# Patient Record
Sex: Female | Born: 1955
Health system: Southern US, Community
[De-identification: ages and names within clinical notes are randomized; demographics above are authoritative.]

## PROBLEM LIST (undated history)

## (undated) DIAGNOSIS — E739 Lactose intolerance, unspecified: Secondary | ICD-10-CM

## (undated) DIAGNOSIS — M069 Rheumatoid arthritis, unspecified: Secondary | ICD-10-CM

## (undated) DIAGNOSIS — M2550 Pain in unspecified joint: Secondary | ICD-10-CM

## (undated) DIAGNOSIS — K219 Gastro-esophageal reflux disease without esophagitis: Secondary | ICD-10-CM

## (undated) DIAGNOSIS — K5900 Constipation, unspecified: Secondary | ICD-10-CM

## (undated) DIAGNOSIS — I10 Essential (primary) hypertension: Secondary | ICD-10-CM

## (undated) DIAGNOSIS — M549 Dorsalgia, unspecified: Secondary | ICD-10-CM

## (undated) DIAGNOSIS — Z8489 Family history of other specified conditions: Secondary | ICD-10-CM

## (undated) DIAGNOSIS — M1990 Unspecified osteoarthritis, unspecified site: Secondary | ICD-10-CM

## (undated) DIAGNOSIS — R600 Localized edema: Secondary | ICD-10-CM

## (undated) DIAGNOSIS — M7989 Other specified soft tissue disorders: Secondary | ICD-10-CM

## (undated) DIAGNOSIS — Z91018 Allergy to other foods: Secondary | ICD-10-CM

## (undated) DIAGNOSIS — D649 Anemia, unspecified: Secondary | ICD-10-CM

## (undated) DIAGNOSIS — E559 Vitamin D deficiency, unspecified: Secondary | ICD-10-CM

## (undated) HISTORY — DX: Rheumatoid arthritis, unspecified: M06.9

## (undated) HISTORY — DX: Anemia, unspecified: D64.9

## (undated) HISTORY — DX: Unspecified osteoarthritis, unspecified site: M19.90

## (undated) HISTORY — DX: Allergy to other foods: Z91.018

## (undated) HISTORY — DX: Localized edema: R60.0

## (undated) HISTORY — DX: Constipation, unspecified: K59.00

## (undated) HISTORY — DX: Dorsalgia, unspecified: M54.9

## (undated) HISTORY — DX: Vitamin D deficiency, unspecified: E55.9

## (undated) HISTORY — DX: Pain in unspecified joint: M25.50

## (undated) HISTORY — DX: Lactose intolerance, unspecified: E73.9

## (undated) HISTORY — DX: Other specified soft tissue disorders: M79.89

## (undated) HISTORY — PX: FOOT SURGERY: SHX648

---

## 1997-03-25 HISTORY — PX: PARTIAL HYSTERECTOMY: SHX80

## 1998-04-20 ENCOUNTER — Ambulatory Visit (HOSPITAL_COMMUNITY): Admission: RE | Admit: 1998-04-20 | Discharge: 1998-04-20 | Payer: Self-pay | Admitting: Internal Medicine

## 1998-04-20 ENCOUNTER — Encounter: Payer: Self-pay | Admitting: Internal Medicine

## 2000-09-30 ENCOUNTER — Encounter: Admission: RE | Admit: 2000-09-30 | Discharge: 2000-12-29 | Payer: Self-pay | Admitting: Internal Medicine

## 2001-01-13 ENCOUNTER — Encounter: Payer: Self-pay | Admitting: Obstetrics and Gynecology

## 2001-01-13 ENCOUNTER — Ambulatory Visit (HOSPITAL_COMMUNITY): Admission: RE | Admit: 2001-01-13 | Discharge: 2001-01-13 | Payer: Self-pay | Admitting: Obstetrics and Gynecology

## 2001-02-02 ENCOUNTER — Encounter: Payer: Self-pay | Admitting: Internal Medicine

## 2001-02-02 ENCOUNTER — Ambulatory Visit (HOSPITAL_COMMUNITY): Admission: RE | Admit: 2001-02-02 | Discharge: 2001-02-02 | Payer: Self-pay | Admitting: Internal Medicine

## 2001-02-11 ENCOUNTER — Ambulatory Visit (HOSPITAL_COMMUNITY): Admission: RE | Admit: 2001-02-11 | Discharge: 2001-02-11 | Payer: Self-pay | Admitting: Internal Medicine

## 2001-02-11 ENCOUNTER — Encounter: Payer: Self-pay | Admitting: Internal Medicine

## 2001-03-10 ENCOUNTER — Encounter: Admission: RE | Admit: 2001-03-10 | Discharge: 2001-06-08 | Payer: Self-pay | Admitting: Internal Medicine

## 2001-06-04 ENCOUNTER — Emergency Department (HOSPITAL_COMMUNITY): Admission: EM | Admit: 2001-06-04 | Discharge: 2001-06-04 | Payer: Self-pay | Admitting: Emergency Medicine

## 2001-07-01 ENCOUNTER — Encounter: Payer: Self-pay | Admitting: Neurosurgery

## 2001-07-01 ENCOUNTER — Encounter: Admission: RE | Admit: 2001-07-01 | Discharge: 2001-07-01 | Payer: Self-pay | Admitting: Neurosurgery

## 2001-07-15 ENCOUNTER — Encounter: Admission: RE | Admit: 2001-07-15 | Discharge: 2001-07-15 | Payer: Self-pay | Admitting: Neurosurgery

## 2001-07-15 ENCOUNTER — Encounter: Payer: Self-pay | Admitting: Neurosurgery

## 2001-12-01 ENCOUNTER — Ambulatory Visit (HOSPITAL_COMMUNITY): Admission: RE | Admit: 2001-12-01 | Discharge: 2001-12-01 | Payer: Self-pay | Admitting: Internal Medicine

## 2003-07-15 ENCOUNTER — Ambulatory Visit (HOSPITAL_COMMUNITY): Admission: RE | Admit: 2003-07-15 | Discharge: 2003-07-15 | Payer: Self-pay | Admitting: Obstetrics and Gynecology

## 2004-01-27 ENCOUNTER — Encounter: Admission: RE | Admit: 2004-01-27 | Discharge: 2004-01-27 | Payer: Self-pay | Admitting: Neurosurgery

## 2004-09-09 ENCOUNTER — Emergency Department (HOSPITAL_COMMUNITY): Admission: EM | Admit: 2004-09-09 | Discharge: 2004-09-09 | Payer: Self-pay | Admitting: Emergency Medicine

## 2006-11-18 ENCOUNTER — Encounter: Admission: RE | Admit: 2006-11-18 | Discharge: 2006-11-18 | Payer: Self-pay | Admitting: Internal Medicine

## 2007-02-19 ENCOUNTER — Emergency Department (HOSPITAL_COMMUNITY): Admission: EM | Admit: 2007-02-19 | Discharge: 2007-02-20 | Payer: Self-pay | Admitting: Emergency Medicine

## 2007-02-25 ENCOUNTER — Ambulatory Visit (HOSPITAL_COMMUNITY): Admission: RE | Admit: 2007-02-25 | Discharge: 2007-02-25 | Payer: Self-pay | Admitting: Internal Medicine

## 2007-07-17 ENCOUNTER — Ambulatory Visit (HOSPITAL_COMMUNITY): Admission: RE | Admit: 2007-07-17 | Discharge: 2007-07-17 | Payer: Self-pay | Admitting: Surgery

## 2009-08-11 ENCOUNTER — Ambulatory Visit (HOSPITAL_COMMUNITY): Admission: RE | Admit: 2009-08-11 | Discharge: 2009-08-11 | Payer: Self-pay | Admitting: Internal Medicine

## 2010-04-14 ENCOUNTER — Encounter: Payer: Self-pay | Admitting: Orthopaedic Surgery

## 2010-04-15 ENCOUNTER — Encounter: Payer: Self-pay | Admitting: Internal Medicine

## 2010-06-19 ENCOUNTER — Ambulatory Visit (HOSPITAL_COMMUNITY): Payer: Self-pay | Admitting: Physical Therapy

## 2010-08-10 NOTE — Procedures (Signed)
[facility]  Patient:    Whitney Bauer, Whitney Bauer. Visit Number: [id number] MRN: [id number]          Service Type: Attending:  Carylon Perches, M.D. Dictated by:   Carylon Perches, M.D.                                Stress Test  DESCRIPTION OF PROCEDURE:  The patient exercised 4 minutes 54 seconds (1 minute 54 seconds into stage 2 of the Bruce protocol), attaining a maximal heart rate of 181 (102% of the age-predicted maximal heart rate) at a work load of 7 METs and discontinued exercise due to shortness of breath and fatigue.  There were no symptoms of chest pain.  There were no arrhythmias. There were no ST segment changes diagnostic of ischemia.  The baseline electrocardiogram revealed normal sinus rhythm and poor R wave progression.  IMPRESSION: 1. No evidence of exercise-induced ischemia. 2. Poor exercise tolerance. 3. Cardiolite images pending. Dictated by:   Carylon Perches, M.D. Attending:  Carylon Perches, M.D. DD:  02/11/01 TD:  02/11/01 Job: [id number]

## 2010-08-29 ENCOUNTER — Other Ambulatory Visit (HOSPITAL_COMMUNITY): Payer: Self-pay | Admitting: Internal Medicine

## 2010-08-29 ENCOUNTER — Ambulatory Visit (HOSPITAL_COMMUNITY)
Admission: RE | Admit: 2010-08-29 | Discharge: 2010-08-29 | Disposition: A | Discharge status: Home / Self Care | Payer: PRIVATE HEALTH INSURANCE | Source: Ambulatory Visit | Attending: Internal Medicine | Admitting: Internal Medicine

## 2010-08-29 ENCOUNTER — Encounter (HOSPITAL_COMMUNITY): Payer: Self-pay

## 2010-08-29 DIAGNOSIS — I10 Essential (primary) hypertension: Secondary | ICD-10-CM | POA: Insufficient documentation

## 2010-08-29 DIAGNOSIS — R0602 Shortness of breath: Secondary | ICD-10-CM | POA: Insufficient documentation

## 2010-08-29 HISTORY — DX: Essential (primary) hypertension: I10

## 2010-10-23 ENCOUNTER — Ambulatory Visit (HOSPITAL_COMMUNITY)
Admission: RE | Admit: 2010-10-23 | Discharge: 2010-10-23 | Disposition: A | Discharge status: Home / Self Care | Payer: PRIVATE HEALTH INSURANCE | Source: Ambulatory Visit | Attending: Internal Medicine | Admitting: Internal Medicine

## 2010-10-23 DIAGNOSIS — M6281 Muscle weakness (generalized): Secondary | ICD-10-CM | POA: Insufficient documentation

## 2010-10-23 DIAGNOSIS — M25569 Pain in unspecified knee: Secondary | ICD-10-CM | POA: Insufficient documentation

## 2010-10-23 DIAGNOSIS — R262 Difficulty in walking, not elsewhere classified: Secondary | ICD-10-CM | POA: Insufficient documentation

## 2010-10-23 DIAGNOSIS — IMO0001 Reserved for inherently not codable concepts without codable children: Secondary | ICD-10-CM | POA: Insufficient documentation

## 2010-10-23 NOTE — Progress Notes (Signed)
Physical Therapy Evaluation  Patient Name: Whitney Bauer Date: 10/23/2010 HPI: Pt with OA B knees that is interfering with her ADL's.  Pt has LE weakness B. Symptoms/Limitations Symptoms: I can not get into high places.  My left leg feels like it wants to give out every once in a while.  I have more pain in the right knee on the inside.  Pt states that she has had the weakness for a long time but the pain has just begun.  Pt states that she is ok walkint on level ground but if she goes on an incline than she can has increased pain.   How long can you stand comfortably?: twenty minutes anything over this her knees feel like her knees are going to give out. How long can you walk comfortably?: If it is level she can walk alright but any incline or steps the patient has problelms. Repetition: Increases Symptoms Pain Assessment Currently in Pain?: Yes Pain Score:   7 Pain Location: Knee Pain Orientation: Right Pain Type: Chronic pain Pain Onset: More than a month ago Pain Frequency: Intermittent Effect of Pain on Daily Activities: increases Multiple Pain Sites: Yes Past Medical History:  Past Medical History  Diagnosis Date  . Hypertension    Past Surgical History: No past surgical history on file.  Precautions/Restrictions     Prior Functioning  Home Living Type of Home: House Lives With: Alone Bathroom Shower/Tub: Tub/shower unit Additional Comments:  (unable to go up and down steps reciprocally.) Prior Function Level of Independence: Independent with basic ADLs Driving: Yes Able to Take Stairs Reciprically: No Vocation: Full time employment Leisure: Hobbies-yes (Comment) Comments: shopping she does not do this like she did.  Cognition Cognition Overall Cognitive Status: Appears within functional limits for tasks assessed Arousal/Alertness: Awake/alert Orientation Level: Oriented X4  Sensation/Coordination/Flexibility    Assessment RLE AROM (degrees) Right  Knee Extension 0-130: 3  Right Knee Flexion 0-140: 105  RLE Strength Right Hip Flexion: 2+/5 Right Hip Extension: 2+/5 Right Hip ABduction: 2/5 Right Hip ADduction: 2/5 Right Knee Flexion: 4/5 Right Knee Extension: 3+/5 LLE AROM (degrees) Left Knee Extension 0-130: 5  Left Knee Flexion 0-140: 102  LLE Strength Left Hip Flexion: 2/5 Left Hip Extension: 2+/5 Left Hip ABduction: 2/5 Left Hip ADduction: 2/5 Left Knee Flexion: 5/5 Left Knee Extension: 3/5  Mobility (including Balance)       Exercise/Treatments Hip Exercises Straight Leg Raises: Both;10 reps (knees bent to 40 degrees.) Bridges: 10 reps Knee Exercises Quad Sets: Both;10 reps Knee Extension: Both;10 reps;Seated Straight Leg Raises: Both;10 reps (knees bent to 40 degrees.) Hip ABduction: 10 reps;Sidelying;Both Bridges: 10 reps    Goals PT Short Term Goals Short Term Goal 1: I hep Short Term Goal 2: strengthen LE 1/2 grade to allow ease of coming from sit to stand. PT Long Term Goals Long Term Goal 1: I HEP-advancec Long Term Goal 2: strength increased to 4/5 to allow walking up inclines and steps Long Term Goal 3: Pain no greater than a 2/10 End of Session Patient Active Problem List  Diagnoses  . Muscle weakness (generalized)   PT - End of Session Activity Tolerance: Patient tolerated treatment well General Behavior During Session: WFL for tasks performed Cognition: [facility] for tasks performed PT Assessment and Plan Clinical Impression Statement: Patient with overall weakness causing difficulty walking,increased pain and difficulty with steps who will benefit from skilled physical therapy to maximize functional potential. Rehab Potential: Good PT Frequency: Min 2X/week PT Duration:  4 weeks PT Treatment/Interventions: Therapeutic exercise;Patient/family education PT Plan: see 2x week; add standing squats, heel raises, terminal extension and lateral step up next rx.  Time Calculation Start Time:  0805 Stop Time: 0850 Time Calculation (min): 45 min  RUSSELL,CINDY 10/23/2010, 8:54 AM

## 2010-10-23 NOTE — Patient Instructions (Signed)
HEP

## 2010-10-25 ENCOUNTER — Ambulatory Visit (HOSPITAL_COMMUNITY)
Admission: RE | Admit: 2010-10-25 | Discharge: 2010-10-25 | Disposition: A | Discharge status: Home / Self Care | Payer: PRIVATE HEALTH INSURANCE | Source: Ambulatory Visit | Attending: Internal Medicine | Admitting: Internal Medicine

## 2010-10-25 DIAGNOSIS — M6281 Muscle weakness (generalized): Secondary | ICD-10-CM | POA: Insufficient documentation

## 2010-10-25 DIAGNOSIS — IMO0001 Reserved for inherently not codable concepts without codable children: Secondary | ICD-10-CM | POA: Insufficient documentation

## 2010-10-25 DIAGNOSIS — M25569 Pain in unspecified knee: Secondary | ICD-10-CM | POA: Insufficient documentation

## 2010-10-25 DIAGNOSIS — R262 Difficulty in walking, not elsewhere classified: Secondary | ICD-10-CM | POA: Insufficient documentation

## 2010-10-25 NOTE — Progress Notes (Signed)
Physical Therapy Treatment Patient Name: Whitney Bauer Date: 10/25/2010  Visit #:2/2 Time in: 10:28 Time out: 10:56 Initial Evaluation Date: 10/23/10 Charges: Therex  x 25'   HPI: Symptoms/Limitations Symptoms: Sore today. Pain Assessment Currently in Pain?: Yes Pain Score:   6 Pain Location: Knee Pain Orientation: Right;Left   Exercise/Treatments  Standing: Knee Extension: 10 reps;Both;Standing (TKE w/ blue band) Lateral Step Up: 10 reps;Step Height: 4" Functional Squat: 10 reps;15 reps Heel Raises: 10 reps  Seated: Long Arc Quad: 10 reps;Right;Left;Seated  Supine: Straight Leg Raises: 10 reps;Both Bridges: 10 reps [employer]: 10 reps;Both  Side lying: Hip ABduction: 10 reps;Sidelying;Both      Goals PT Short Term Goals Short Term Goal 1: I hep Short Term Goal 1 Progress: Progressing toward goal Short Term Goal 2: strengthen LE 1/2 grade to allow ease of coming from sit to stand. Short Term Goal 2 Progress: Progressing toward goal PT Long Term Goals Long Term Goal 1: I HEP-advancec Long Term Goal 1 Progress: Progressing toward goal Long Term Goal 2: strength increased to 4/5 to allow walking up inclines and steps Long Term Goal 2 Progress: Progressing toward goal Long Term Goal 3: Pain no greater than a 2/10 Long Term Goal 3 Progress: Progressing toward goal End of Session Patient Active Problem List  Diagnoses  . Muscle weakness (generalized)   PT - End of Session Activity Tolerance: Patient tolerated treatment well General Behavior During Session: [facility] for tasks performed Cognition: [facility] for tasks performed PT Assessment and Plan Clinical Impression Statement: Began new therex with minimal need for queing for proper form. Pt requires VC/TC sith side lying abd to avoid external rotation of hip. PT Treatment/Interventions: Therapeutic exercise PT Plan: Continue to progress strength per PT POC.  Seth Bake [facility] 10/25/2010, 11:03 AM

## 2010-10-30 ENCOUNTER — Ambulatory Visit (HOSPITAL_COMMUNITY)
Admission: RE | Admit: 2010-10-30 | Discharge: 2010-10-30 | Disposition: A | Discharge status: Home / Self Care | Payer: PRIVATE HEALTH INSURANCE | Source: Ambulatory Visit | Attending: Internal Medicine | Admitting: Internal Medicine

## 2010-10-30 NOTE — Progress Notes (Signed)
Physical Therapy Treatment Patient Name: Whitney Bauer [id number] Date: 10/30/2010  HPI: Knee pain.  Visit 3/3 Symptoms/Limitations Symptoms: The pain is in the back ot the knee on the right Pain Assessment Currently in Pain?: Yes Pain Score:   7 Pain Location: Knee Pain Orientation: Right Pain Onset: More than a month ago  Precautions/Restrictions     Mobility (including Balance)       Exercise/Treatments Knee Stretches Active Hamstring Stretch: 5 reps;10 seconds Knee Exercises Quad Sets:  (TKE 15 x R) Knee Extension: Strengthening;Right;15 reps;Seated (3#) Heel Raises: 15 reps Hip Extension: 15 reps;Other (comment) (prone) Hamstring Curl: 15 reps (standing and prone with 3#) Hip ABduction: Right;Sidelying;10 reps Hip ADduction: Right;10 reps;Sidelying Additional Knee Exercises Forward Lunge:  (standing TKE w/t-band 10x) Lateral Step Up: Step Height: 4";15 reps Functional Squat: 10 reps Ankle Exercises Heel Raises: 15 reps Balance Exercises Stationary Bike: 7'@1 .0 Heel Raises: 15 reps    Goals   End of Session Patient Active Problem List  Diagnoses  . Muscle weakness (generalized)   PT - End of Session Activity Tolerance: Patient tolerated treatment well General Behavior During Session: [facility] for tasks performed PT Assessment and Plan Clinical Impression Statement: Pt states R knee pain is the same.  Added prone exercises to routine. Rehab Potential: Good PT Frequency: Min 2X/week PT Duration: 4 weeks PT Treatment/Interventions: Therapeutic exercise PT Plan: Begin SLS and rockerboard next treatment.  RUSSELL,CINDY 10/30/2010, 11:21 AM

## 2010-11-01 ENCOUNTER — Ambulatory Visit (HOSPITAL_COMMUNITY)
Admission: RE | Admit: 2010-11-01 | Discharge: 2010-11-01 | Disposition: A | Discharge status: Home / Self Care | Payer: PRIVATE HEALTH INSURANCE | Source: Ambulatory Visit | Attending: *Deleted | Admitting: *Deleted

## 2010-11-01 NOTE — Progress Notes (Addendum)
Physical Therapy Treatment Patient Name: Whitney Bauer Date: 11/01/2010  Visit # :4/4  Time in: 11:29 Time out: 12:02 Initial Evaluation Date: 10/23/10  Charges: Therex x 29'  (Pt.'s treatment began by Donnamae Jude, PT at 11:11am, so may have additional charges)  SUBJECTIVE: Symptoms/Limitations Symptoms: My pain is in my left knee today my right is not hurting. Pain Assessment Currently in Pain?: Yes Pain Score:   5 Pain Location: Knee Pain Orientation: Left  Precautions/Restrictions:  None noted          Exercise/Treatments Recumbent Bike 7 minutes @ 3.0, seat 12 Standing:  TKE w/ blue band 15X  Lateral Step Up: 15 reps;Step Height: 4" Forward step up 15X each, 4" step  Functional Squat: 15 reps;15 reps  Heel Raises: 15 reps  Rockerboard 2 minutes SLS: Left 4", Right 10" Seated:  Long Arc Quad: 15 reps;Right;Left;Seated Prone: Hip extension, bilateral 15X Hamstring curls, bilateral 15X  Supine:  Straight Leg Raises: 15 reps;Both  Bridges: 15 reps  Hamstring Stretch, bilaterally 5X10" each Side lying:  Hip ABduction: 15 X bilateral Hip Adduction 15X bilateral       End of Session Patient Active Problem List  Diagnoses  . Muscle weakness (generalized)   PT - End of Session Activity Tolerance: Patient tolerated treatment well General Behavior During Session: [facility] for tasks performed Cognition: [facility] for tasks performed PT Assessment and Plan Clinical Impression Statement: Pt able to complete all exercises without difficulty.  Added SLS and rockerboard to POC. PT Treatment/Interventions: Therapeutic exercise PT Plan: Progress strength and stability per POC.  Bascom Levels, Nassir Neidert B 11/01/2010, 12:09 PM

## 2010-11-06 ENCOUNTER — Ambulatory Visit (HOSPITAL_COMMUNITY): Payer: PRIVATE HEALTH INSURANCE | Admitting: Physical Therapy

## 2010-11-08 ENCOUNTER — Ambulatory Visit (HOSPITAL_COMMUNITY)
Admission: RE | Admit: 2010-11-08 | Discharge: 2010-11-08 | Disposition: A | Discharge status: Home / Self Care | Payer: PRIVATE HEALTH INSURANCE | Source: Ambulatory Visit | Attending: *Deleted | Admitting: *Deleted

## 2010-11-08 NOTE — Progress Notes (Signed)
Physical Therapy Treatment Patient Details  Name: Whitney Bauer MRN: [id number] Date of Birth: October 18, 1955  Today's Date: 11/08/2010 Time: 1020-1118 Time Calculation (min): 58 min Visit#: 5 of 5 Re-eval: 11/23/10 Charges: Therex x 48'  Subjective: Symptoms/Limitations Symptoms: Pain free just stiffness this morning.  Pt requested hold on bike secondary to pain on bottom of feet and knees.   Exercise/Treatments Aerobic: TM at 5' @ .8   Standing: TKE w/ blue band 15X (B) Lateral Step Up: 20 reps;Step Height: 4"  (B) Forward step up 20X each, 4" step (B) Functional Squat: 15 reps Heel Raises: 20 reps  Rockerboard 2 minutes  SLS: Left 3", Right 21"   Seated:  Long Arc Quad: 15 reps;Right;Left;Seated   Prone:  Hip extension, bilateral 15X  Hamstring curls, bilateral 15X (3#)  Supine:  Straight Leg Raises: 15 reps (pt only able to complete 12 on LLE) Bridges: 15 reps    Side lying:  Hip ABduction: 15 X bilateral  Hip Adduction 15X bilateral  Physical Therapy Assessment and Plan PT Assessment and Plan Clinical Impression Statement: Pt with decreased muscle strength/endurance with SLR in supine and prone hip extension. Pt requires VC for proper technique with rocker board and functional squats. RLE stability increased , pt able to hold R SLS for 21" this tx (10" last tx) LLE balance/stability continues to be impaired.Rec bike held secondary to increased pain after last tx. PT Treatment/Interventions: Therapeutic exercise PT Plan: Continue to progress strength/stability per PT POC. Increase reps/wt next tx.    Goals PT Short Term Goals PT Short Term Goal 1: I hep PT Short Term Goal 1 - Progress: Met PT Short Term Goal 2: strengthen LE 1/2 grade to allow ease of coming from sit to stand. PT Short Term Goal 2 - Progress: Progressing toward goal PT Long Term Goals PT Long Term Goal 1: I HEP-advancec PT Long Term Goal 1 - Progress: Progressing toward goal PT Long Term Goal  2: strength increased to 4/5 to allow walking up inclines and steps PT Long Term Goal 2 - Progress: Progressing toward goal Long Term Goal 3: Pain no greater than a 2/10 Long Term Goal 3 Progress: Progressing toward goal  Problem List Patient Active Problem List  Diagnoses  . Muscle weakness (generalized)    PT - End of Session Activity Tolerance: Patient tolerated treatment well General Behavior During Session: [facility] for tasks performed Cognition: [facility] for tasks performed  Seth Bake Leah/ Becky Sax, PTA 11/08/2010, 11:35 AM

## 2010-11-13 ENCOUNTER — Ambulatory Visit (HOSPITAL_COMMUNITY): Payer: PRIVATE HEALTH INSURANCE | Admitting: *Deleted

## 2010-11-15 ENCOUNTER — Ambulatory Visit (HOSPITAL_COMMUNITY)
Admission: RE | Admit: 2010-11-15 | Discharge: 2010-11-15 | Disposition: A | Discharge status: Home / Self Care | Payer: PRIVATE HEALTH INSURANCE | Source: Ambulatory Visit | Attending: *Deleted | Admitting: *Deleted

## 2010-11-15 NOTE — Progress Notes (Signed)
Physical Therapy Treatment Patient Details  Name: Whitney Bauer MRN: [id number] Date of Birth: [birth year]  Today's Date: 11/15/2010 Time: 4098-1191 Time Calculation (min): 55 min Visit#: 6 Re-eval: 11/23/10 Charges: Therex x 45'  Subjective: Symptoms/Limitations Symptoms: I feel excellent today! Pain Assessment Currently in Pain?: No/denies   Exercise/Treatments  Aerobic: TM at 5' @ .8  Standing:  TKE w/ blue band 15X (B)  Lateral Step Up: 20 reps;Step Height: 4" (B)  Forward step up 20X each, 4" step (B)  Functional Squat: 20 reps  Heel Raises: 20 reps  Rockerboard 2 minutes  SLS: Left 4", Right 15"  Seated:  Long Arc Quad: 20reps;Right;Left;Seated  Prone:  Hip extension, bilateral 15X  Hamstring curls, bilateral 15X (3#)  Supine:  Straight Leg Raises: 15 reps (pt only able to complete 12 on LLE)  Bridges: 20 reps  Side lying:  Hip ABduction: 15 X bilateral  Hip Adduction 15X bilateral  Physical Therapy Assessment and Plan PT Assessment and Plan Clinical Impression Statement: Pt tolerates increased reps with therex without difficulty. Pt completes therex without c/o increased pain. Pt requires VC's to avoid hip dropping with step ups to compensate for decreased quad control/  PT Treatment/Interventions: Therapeutic exercise PT Plan: Continue to progress per PT POC.    Goals PT Short Term Goals PT Short Term Goal 1: I hep PT Short Term Goal 1 - Progress: Met PT Short Term Goal 2: strengthen LE 1/2 grade to allow ease of coming from sit to stand. PT Short Term Goal 2 - Progress: Progressing toward goal PT Long Term Goals PT Long Term Goal 1: I HEP-advancec PT Long Term Goal 1 - Progress: Progressing toward goal PT Long Term Goal 2: strength increased to 4/5 to allow walking up inclines and steps PT Long Term Goal 2 - Progress: Progressing toward goal Long Term Goal 3: Pain no greater than a 2/10 Long Term Goal 3 Progress: Progressing toward goal  Problem  List Patient Active Problem List  Diagnoses  . Muscle weakness (generalized)    PT - End of Session Activity Tolerance: Patient tolerated treatment well General Behavior During Session: [facility] for tasks performed Cognition: [facility] for tasks performed  Antonieta Iba 11/15/2010, 12:03 PM

## 2010-11-20 ENCOUNTER — Ambulatory Visit (HOSPITAL_COMMUNITY)
Admission: RE | Admit: 2010-11-20 | Discharge: 2010-11-20 | Disposition: A | Discharge status: Home / Self Care | Payer: PRIVATE HEALTH INSURANCE | Source: Ambulatory Visit | Attending: *Deleted | Admitting: *Deleted

## 2010-11-20 NOTE — Progress Notes (Signed)
Physical Therapy Treatment Patient Details  Name: Whitney Bauer MRN: [id number] Date of Birth: 03/06/1956  Today's Date: 11/20/2010 Time: 1340-1430 Time Calculation (min): 50 min Visit#: 7 Re-eval: 11/23/10 (Next tx) Charges: Therex x 45'  Subjective: Symptoms/Limitations Symptoms: "I feel a little short winded today." Pain Assessment Currently in Pain?: No/denies   Exercise/Treatments Standing:  Lateral Step Up: 20 reps;Step Height: 4" (B)  Forward step up 20X each, 4" step (B)  Functional Squat: 20 reps  Heel Raises: 20 reps  Rockerboard 2 minutes  SLS: Left 5", Right 8"  Seated:  Long Arc Quad: 20reps;Right;Left;Seated  Prone:  Hip extension, bilateral 20X  Hamstring curls, bilateral 20X (3#)  Supine:  Straight Leg Raises: 15 reps  Bridges: 20 reps  Side lying:  Hip ABduction: 20 X bilateral  Hip Adduction 20X bilateral  Physical Therapy Assessment and Plan PT Assessment and Plan Clinical Impression Statement: Pt displays increased LE strength with therx. Began step downs to increase eccentric quad conrtol. Pt with decreased quad control R>L. PT Treatment/Interventions: Therapeutic exercise PT Plan: Reassess next x.     Problem List Patient Active Problem List  Diagnoses  . Muscle weakness (generalized)    PT - End of Session Activity Tolerance: Patient tolerated treatment well General Behavior During Session: [facility] for tasks performed Cognition: [facility] for tasks performed  Antonieta Iba 11/20/2010, 2:49 PM

## 2010-11-23 ENCOUNTER — Ambulatory Visit (HOSPITAL_COMMUNITY): Payer: PRIVATE HEALTH INSURANCE | Admitting: Physical Therapy

## 2010-11-28 ENCOUNTER — Ambulatory Visit (HOSPITAL_COMMUNITY)
Admission: RE | Admit: 2010-11-28 | Discharge: 2010-11-28 | Disposition: A | Discharge status: Home / Self Care | Payer: PRIVATE HEALTH INSURANCE | Source: Ambulatory Visit | Attending: Internal Medicine | Admitting: Internal Medicine

## 2010-11-28 DIAGNOSIS — M25569 Pain in unspecified knee: Secondary | ICD-10-CM | POA: Insufficient documentation

## 2010-11-28 DIAGNOSIS — M6281 Muscle weakness (generalized): Secondary | ICD-10-CM | POA: Insufficient documentation

## 2010-11-28 DIAGNOSIS — R262 Difficulty in walking, not elsewhere classified: Secondary | ICD-10-CM | POA: Insufficient documentation

## 2010-11-28 DIAGNOSIS — IMO0001 Reserved for inherently not codable concepts without codable children: Secondary | ICD-10-CM | POA: Insufficient documentation

## 2010-11-28 NOTE — Progress Notes (Signed)
Physical Therapy Discharge Note  Patient Details  Name: Whitney Bauer MRN: [id number] Date of Birth: 1955-09-01  Today's Date: 11/28/2010 Time: 7829-5621 Time Calculation (min): 40 min Charges: 1 MMT, 25' TE Visit#: 8 since 10/23/10 Re-eval:    Past Medical History:  Past Medical History  Diagnosis Date  . Hypertension    Past Surgical History: No past surgical history on file.  Subjective Symptoms/Limitations Symptoms: The knee pain is a whole lot better.  Between 50-80% better, I am still getting winded.  She reports she was sitting at a picnic table and her knees were bent back for an extended period of time and needed her cane to walk back to the car.  Overall she reports she can feel a big difference and is doing much better. Normal pain range is about a 3/10.  How long can you stand comfortably?: 20 minutes How long can you walk comfortably?: inclines are not difficult and cannot tell a difference with step climbing.  Pain Assessment Pain Score:   5 Pain Location: Knee Pain Orientation: Left Assessment RLE Strength Right Hip Flexion: 4/5 Right Hip Extension: 3+/5 Right Hip ABduction: 5/5 Right Hip ADduction: 4/5 Right Knee Flexion: 5/5 Right Knee Extension: 5/5 LLE Strength Left Hip Flexion: 4/5 Left Hip Extension: 3+/5 Left Hip ABduction: 4/5 Left Hip ADduction: 4/5 Left Knee Flexion: 5/5 Left Knee Extension: 5/5   Exercise/Treatments Reviewed and updated HEP Stretches Gastroc Stretch: 3 reps;30 seconds;Limitations Gastroc Stretch Limitations: BLE   Standing Heel Raises: 20 reps Lateral Step Up: 20 reps;Step Height: 4";Both Forward Step Up: 20 reps;Step Height: 4";Both Step Down: Both;20 reps;Hand Hold: 1;Step Height: 2" Functional Squat: 20 reps SLS: L SLS 3x30 sec, Best Time:  Sidelying Hip ADduction: Left;10 reps;Limitations Hip ADduction Limitations: Educated on alternate techniques  Physical Therapy Assessment and Plan PT Assessment and  Plan Clinical Impression Statement: Pt was referred to PT secondary to bilateral knee pain.  After 4 weeks of PT she has met 2 of 2 STG and 1 of 3 LTG and partly met 2 of 3 LTG.  Pt has improved her strength, activity tolerance, independent ambulation and improved percieved ability, hower continues to be limited by moderate pain, balance and difficulty with functional mobility including going up and down stairs.  Pt will continue to benefit from her advanced HEP to continue with functional strengthening.  PT Plan: D/C w/advanced HEP    Goals PT Short Term Goals PT Short Term Goal 1: I hep PT Short Term Goal 1 - Progress: Met PT Short Term Goal 2: strengthen LE 1/2 grade to allow ease of coming from sit to stand. PT Short Term Goal 2 - Progress: Met PT Long Term Goals PT Long Term Goal 1: I HEP-advancec PT Long Term Goal 1 - Progress: Met PT Long Term Goal 2: strength increased to 4/5 to allow walking up inclines and steps PT Long Term Goal 2 - Progress: Partly met Long Term Goal 3: Pain no greater than a 2/10 Long Term Goal 3 Progress: Partly met  Problem List Patient Active Problem List  Diagnoses  . Muscle weakness (generalized)        Whitney Bauer 11/28/2010, 10:17 AM  Physician Documentation Your signature is required to indicate approval of the treatment plan as stated above.  Please sign and either send electronically or make a copy of this report for your files and return this physician signed original.   Please mark one 1.__approve of plan  2. ___approve of plan with  the following conditions.   ______________________________                                                          _____________________ Physician Signature                                                                                                             Date

## 2010-12-28 ENCOUNTER — Other Ambulatory Visit (HOSPITAL_COMMUNITY): Payer: Self-pay | Admitting: Internal Medicine

## 2010-12-28 DIAGNOSIS — Z139 Encounter for screening, unspecified: Secondary | ICD-10-CM

## 2011-01-01 ENCOUNTER — Ambulatory Visit (HOSPITAL_COMMUNITY)
Admission: RE | Admit: 2011-01-01 | Discharge: 2011-01-01 | Disposition: A | Discharge status: Home / Self Care | Payer: PRIVATE HEALTH INSURANCE | Source: Ambulatory Visit | Attending: Internal Medicine | Admitting: Internal Medicine

## 2011-01-01 DIAGNOSIS — Z139 Encounter for screening, unspecified: Secondary | ICD-10-CM

## 2011-01-01 DIAGNOSIS — Z1231 Encounter for screening mammogram for malignant neoplasm of breast: Secondary | ICD-10-CM | POA: Insufficient documentation

## 2011-01-01 LAB — CBC
HCT: 36.9
Hemoglobin: 12.4
MCV: 82.4
Platelets: 197
RBC: 4.48

## 2011-01-01 LAB — COMPREHENSIVE METABOLIC PANEL
BUN: 9
CO2: 30
Creatinine, Ser: 0.68
GFR calc Af Amer: >60
GFR calc non Af Amer: >60
Sodium: 133 — ABNORMAL LOW
Total Protein: 6.2

## 2011-01-01 LAB — DIFFERENTIAL
Basophils Relative: 1
Eosinophils Absolute: 0.1 — ABNORMAL LOW
Lymphs Abs: 1.3
Monocytes Absolute: 0.5
Monocytes Relative: 11
Neutro Abs: 3
Neutrophils Relative %: 59

## 2011-01-28 ENCOUNTER — Ambulatory Visit (INDEPENDENT_AMBULATORY_CARE_PROVIDER_SITE_OTHER): Payer: PRIVATE HEALTH INSURANCE | Admitting: Pulmonary Disease

## 2011-01-28 ENCOUNTER — Encounter: Payer: Self-pay | Admitting: Pulmonary Disease

## 2011-01-28 VITALS — BP 130/96 | HR 76 | Temp 98.2°F | Ht 65.5 in | Wt 384.2 lb

## 2011-01-28 DIAGNOSIS — R0609 Other forms of dyspnea: Secondary | ICD-10-CM

## 2011-01-28 NOTE — Progress Notes (Signed)
  Subjective:    Patient ID: Whitney Bauer, female    DOB: [birth year], 55 y.o.   MRN: [id number]  HPI The patient is a 55 year old female who I've been asked to see for dyspnea on exertion.  She has had shortness of breath for many years, but has been intermittent in the past.  Since September she has noticed worsening of her symptoms, and they are much more consistent.  She has noted shortness or breath when lying flat, or with exertional activities.  She describes classic upper airway pseudo-wheezing with heavy exertion.  She describes less than one block dyspnea on exertion on flat ground, and can get winded walking room to room.  She will get winded bringing groceries in from the car.  She has no significant cough or mucus production, but has had issues with lower extremity edema.  The patient has never smoked, and has no history of asthma.  She does have a history of hypertension, and a chest x-ray in June showed cardiomegaly and prominent pulmonary arteries at the hila.  She has had a recent d-dimer that was normal, and a BNP of only 83.  She has been started on a diuretic by Dr. Zenon Mayo is not taking it consistently.   Review of Systems  Constitutional: Negative for fever and unexpected weight change.  HENT: Positive for dental problem. Negative for ear pain, nosebleeds, congestion, sore throat, rhinorrhea, sneezing, trouble swallowing, postnasal drip and sinus pressure.   Eyes: Negative for redness and itching.  Respiratory: Positive for shortness of breath. Negative for cough, chest tightness and wheezing.   Cardiovascular: Positive for leg swelling. Negative for palpitations.  Gastrointestinal: Negative for nausea and vomiting.  Genitourinary: Negative for dysuria.  Musculoskeletal: Positive for joint swelling.  Skin: Negative for rash.  Neurological: Negative for headaches.  Hematological: Does not bruise/bleed easily.  Psychiatric/Behavioral: Negative for dysphoric mood. The patient  is not nervous/anxious.        Objective:   Physical Exam Constitutional: morbidly obese, no acute distress  HENT:  Nares patent without discharge  Oropharynx without exudate, palate and uvula are elongated.   Eyes:  Perrla, eomi, no scleral icterus  Neck:  No JVD, no TMG  Cardiovascular:  Normal rate, regular rhythm, no rubs or gallops.  2/6 sem        Intact distal pulses  Pulmonary :  Normal breath sounds, no stridor or respiratory distress   No rales, rhonchi, or wheezing  Abdominal:  Soft, nondistended, bowel sounds present.  No tenderness noted.   Musculoskeletal: 1+lower extremity edema noted.  Lymph Nodes:  No cervical lymphadenopathy noted  Skin:  No cyanosis noted  Neurologic:  Alert, appropriate, moves all 4 extremities without obvious deficit.         Assessment & Plan:

## 2011-01-28 NOTE — Patient Instructions (Signed)
Work on weight reduction and conditioning Get on your fluid medication regularly as prescribed by Dr. Ouida Sills.  If you do not feel things are improved in 4 weeks, can do breathing studies here and consider a sound wave test of your heart.

## 2011-01-28 NOTE — Assessment & Plan Note (Signed)
The patient has had dyspnea on exertion for years, but has noticed a worsening since September.  I suspect the majority of this is due to her morbid obesity, debility, and deconditioning.  However, with her underlying hypertension, and also cardiomegaly with prominent vasculature at the hila, I suspect diastolic dysfunction is also playing a role.  The patient is not taking her diuretic therapy compliantly, and I have stressed the importance of this.  There is nothing by history or exam to suggest asthma, but I have offered to do pulmonary function studies for evaluation.  If this is unremarkable, could consider doing an echocardiogram.  After a long discussion, the patient and I have agreed to let her work on conservative therapy before doing further testing.  She will take her diuretic as prescribed by Dr. Ouida Sills, and will work on weight loss and improving mobility.  If she is not seeing some kind of improvement in 4 weeks, would consider doing PFTs and echo for completeness.

## 2011-04-15 ENCOUNTER — Emergency Department (HOSPITAL_COMMUNITY)
Admission: EM | Admit: 2011-04-15 | Discharge: 2011-04-15 | Disposition: A | Discharge status: Home / Self Care | Payer: PRIVATE HEALTH INSURANCE | Attending: Emergency Medicine | Admitting: Emergency Medicine

## 2011-04-15 ENCOUNTER — Emergency Department (HOSPITAL_COMMUNITY): Payer: PRIVATE HEALTH INSURANCE

## 2011-04-15 ENCOUNTER — Encounter (HOSPITAL_COMMUNITY): Payer: Self-pay | Admitting: *Deleted

## 2011-04-15 DIAGNOSIS — Z9079 Acquired absence of other genital organ(s): Secondary | ICD-10-CM | POA: Insufficient documentation

## 2011-04-15 DIAGNOSIS — R109 Unspecified abdominal pain: Secondary | ICD-10-CM | POA: Insufficient documentation

## 2011-04-15 DIAGNOSIS — M1710 Unilateral primary osteoarthritis, unspecified knee: Secondary | ICD-10-CM | POA: Insufficient documentation

## 2011-04-15 DIAGNOSIS — I10 Essential (primary) hypertension: Secondary | ICD-10-CM | POA: Insufficient documentation

## 2011-04-15 LAB — COMPREHENSIVE METABOLIC PANEL
ALT: 16 U/L (ref 0–35)
AST: 13 U/L (ref 0–37)
Alkaline Phosphatase: 67 U/L (ref 39–117)
BUN: 13 mg/dL (ref 6–23)
CO2: 27 mEq/L (ref 19–32)
GFR calc Af Amer: >90 mL/min (ref >90)
Total Bilirubin: 0.5 mg/dL (ref 0.3–1.2)
Total Protein: 7.3 g/dL (ref 6.0–8.3)

## 2011-04-15 LAB — URINE CULTURE

## 2011-04-15 LAB — DIFFERENTIAL
Eosinophils Absolute: 0.2 10*3/uL (ref 0.0–0.7)
Lymphocytes Relative: 33 % (ref 12–46)
Monocytes Absolute: 0.3 10*3/uL (ref 0.1–1.0)
Neutro Abs: 3.2 10*3/uL (ref 1.7–7.7)

## 2011-04-15 LAB — CBC
MCH: 27.5 pg (ref 26.0–34.0)
RBC: 4.44 MIL/uL (ref 3.87–5.11)
RDW: 14 % (ref 11.5–15.5)
WBC: 5.6 10*3/uL (ref 4.0–10.5)

## 2011-04-15 LAB — URINALYSIS, ROUTINE W REFLEX MICROSCOPIC
Protein, ur: NEGATIVE mg/dL
Urobilinogen, UA: 0.2 mg/dL (ref 0.0–1.0)

## 2011-04-15 LAB — LIPASE, BLOOD: Lipase: 13 U/L (ref 11–59)

## 2011-04-15 MED ORDER — SODIUM CHLORIDE 0.9 % IV SOLN
INTRAVENOUS | Status: DC
  Administered 2011-04-15: 18:00:00 via INTRAVENOUS

## 2011-04-15 MED ORDER — IOHEXOL 300 MG/ML  SOLN
100.0000 mL | Freq: Once | INTRAMUSCULAR | Status: AC | PRN
  Administered 2011-04-15: 100 mL via INTRAVENOUS

## 2011-04-15 NOTE — ED Notes (Signed)
Pt returned from CT °

## 2011-04-15 NOTE — ED Provider Notes (Signed)
History     CSN: [id number]  Arrival date & time 04/15/11  1340   Chief Complaint  Patient presents with  . Abdominal Pain    HPI Pt was seen at 1655.  Per pt, c/o gradual onset and persistence of constant right sided abd pain x1 week.  Pain increases when she "moves certain ways" and palpation of the area.  Denies N/V/D, no fevers, no rash, no dysuria/hematuria, no vaginal bleeding/discharge, no CP/SOB, no cough, no injury.     Past Medical History  Diagnosis Date  . Hypertension   . Arthritis     bilat. knees    Past Surgical History  Procedure Date  . Partial hysterectomy jan 1999  . Foot surgery     Family History  Problem Relation Age of Onset  . Asthma Mother   . Asthma Father   . Asthma Sister   . Heart disease Mother   . Cancer Paternal Grandmother     mouth  . Pancreatic cancer Mother   . Colon cancer Father   . Prostate cancer Father   . Ovarian cancer Mother     History  Substance Use Topics  . Smoking status: Never Smoker   . Smokeless tobacco: Not on file  . Alcohol Use: Not on file   Review of Systems ROS: Statement: All systems negative except as marked or noted in the HPI; Constitutional: Negative for fever and chills. ; ; Eyes: Negative for eye pain, redness and discharge. ; ; ENMT: Negative for ear pain, hoarseness, nasal congestion, sinus pressure and sore throat. ; ; Cardiovascular: Negative for chest pain, palpitations, diaphoresis, dyspnea and peripheral edema. ; ; Respiratory: Negative for cough, wheezing and stridor. ; ; Gastrointestinal: +abd pain. Negative for nausea, vomiting, diarrhea, blood in stool, hematemesis, jaundice and rectal bleeding. . ; ; Genitourinary: Negative for dysuria, flank pain and hematuria. ; GYN:  No vaginal bleeding, no vaginal discharge, no vulvar pain.; ; Musculoskeletal: Negative for back pain and neck pain. Negative for swelling and trauma.; ; Skin: Negative for pruritus, rash, abrasions, blisters, bruising and  skin lesion.; ; Neuro: Negative for headache, lightheadedness and neck stiffness. Negative for weakness, altered level of consciousness , altered mental status, extremity weakness, paresthesias, involuntary movement, seizure and syncope.    Allergies  Review of patient's allergies indicates no known allergies.  Home Medications   Current Outpatient Rx  Name Route Sig Dispense Refill  . ATENOLOL 50 MG PO TABS Oral Take 50 mg by mouth daily.    Marland Kitchen DICLOFENAC SODIUM 75 MG PO TBEC Oral Take 1 tablet by mouth Twice daily.    Marland Kitchen LOPERAMIDE HCL 2 MG PO CAPS Oral Take 4 mg by mouth once as needed. For diarrhea      BP 153/70  Pulse 63  Temp(Src) 97.9 F (36.6 C) (Oral)  Resp 24  Ht 5\' 6"  (1.676 m)  Wt 377 lb (171.006 kg)  BMI 60.85 kg/m2  SpO2 100%  Physical Exam 1700: Physical examination:  Nursing notes reviewed; Vital signs and O2 SAT reviewed;  Constitutional: Well developed, Well nourished, Well hydrated, In no acute distress; Head:  Normocephalic, atraumatic; Eyes: EOMI, PERRL, No scleral icterus; ENMT: Mouth and pharynx normal, Mucous membranes moist; Neck: Supple, Full range of motion, No lymphadenopathy; Cardiovascular: Regular rate and rhythm, No murmur, rub, or gallop; Respiratory: Breath sounds clear & equal bilaterally, No rales, rhonchi, wheezes, or rub, Normal respiratory effort/excursion; Chest: Nontender, Movement normal; Abdomen: Soft, +mild generalized right sided abd tenderness to  palp, no rebound or guarding, Nondistended, Normal bowel sounds, no rash; Genitourinary: No CVA tenderness; Extremities: Pulses normal, No tenderness, No edema, No calf edema or asymmetry.; Neuro: AA&Ox3, Major CN grossly intact. Speech clear, no facial droop. No gross focal motor or sensory deficits in extremities.; Skin: Color normal, Warm, Dry, no rash.    ED Course  Procedures   MDM  MDM Reviewed: nursing note and vitals Interpretation: labs and CT scan   Results for orders placed during  the hospital encounter of 04/15/11  URINALYSIS, ROUTINE W REFLEX MICROSCOPIC      Component Value Range   Color, Urine YELLOW  YELLOW    APPearance CLEAR  CLEAR    Specific Gravity, Urine >1.030 (*) 1.005 - 1.030    pH 5.5  5.0 - 8.0    Glucose, UA NEGATIVE  NEGATIVE (mg/dL)   Hgb urine dipstick NEGATIVE  NEGATIVE    Bilirubin Urine SMALL (*) NEGATIVE    Ketones, ur TRACE (*) NEGATIVE (mg/dL)   Protein, ur NEGATIVE  NEGATIVE (mg/dL)   Urobilinogen, UA 0.2  0.0 - 1.0 (mg/dL)   Nitrite NEGATIVE  NEGATIVE    Leukocytes, UA NEGATIVE  NEGATIVE   CBC      Component Value Range   WBC 5.6  4.0 - 10.5 (K/uL)   RBC 4.44  3.87 - 5.11 (MIL/uL)   Hemoglobin 12.2  12.0 - 15.0 (g/dL)   HCT 16.1  09.6 - 04.5 (%)   MCV 84.0  78.0 - 100.0 (fL)   MCH 27.5  26.0 - 34.0 (pg)   MCHC 32.7  30.0 - 36.0 (g/dL)   RDW 40.9  81.1 - 91.4 (%)   Platelets 202  150 - 400 (K/uL)  DIFFERENTIAL      Component Value Range   Neutrophils Relative 57  43 - 77 (%)   Neutro Abs 3.2  1.7 - 7.7 (K/uL)   Lymphocytes Relative 33  12 - 46 (%)   Lymphs Abs 1.9  0.7 - 4.0 (K/uL)   Monocytes Relative 6  3 - 12 (%)   Monocytes Absolute 0.3  0.1 - 1.0 (K/uL)   Eosinophils Relative 3  0 - 5 (%)   Eosinophils Absolute 0.2  0.0 - 0.7 (K/uL)   Basophils Relative 0  0 - 1 (%)   Basophils Absolute 0.0  0.0 - 0.1 (K/uL)  COMPREHENSIVE METABOLIC PANEL      Component Value Range   Sodium 140  135 - 145 (mEq/L)   Potassium 3.4 (*) 3.5 - 5.1 (mEq/L)   Chloride 105  96 - 112 (mEq/L)   CO2 27  19 - 32 (mEq/L)   Glucose, Bld 90  70 - 99 (mg/dL)   BUN 13  6 - 23 (mg/dL)   Creatinine, Ser 7.82  0.50 - 1.10 (mg/dL)   Calcium 9.8  8.4 - 95.6 (mg/dL)   Total Protein 7.3  6.0 - 8.3 (g/dL)   Albumin 4.1  3.5 - 5.2 (g/dL)   AST 13  0 - 37 (U/L)   ALT 16  0 - 35 (U/L)   Alkaline Phosphatase 67  39 - 117 (U/L)   Total Bilirubin 0.5  0.3 - 1.2 (mg/dL)   GFR calc non Af Amer >90  >90 (mL/min)   GFR calc Af Amer >90  >90 (mL/min)    LIPASE, BLOOD      Component Value Range   Lipase 13  11 - 59 (U/L)    Ct Abdomen Pelvis W Contrast 04/15/2011  *  RADIOLOGY REPORT*  Clinical Data: Quadrant pain radiating to right growing, history hypertension  CT ABDOMEN AND PELVIS WITH CONTRAST  Technique:  Multidetector CT imaging of the abdomen and pelvis was performed following the standard protocol during bolus administration of intravenous contrast. Sagittal and coronal MPR images reconstructed from axial data set.  Contrast: OMNIPAQUE IOHEXOL 300 MG/ML IV SOLN Dilute oral contrast.  Comparison: 08/11/2009  Findings: Lung bases clear. Liver, spleen, pancreas, kidneys, and adrenal glands normal. Unremarkable bladder and ureters. Tiny umbilical hernia containing fat. Normal appendix. Uterus surgically absent with normal sized ovaries bilaterally. Stomach and bowel loops normal appearance. No mass, adenopathy, free fluid, or inflammatory process. Bilateral sacroiliitis. Dependent subcutaneous edema in the dorsal soft tissues of the lumbar region. Multilevel degenerative disc disease changes thoracolumbar spine.  IMPRESSION: No acute intra abdominal or intrapelvic abnormalities.  Original Report Authenticated By: Lollie Marrow, M.D.     8:10 PM:  Pt sitting in chair watching TV.  Now recalls that she bought a new mattress before the pain began.  Wants to go home now.  Does not want any pain meds.  Dx testing d/w pt.  Questions answered.  Verb understanding, agreeable to d/c home with outpt f/u.         Kyaire Gruenewald Allison Quarry, DO 04/17/11 1549

## 2011-04-15 NOTE — ED Notes (Signed)
Right lower quadrant pain 

## 2011-04-15 NOTE — ED Notes (Signed)
MD at bedside. 

## 2011-04-15 NOTE — ED Notes (Signed)
New IV placed in left hand for CT testing due to CT having difficulty inserting dye through previous IV. IV placed 22 G in Left hand.

## 2011-04-15 NOTE — ED Notes (Signed)
Patient transported to CT 

## 2011-04-15 NOTE — ED Notes (Signed)
Pt up to the bathroom at this time.

## 2012-01-21 ENCOUNTER — Encounter (INDEPENDENT_AMBULATORY_CARE_PROVIDER_SITE_OTHER): Payer: Self-pay | Admitting: *Deleted

## 2012-02-07 ENCOUNTER — Encounter (INDEPENDENT_AMBULATORY_CARE_PROVIDER_SITE_OTHER): Payer: Self-pay

## 2012-05-04 ENCOUNTER — Other Ambulatory Visit (HOSPITAL_COMMUNITY): Payer: Self-pay | Admitting: Internal Medicine

## 2012-05-04 DIAGNOSIS — IMO0001 Reserved for inherently not codable concepts without codable children: Secondary | ICD-10-CM

## 2012-05-12 ENCOUNTER — Ambulatory Visit (HOSPITAL_COMMUNITY)
Admission: RE | Admit: 2012-05-12 | Discharge: 2012-05-12 | Disposition: A | Discharge status: Home / Self Care | Payer: PRIVATE HEALTH INSURANCE | Source: Ambulatory Visit | Attending: Internal Medicine | Admitting: Internal Medicine

## 2012-05-12 DIAGNOSIS — IMO0001 Reserved for inherently not codable concepts without codable children: Secondary | ICD-10-CM

## 2012-05-12 DIAGNOSIS — Z1231 Encounter for screening mammogram for malignant neoplasm of breast: Secondary | ICD-10-CM | POA: Insufficient documentation

## 2013-10-01 ENCOUNTER — Other Ambulatory Visit (HOSPITAL_COMMUNITY): Payer: Self-pay | Admitting: Internal Medicine

## 2013-10-01 DIAGNOSIS — Z1231 Encounter for screening mammogram for malignant neoplasm of breast: Secondary | ICD-10-CM

## 2013-10-05 ENCOUNTER — Encounter (INDEPENDENT_AMBULATORY_CARE_PROVIDER_SITE_OTHER): Payer: Self-pay | Admitting: *Deleted

## 2013-10-06 ENCOUNTER — Ambulatory Visit (HOSPITAL_COMMUNITY)
Admission: RE | Admit: 2013-10-06 | Discharge: 2013-10-06 | Disposition: A | Discharge status: Home / Self Care | Payer: PRIVATE HEALTH INSURANCE | Source: Ambulatory Visit | Attending: Internal Medicine | Admitting: Internal Medicine

## 2013-10-06 DIAGNOSIS — Z1231 Encounter for screening mammogram for malignant neoplasm of breast: Secondary | ICD-10-CM | POA: Insufficient documentation

## 2013-10-19 ENCOUNTER — Other Ambulatory Visit (INDEPENDENT_AMBULATORY_CARE_PROVIDER_SITE_OTHER): Payer: Self-pay | Admitting: *Deleted

## 2013-10-19 ENCOUNTER — Encounter (INDEPENDENT_AMBULATORY_CARE_PROVIDER_SITE_OTHER): Payer: Self-pay | Admitting: *Deleted

## 2013-10-19 DIAGNOSIS — Z800 Family history of malignant neoplasm of digestive organs: Secondary | ICD-10-CM

## 2013-11-24 ENCOUNTER — Telehealth (INDEPENDENT_AMBULATORY_CARE_PROVIDER_SITE_OTHER): Payer: Self-pay | Admitting: *Deleted

## 2013-11-24 DIAGNOSIS — Z1211 Encounter for screening for malignant neoplasm of colon: Secondary | ICD-10-CM

## 2013-11-24 NOTE — Telephone Encounter (Signed)
Patient needs movi prep 

## 2013-11-26 MED ORDER — PEG-KCL-NACL-NASULF-NA ASC-C 100 G PO SOLR: 1.0000 | Freq: Once | ORAL | Status: DC

## 2013-12-08 ENCOUNTER — Telehealth (INDEPENDENT_AMBULATORY_CARE_PROVIDER_SITE_OTHER): Payer: Self-pay | Admitting: *Deleted

## 2013-12-08 NOTE — Telephone Encounter (Signed)
  Procedure: tcs  Reason/Indication:  fam hx colon ca  Has patient had this procedure before?  Yes, 2008 -- scanned  If so, when, by whom and where?    Is there a family history of colon cancer?  Yes, father  Who?  What age when diagnosed?    Is patient diabetic?   no      Does patient have prosthetic heart valve?  no  Do you have a pacemaker?  no  Has patient ever had endocarditis? no  Has patient had joint replacement within last 12 months?  no  Does patient tend to be constipated or take laxatives? occassionally  Is patient on Coumadin, Plavix and/or Aspirin? no  Medications: diclofenac 75 mg bid, atenolol 50 mg daily, cyclobenzaprine 10 mg prn, iron  Allergies: nkda  Medication Adjustment: iron 10 days  Procedure date & time: 01/06/14 at 830

## 2013-12-09 NOTE — Telephone Encounter (Signed)
agree

## 2013-12-22 ENCOUNTER — Encounter (HOSPITAL_COMMUNITY): Payer: Self-pay | Admitting: Pharmacy Technician

## 2014-01-06 ENCOUNTER — Ambulatory Visit (HOSPITAL_COMMUNITY)
Admission: RE | Admit: 2014-01-06 | Discharge: 2014-01-06 | Disposition: A | Discharge status: Home / Self Care | Payer: PRIVATE HEALTH INSURANCE | Source: Ambulatory Visit | Attending: Internal Medicine | Admitting: Internal Medicine

## 2014-01-06 ENCOUNTER — Encounter (HOSPITAL_COMMUNITY): Payer: Self-pay

## 2014-01-06 ENCOUNTER — Encounter (HOSPITAL_COMMUNITY)
Admission: RE | Disposition: A | Discharge status: Home / Self Care | Payer: Self-pay | Source: Ambulatory Visit | Attending: Internal Medicine

## 2014-01-06 DIAGNOSIS — M1990 Unspecified osteoarthritis, unspecified site: Secondary | ICD-10-CM | POA: Insufficient documentation

## 2014-01-06 DIAGNOSIS — I10 Essential (primary) hypertension: Secondary | ICD-10-CM | POA: Diagnosis not present

## 2014-01-06 DIAGNOSIS — K5700 Diverticulitis of small intestine with perforation and abscess without bleeding: Secondary | ICD-10-CM

## 2014-01-06 DIAGNOSIS — Z1211 Encounter for screening for malignant neoplasm of colon: Secondary | ICD-10-CM | POA: Diagnosis not present

## 2014-01-06 DIAGNOSIS — Z800 Family history of malignant neoplasm of digestive organs: Secondary | ICD-10-CM | POA: Insufficient documentation

## 2014-01-06 DIAGNOSIS — Q2733 Arteriovenous malformation of digestive system vessel: Secondary | ICD-10-CM | POA: Insufficient documentation

## 2014-01-06 DIAGNOSIS — Z79899 Other long term (current) drug therapy: Secondary | ICD-10-CM | POA: Diagnosis not present

## 2014-01-06 DIAGNOSIS — K5730 Diverticulosis of large intestine without perforation or abscess without bleeding: Secondary | ICD-10-CM | POA: Insufficient documentation

## 2014-01-06 HISTORY — PX: COLONOSCOPY: SHX5424

## 2014-01-06 SURGERY — COLONOSCOPY
Anesthesia: Moderate Sedation

## 2014-01-06 MED ORDER — MEPERIDINE HCL 50 MG/ML IJ SOLN
INTRAMUSCULAR | Status: AC
  Filled 2014-01-06: qty 1

## 2014-01-06 MED ORDER — SODIUM CHLORIDE 0.9 % IV SOLN
INTRAVENOUS | Status: DC
  Administered 2014-01-06: 08:00:00 via INTRAVENOUS

## 2014-01-06 MED ORDER — MIDAZOLAM HCL 5 MG/5ML IJ SOLN
INTRAMUSCULAR | Status: AC
  Filled 2014-01-06: qty 10

## 2014-01-06 MED ORDER — MIDAZOLAM HCL 5 MG/5ML IJ SOLN
INTRAMUSCULAR | Status: DC | PRN
  Administered 2014-01-06 (×5): 2 mg via INTRAVENOUS

## 2014-01-06 MED ORDER — MEPERIDINE HCL 50 MG/ML IJ SOLN
INTRAMUSCULAR | Status: DC | PRN
  Administered 2014-01-06 (×2): 25 mg via INTRAVENOUS

## 2014-01-06 MED ORDER — STERILE WATER FOR IRRIGATION IR SOLN
Status: DC | PRN
  Administered 2014-01-06: 08:00:00

## 2014-01-06 NOTE — Discharge Instructions (Signed)
Resume usual medications and high fiber diet. No driving for 24 hours. Next screening exam in 5 years.  High-Fiber Diet Fiber is found in fruits, vegetables, and grains. A high-fiber diet encourages the addition of more whole grains, legumes, fruits, and vegetables in your diet. The recommended amount of fiber for adult males is 38 g per day. For adult females, it is 25 g per day. Pregnant and lactating women should get 28 g of fiber per day. If you have a digestive or bowel problem, ask your caregiver for advice before adding high-fiber foods to your diet. Eat a variety of high-fiber foods instead of only a select few type of foods.  PURPOSE  To increase stool bulk.  To make bowel movements more regular to prevent constipation.  To lower cholesterol.  To prevent overeating. WHEN IS THIS DIET USED?  It may be used if you have constipation and hemorrhoids.  It may be used if you have uncomplicated diverticulosis (intestine condition) and irritable bowel syndrome.  It may be used if you need help with weight management.  It may be used if you want to add it to your diet as a protective measure against atherosclerosis, diabetes, and cancer. SOURCES OF FIBER  Whole-grain breads and cereals.  Fruits, such as apples, oranges, bananas, berries, prunes, and pears.  Vegetables, such as green peas, carrots, sweet potatoes, beets, broccoli, cabbage, spinach, and artichokes.  Legumes, such split peas, soy, lentils.  Almonds. FIBER CONTENT IN FOODS Starches and Grains / Dietary Fiber (g)  Cheerios, 1 cup / 3 g  Corn Flakes cereal, 1 cup / 0.7 g  Rice crispy treat cereal, 1 cup / 0.3 g  Instant oatmeal (cooked),  cup / 2 g  Frosted wheat cereal, 1 cup / 5.1 g  Brown, long-grain rice (cooked), 1 cup / 3.5 g  White, long-grain rice (cooked), 1 cup / 0.6 g  Enriched macaroni (cooked), 1 cup / 2.5 g Legumes / Dietary Fiber (g)  Baked beans (canned, plain, or vegetarian),  cup  / 5.2 g  Kidney beans (canned),  cup / 6.8 g  Pinto beans (cooked),  cup / 5.5 g Breads and Crackers / Dietary Fiber (g)  Plain or honey graham crackers, 2 squares / 0.7 g  Saltine crackers, 3 squares / 0.3 g  Plain, salted pretzels, 10 pieces / 1.8 g  Whole-wheat bread, 1 slice / 1.9 g  White bread, 1 slice / 0.7 g  Raisin bread, 1 slice / 1.2 g  Plain bagel, 3 oz / 2 g  Flour tortilla, 1 oz / 0.9 g  Corn tortilla, 1 small / 1.5 g  Hamburger or hotdog bun, 1 small / 0.9 g Fruits / Dietary Fiber (g)  Apple with skin, 1 medium / 4.4 g  Sweetened applesauce,  cup / 1.5 g  Banana,  medium / 1.5 g  Grapes, 10 grapes / 0.4 g  Orange, 1 small / 2.3 g  Raisin, 1.5 oz / 1.6 g  Melon, 1 cup / 1.4 g Vegetables / Dietary Fiber (g)  Green beans (canned),  cup / 1.3 g  Carrots (cooked),  cup / 2.3 g  Broccoli (cooked),  cup / 2.8 g  Peas (cooked),  cup / 4.4 g  Mashed potatoes,  cup / 1.6 g  Lettuce, 1 cup / 0.5 g  Corn (canned),  cup / 1.6 g  Tomato,  cup / 1.1 g Document Released: 03/11/2005 Document Revised: 09/10/2011 Document Reviewed: 06/13/2011 [employer] Patient Information  2015 [employer]. This information is not intended to replace advice given to you by your health care provider. Make sure you discuss any questions you have with your health care provider. Colonoscopy, Care After Refer to this sheet in the next few weeks. These instructions provide you with information on caring for yourself after your procedure. Your health care provider may also give you more specific instructions. Your treatment has been planned according to current medical practices, but problems sometimes occur. Call your health care provider if you have any problems or questions after your procedure. WHAT TO EXPECT AFTER THE PROCEDURE  After your procedure, it is typical to have the following:  A small amount of blood in your stool.  Moderate amounts of gas and mild  abdominal cramping or bloating. HOME CARE INSTRUCTIONS  Do not drive, operate machinery, or sign important documents for 24 hours.  You may shower and resume your regular physical activities, but move at a slower pace for the first 24 hours.  Take frequent rest periods for the first 24 hours.  Walk around or put a warm pack on your abdomen to help reduce abdominal cramping and bloating.  Drink enough fluids to keep your urine clear or pale yellow.  You may resume your normal diet as instructed by your health care provider. Avoid heavy or fried foods that are hard to digest.  Avoid drinking alcohol for 24 hours or as instructed by your health care provider.  Only take over-the-counter or prescription medicines as directed by your health care provider.  If a tissue sample (biopsy) was taken during your procedure:  Do not take aspirin or blood thinners for 7 days, or as instructed by your health care provider.  Do not drink alcohol for 7 days, or as instructed by your health care provider.  Eat soft foods for the first 24 hours. SEEK MEDICAL CARE IF: You have persistent spotting of blood in your stool 2-3 days after the procedure. SEEK IMMEDIATE MEDICAL CARE IF:  You have more than a small spotting of blood in your stool.  You pass large blood clots in your stool.  Your abdomen is swollen (distended).  You have nausea or vomiting.  You have a fever.  You have increasing abdominal pain that is not relieved with medicine. Document Released: 10/24/2003 Document Revised: 12/30/2012 Document Reviewed: 11/16/2012 [facility] Patient Information 2015 [place], [place]. This information is not intended to replace advice given to you by your health care provider. Make sure you discuss any questions you have with your health care provider.

## 2014-01-06 NOTE — H&P (Addendum)
Whitney Bauer is an 58 y.o. female.   Chief Complaint: Patient is here for colonoscopy. HPI: Patient is 58 year old African female who is here for screening colonoscopy. She denies abdominal pain change in bowel habits or rectal bleeding. This is patient's third colonoscopy. Last colonoscopy was in November 2008 at [facility] in [place], [place]. Family history is positive for colon carcinoma in her father who was 21 or 19 at the time of diagnosis. He presented with perforation.  Past Medical History  Diagnosis Date  . Hypertension   . Arthritis     bilat. knees    Past Surgical History  Procedure Laterality Date  . Partial hysterectomy  jan 1999  . Foot surgery      Family History  Problem Relation Age of Onset  . Asthma Mother   . Asthma Father   . Asthma Sister   . Heart disease Mother   . Cancer Paternal Grandmother     mouth  . Pancreatic cancer Mother   . Colon cancer Father   . Prostate cancer Father   . Ovarian cancer Mother    Social History:  reports that she has never smoked. She does not have any smokeless tobacco history on file. Her alcohol and drug histories are not on file.  Allergies: No Known Allergies  Medications Prior to Admission  Medication Sig Dispense Refill  . atenolol (TENORMIN) 50 MG tablet Take 50 mg by mouth daily.      . cyclobenzaprine (FLEXERIL) 10 MG tablet Take 10 mg by mouth 3 (three) times daily as needed for muscle spasms.      . diclofenac (VOLTAREN) 75 MG EC tablet Take 75 mg by mouth Twice daily.       Marland Kitchen loperamide (IMODIUM) 1 MG/5ML solution Take 6 mg by mouth as needed for diarrhea or loose stools (patient uses at least twice weekly).      . peg 3350 powder (MOVIPREP) 100 G SOLR Take 1 kit (200 g total) by mouth once.  1 kit  0    No results found for this or any previous visit (from the past 48 hour(s)). No results found.  ROS  Blood pressure 169/108, pulse 98, temperature 98.1 F (36.7 C), temperature source Oral, resp. rate 20, height  5' 5.5" (1.664 m), weight 357 lb (161.934 kg), SpO2 100.00%. Physical Exam  Constitutional:  Well-developed obese African American female in NAD.  HENT:  Mouth/Throat: Oropharynx is clear and moist.  Eyes: Conjunctivae are normal. No scleral icterus.  Neck: No thyromegaly present.  Cardiovascular: Normal rate, regular rhythm and normal heart sounds.   No murmur heard. Respiratory: Effort normal and breath sounds normal.  GI: Soft. She exhibits no distension and no mass. There is no tenderness.  Musculoskeletal: She exhibits no edema.  Lymphadenopathy:    She has no cervical adenopathy.  Neurological: She is alert.  Skin: Skin is warm and dry.     Assessment/Plan High-risk screening colonoscopy. Family history of CRC in father.  REHMAN,NAJEEB U 01/06/2014, 8:22 AM

## 2014-01-06 NOTE — Op Note (Signed)
COLONOSCOPY PROCEDURE REPORT  PATIENT:  Whitney Bauer  MR#:  [id number] Birthdate:  08-26-1955, 59 y.o., female Endoscopist:  Dr. Malissa Hippo, MD Referred By:  Dr. Carylon Perches, MD Procedure Date: 01/06/2014  Procedure:   Colonoscopy  Indications:  Patient is 58 year old female was undergoing high-risk screening colonoscopy. Last colonoscopy was in November 2008. Family history significant for colon carcinoma in her father who was 35 or 37 at the time of diagnosis.  Informed Consent:  The procedure and risks were reviewed with the patient and informed consent was obtained.  Medications:  Demerol 50 mg IV Versed 10 mg IV  Description of procedure:  After a digital rectal exam was performed, that colonoscope was advanced from the anus through the rectum and colon to the area of the cecum, ileocecal valve and appendiceal orifice. The cecum was deeply intubated. These structures were well-seen and photographed for the record. From the level of the cecum and ileocecal valve, the scope was slowly and cautiously withdrawn. The mucosal surfaces were carefully surveyed utilizing scope tip to flexion to facilitate fold flattening as needed. The scope was pulled down into the rectum where a thorough exam including retroflexion was performed.  Findings:   Prep excellent. Single small AV malformation noted at cecum. Scattered diverticula throughout the colon but mainly at ascending and sigmoid colon. Normal rectal mucosa and anorectal junction.   Therapeutic/Diagnostic Maneuvers Performed:   None  Complications:  None  Cecal Withdrawal Time:  9 minutes  Impression:  Examination performed to cecum. Small cecal AV malformation. Pan colonic diverticulosis. No evidence of colonic polyps.  Recommendations:  Standard instructions given. Next screening exam in 5 years.  Uriyah Massimo U  01/06/2014 8:55 AM  CC: Dr. Carylon Perches, MD & Dr. Bonnetta Barry ref. provider found

## 2014-01-10 ENCOUNTER — Encounter (HOSPITAL_COMMUNITY): Payer: Self-pay | Admitting: Internal Medicine

## 2014-08-24 ENCOUNTER — Emergency Department (HOSPITAL_COMMUNITY)
Admission: EM | Admit: 2014-08-24 | Discharge: 2014-08-24 | Disposition: A | Discharge status: Home / Self Care | Payer: PRIVATE HEALTH INSURANCE | Attending: Emergency Medicine | Admitting: Emergency Medicine

## 2014-08-24 ENCOUNTER — Encounter (HOSPITAL_COMMUNITY): Payer: Self-pay | Admitting: *Deleted

## 2014-08-24 DIAGNOSIS — Z791 Long term (current) use of non-steroidal anti-inflammatories (NSAID): Secondary | ICD-10-CM | POA: Diagnosis not present

## 2014-08-24 DIAGNOSIS — M7989 Other specified soft tissue disorders: Secondary | ICD-10-CM | POA: Insufficient documentation

## 2014-08-24 DIAGNOSIS — M1990 Unspecified osteoarthritis, unspecified site: Secondary | ICD-10-CM | POA: Insufficient documentation

## 2014-08-24 DIAGNOSIS — Z79899 Other long term (current) drug therapy: Secondary | ICD-10-CM | POA: Insufficient documentation

## 2014-08-24 DIAGNOSIS — T162XXA Foreign body in left ear, initial encounter: Secondary | ICD-10-CM | POA: Diagnosis not present

## 2014-08-24 DIAGNOSIS — I10 Essential (primary) hypertension: Secondary | ICD-10-CM | POA: Insufficient documentation

## 2014-08-24 DIAGNOSIS — Y9289 Other specified places as the place of occurrence of the external cause: Secondary | ICD-10-CM | POA: Insufficient documentation

## 2014-08-24 DIAGNOSIS — Y9389 Activity, other specified: Secondary | ICD-10-CM | POA: Diagnosis not present

## 2014-08-24 DIAGNOSIS — Y998 Other external cause status: Secondary | ICD-10-CM | POA: Insufficient documentation

## 2014-08-24 DIAGNOSIS — X58XXXA Exposure to other specified factors, initial encounter: Secondary | ICD-10-CM | POA: Diagnosis not present

## 2014-08-24 MED ORDER — OFLOXACIN 0.3 % OP SOLN
5.0000 [drp] | Freq: Every day | OPHTHALMIC | Status: DC
  Administered 2014-08-24: 5 [drp] via OTIC
  Filled 2014-08-24: qty 5

## 2014-08-24 NOTE — ED Notes (Signed)
Pt updated on delay,  

## 2014-08-24 NOTE — ED Notes (Signed)
Pt states that prior to arrival in er she felt a "bug" fly into her left ear, felt the fluttering but after she got to er she no longer feels the fluttering but wants to make sure that the "bug" is gone, pt lying on stretcher, no distress noted, update given on plan of care

## 2014-08-24 NOTE — ED Notes (Signed)
AC called for ear drops.

## 2014-08-24 NOTE — ED Notes (Signed)
Pt states a bug flew in her left ear about 45 minutes ago, has been buzzing in her ear, buzzing has stopped but pt is afraid the insect is still in her ear.

## 2014-08-24 NOTE — ED Notes (Signed)
Hobson PA at bedside,  

## 2014-08-24 NOTE — Discharge Instructions (Signed)
Please use 5 drops of ofloxacin to your left ear daily for the next 5-7 days. Please see Dr. Ouida Sills for additional evaluation and management if not improving. Ear Foreign Body An ear foreign body is an object that is stuck in the ear. It is common for young children to put objects into the ear canal. These may include pebbles, beads, beans, and any other small objects which will fit. In adults, objects such as cotton swabs may become lodged in the ear canal. In all ages, the most common foreign bodies are insects that enter the ear canal.  SYMPTOMS  Foreign bodies may cause pain, buzzing or roaring sounds, hearing loss, and ear drainage.  HOME CARE INSTRUCTIONS   Keep all follow-up appointments with your caregiver as told.  Keep small objects out of reach of young children. Tell them not to put anything in their ears. SEEK IMMEDIATE MEDICAL CARE IF:   You have bleeding from the ear.  You have increased pain or swelling of the ear.  You have reduced hearing.  You have discharge coming from the ear.  You have a fever.  You have a headache. MAKE SURE YOU:   Understand these instructions.  Will watch your condition.  Will get help right away if you are not doing well or get worse. Document Released: 03/08/2000 Document Revised: 06/03/2011 Document Reviewed: 10/28/2007 [facility] Patient Information 2015 [place], [place]. This information is not intended to replace advice given to you by your health care provider. Make sure you discuss any questions you have with your health care provider.

## 2014-08-24 NOTE — ED Provider Notes (Signed)
CSN: [id number]     Arrival date & time 08/24/14  2013 History   First MD Initiated Contact with Patient 08/24/14 2149     Chief Complaint  Patient presents with  . Foreign Body in Ear     (Consider location/radiation/quality/duration/timing/severity/associated sxs/prior Treatment) HPI Comments: Patient is a 58 year old female who presents to the emergency department with "something in my left ear".  The patient states that she feels like a bug may have flown in her left ear. She initially felt it moving or fluttering, but upon her arrival to the emergency department she no longer feels the sensation. The patient denies any problem with the right ear. His been no bleeding or drainage from the left ear since this episode occurred.  Patient is a 59 y.o. female presenting with foreign body in ear. The history is provided by the patient.  Foreign Body in Ear This is a new problem. The current episode started today. Associated symptoms include arthralgias.    Past Medical History  Diagnosis Date  . Hypertension   . Arthritis     bilat. knees   Past Surgical History  Procedure Laterality Date  . Partial hysterectomy  jan 1999  . Foot surgery    . Colonoscopy N/A 01/06/2014    Procedure: COLONOSCOPY;  Surgeon: Malissa Hippo, MD;  Location: [facility];  Service: Endoscopy;  Laterality: N/A;  830   Family History  Problem Relation Age of Onset  . Asthma Mother   . Asthma Father   . Asthma Sister   . Heart disease Mother   . Cancer Paternal Grandmother     mouth  . Pancreatic cancer Mother   . Colon cancer Father   . Prostate cancer Father   . Ovarian cancer Mother    History  Substance Use Topics  . Smoking status: Never Smoker   . Smokeless tobacco: Not on file  . Alcohol Use: Not on file   OB History    No data available     Review of Systems  HENT: Positive for ear pain.   Cardiovascular: Positive for leg swelling.  Musculoskeletal: Positive for arthralgias.   All other systems reviewed and are negative.     Allergies  Review of patient's allergies indicates no known allergies.  Home Medications   Prior to Admission medications   Medication Sig Start Date End Date Taking? Authorizing Provider  atenolol (TENORMIN) 50 MG tablet Take 50 mg by mouth daily.   Yes Historical Provider, MD  diclofenac (VOLTAREN) 75 MG EC tablet Take 75 mg by mouth Twice daily.  01/19/11  Yes Historical Provider, MD  cyclobenzaprine (FLEXERIL) 10 MG tablet Take 10 mg by mouth 3 (three) times daily as needed for muscle spasms.    Historical Provider, MD  loperamide (IMODIUM) 1 MG/5ML solution Take 6 mg by mouth as needed for diarrhea or loose stools (patient uses at least twice weekly).    Historical Provider, MD   BP 176/90 mmHg  Pulse 70  Temp(Src) 98.2 F (36.8 C) (Oral)  Resp 20  Ht 5\' 5"  (1.651 m)  Wt 350 lb (158.759 kg)  BMI 58.24 kg/m2  SpO2 100% Physical Exam  Constitutional: She is oriented to person, place, and time. She appears well-developed and well-nourished.  Non-toxic appearance.  HENT:  Head: Normocephalic.  Right Ear: Tympanic membrane and external ear normal.  Left Ear: Tympanic membrane and external ear normal.  Right ear is clear.  There is a foreign body in the left  year that resembles a bug. There is increased redness of the external auditory canal. The tympanic membrane is intact.  Eyes: EOM and lids are normal. Pupils are equal, round, and reactive to light.  Neck: Normal range of motion. Neck supple. Carotid bruit is not present.  Cardiovascular: Normal rate, regular rhythm, normal heart sounds, intact distal pulses and normal pulses.   Pulmonary/Chest: Breath sounds normal. No respiratory distress.  Abdominal: Soft. Bowel sounds are normal. There is no tenderness. There is no guarding.  Musculoskeletal: Normal range of motion.  There is 2+ pitting edema of right and left lower extremities.  Lymphadenopathy:       Head (right  side): No submandibular adenopathy present.       Head (left side): No submandibular adenopathy present.    She has no cervical adenopathy.  Neurological: She is alert and oriented to person, place, and time. She has normal strength. No cranial nerve deficit or sensory deficit.  Skin: Skin is warm and dry.  Psychiatric: She has a normal mood and affect. Her speech is normal.  Nursing note and vitals reviewed.   ED Course  FOREIGN BODY REMOVAL. Patient is a 59 year old female who presents to the emergency department with "something in my tear". Examination reveals foreign body in the left year that resembles a bug.  Permission for the procedure obtained from the patient. Patient identified by arm band. Procedural time out taken before initiating procedure. The procedure was explained to the patient in terms which he understood. Following this using the ear speculum the foreign body was identified. Using suction the foreign body was carefully removed from the ear. Following the removal of the foreign body, there are noted scratches and increased redness of the external auditory canal. The tympanic membrane is intact.  The patient will be treated with ofloxacin, and Tylenol or ibuprofen for soreness. Patient tolerated the procedure without problem.   Procedures (including critical care time) Labs Review Labs Reviewed - No data to display  Imaging Review No results found.   EKG Interpretation None      MDM  Patient had a foreign body in the left year, that was successfully removed without problem. There is noted increased redness and some scratches on the external auditory canal. The patient is treated with ofloxacin solution daily. The patient is to return to the emergency department or see the primary physician if not improving.    Final diagnoses:  None    **I have reviewed nursing notes, vital signs, and all appropriate lab and imaging results for this patient.*    Ivery Quale, PA-C 08/24/14 2207  Ivery Quale, PA-C 08/24/14 1194  Bethann Berkshire, MD 08/25/14 1131

## 2014-12-15 ENCOUNTER — Other Ambulatory Visit (HOSPITAL_COMMUNITY): Payer: Self-pay | Admitting: Internal Medicine

## 2014-12-15 DIAGNOSIS — R1011 Right upper quadrant pain: Secondary | ICD-10-CM

## 2014-12-15 DIAGNOSIS — Z139 Encounter for screening, unspecified: Secondary | ICD-10-CM

## 2014-12-19 ENCOUNTER — Other Ambulatory Visit (HOSPITAL_COMMUNITY): Payer: Self-pay | Admitting: Internal Medicine

## 2014-12-19 ENCOUNTER — Ambulatory Visit (HOSPITAL_COMMUNITY)
Admission: RE | Admit: 2014-12-19 | Discharge: 2014-12-19 | Disposition: A | Discharge status: Home / Self Care | Payer: PRIVATE HEALTH INSURANCE | Source: Ambulatory Visit | Attending: Internal Medicine | Admitting: Internal Medicine

## 2014-12-19 ENCOUNTER — Encounter (HOSPITAL_COMMUNITY): Payer: PRIVATE HEALTH INSURANCE

## 2014-12-19 ENCOUNTER — Ambulatory Visit (HOSPITAL_COMMUNITY): Payer: PRIVATE HEALTH INSURANCE

## 2014-12-19 DIAGNOSIS — K760 Fatty (change of) liver, not elsewhere classified: Secondary | ICD-10-CM | POA: Diagnosis not present

## 2014-12-19 DIAGNOSIS — R1011 Right upper quadrant pain: Secondary | ICD-10-CM | POA: Diagnosis present

## 2014-12-19 DIAGNOSIS — Z1231 Encounter for screening mammogram for malignant neoplasm of breast: Secondary | ICD-10-CM | POA: Diagnosis not present

## 2014-12-28 ENCOUNTER — Ambulatory Visit: Payer: PRIVATE HEALTH INSURANCE | Admitting: Dietician

## 2015-01-02 ENCOUNTER — Encounter: Payer: PRIVATE HEALTH INSURANCE | Attending: Internal Medicine | Admitting: Nutrition

## 2015-01-02 ENCOUNTER — Encounter: Payer: Self-pay | Admitting: Nutrition

## 2015-01-02 DIAGNOSIS — Z6841 Body Mass Index (BMI) 40.0 and over, adult: Secondary | ICD-10-CM | POA: Insufficient documentation

## 2015-01-02 DIAGNOSIS — Z713 Dietary counseling and surveillance: Secondary | ICD-10-CM | POA: Insufficient documentation

## 2015-01-02 DIAGNOSIS — I10 Essential (primary) hypertension: Secondary | ICD-10-CM | POA: Diagnosis not present

## 2015-01-02 NOTE — Patient Instructions (Addendum)
Goals 1. Follow Plate Method 2. Cut out bacon, fat back and processed meals. 3. Increase fresh fruits and vegetables. 4. Cut out sweets, junk food 5. Drink more water and cut down on gatorade and diet sodas. 6. Lose 1-2 lbs per week. 7. Cut out snacks between meals and supper. 8 Keep food journal daily.

## 2015-01-02 NOTE — Progress Notes (Signed)
  Medical Nutrition Therapy:  Appt start time: 1500 end time:  1600. Assessment:  Primary concerns today: Obesity. BMI 67. Lives by herself. Works 3 shift in [employer]. This is the most she is ever weighed. Eats 3 meals and 2-3 snacks. Reluctant to give up favorite foods for needed weight loss. Only wants to wean herself from them as opposed to cutting them out; sodas, processed foods, high fat foods, snacks and juices. Not able to exercise due to pain she reports. Willing to think about water aerobics. Physical activity: none.  Denies prediabetes.  Scale of 8 in ready to changes. Snacks at lot a work and uses dips or ranch dressing, popcorn and other salty processed foods for snacks. Has cut down on diet sodas to 1 per day. Has thought about gastric bypass but hasn't proceeded further. No PMH other than obesity and HTN. Diet is excessive in calories and high in fat, sodium and low in fiber. Did keep a food journal and brought it in. Has done weight watchers in the past but quit because she didn't like weighing in weekly.   Wt Readings from Last 3 Encounters:  01/02/15 352 lb (159.666 kg)  08/24/14 350 lb (158.759 kg)  01/06/14 357 lb (161.934 kg)   Ht Readings from Last 3 Encounters:  01/02/15 5\' 2"  (1.575 m)  08/24/14 5\' 5"  (1.651 m)  01/06/14 5' 5.5" (1.664 m)   Body mass index is 64.37 kg/(m^2). Preferred Learning Style:   No preference indicated    Learning Readiness:   Ready  Change in progress   MEDICATIONS: See list   DIETARY INTAKE: 24-hr recall:  B ( AM): Bacon, egg and fatback, on biscuts,water,   Snk ( AM): chips, popcorn L ( PM): 1/2 ribeye steak, water Snk ( PM):  D ( PM): broiled fish, rice, water Snk ( PM): popcorn Beverages: water, gatorade, lemonade, soda and diet sodas.  Usual physical activity: ADL  Estimated energy needs: 1500 calories 170 g carbohydrates 112 g protein 42 g fat  Progress Towards Goal(s):  In progress.   Nutritional  Diagnosis:  NB-1.1 Food and nutrition-related knowledge deficit As related to Morbid obesity.  As evidenced by BMI> 40.    Intervention:  Nutrition counseling and initiated. Discussed Carb Counting as method of portion control, reading food labels, and benefits of increased activity. Low fat, low sodium high fiber diet. Avoiding snacks and sticking to a meal schedule.  Goals 1. Follow Plate Method 2. Cut out bacon, fat back and processed meals. 3. Increase fresh fruits and vegetables. 4. Cut out sweets, junk food 5. Drink more water and cut down on gatorade and diet sodas. 6. Lose 1-2 lbs per week. 7. Cut out snacks between meals and supper. 8 Keep food journal daily.    Teaching Method Utilized:  Visual Auditory Hands on  Handouts given during visit include:  The Plate Method  Successful weight loss tips  Meal Plan Card  Barriers to learning/adherence to lifestyle change: None  Demonstrated degree of understanding via:  Teach Back   Monitoring/Evaluation:  Dietary intake, exercise, meal planning, and body weight in 1 month(s).

## 2015-02-08 ENCOUNTER — Encounter: Payer: PRIVATE HEALTH INSURANCE | Attending: Internal Medicine | Admitting: Nutrition

## 2015-02-08 DIAGNOSIS — I10 Essential (primary) hypertension: Secondary | ICD-10-CM

## 2015-02-08 NOTE — Progress Notes (Signed)
  Medical Nutrition Therapy:  Appt start time: 1500 end time:  1600. Assessment:  Primary concerns today: Obesity. BMI 63.38.  Lost 6 lbs.  Cut out popcorn and eating more fruit. Kept a food journal. It helped increase her awareness of what she has been eating.Has cut out bread. Cut down on gatorade, sodas and juices. Feels better. Sees Dr. Ouida Sills for her PCP. Lipid profile has been normal in the past. CMet; particularly GLU was normal. Unsure if her has had an A1C done to rule out prediabetes. PMH: Hypertension, Obesity  Diet is still higher in fat and sodium from processed foods of pot pies etc. Diet need more high fiber and lower carb vegetables, lean meats and less fat and sodium. Meals are inconsistent and not well balanced but overall improved. Making slow progress. Needs to increase physical activity for sustained weight loss.  Wt Readings from Last 3 Encounters:  02/08/15 346 lb 9.6 oz (157.217 kg)  01/02/15 352 lb (159.666 kg)  08/24/14 350 lb (158.759 kg)   Body mass index is 63.38 kg/(m^2).   Preferred Learning Style:   No preference indicated    Learning Readiness:   Ready  Change in progress   MEDICATIONS: See list   DIETARY INTAKE: 24-hr recall:  B ( AM): [place] stew 1 cup, 10 crackers,water Snk ( AM):  L ( PM): Chicken pot pie, water Snk ( PM):  D ( PM): broiled fish, rice, water Snk ( PM):   Beverages: water, .  Usual physical activity: ADL  Estimated energy needs: 1500 calories 170 g carbohydrates 112 g protein 42 g fat  Progress Towards Goal(s):  In progress.   Nutritional Diagnosis:  NB-1.1 Food and nutrition-related knowledge deficit As related to Morbid obesity.  As evidenced by BMI> 40.    Intervention:  Nutrition counseling and initiated. Discussed Carb Counting as method of portion control, reading food labels, and benefits of increased activity. Low fat, low sodium high fiber diet. Avoiding snacks and sticking to a meal schedule. Reading  food labels, portion sizes and balance of higher fiber and more lower carb vegetables. Meal planning, Healthy Choice or Smart Ones meals for taking to work, avoiding snacks and benefits of exercise outside her job. Importance of drinking water and risks for DM.  Goals 1. Follow Plate Method 2. Increase fresh fruits and vegetables. 3. Increase fiber rich foods 4. Drink more water 6. Lose 1-2 lbs per week. 5. Increase physical activity to 30 minutes three times per week. Read food labels.. 6 Keep food journal daily.    Teaching Method Utilized:  Visual Auditory Hands on  Handouts given during visit include:  The Plate Method  Successful weight loss tips  Meal Plan Card  Barriers to learning/adherence to lifestyle change: None  Demonstrated degree of understanding via:  Teach Back   Monitoring/Evaluation:  Dietary intake, exercise, meal planning, and body weight in 1 month(s).

## 2015-02-08 NOTE — Patient Instructions (Addendum)
Goals 1. Follow Plate Method 2. Increase fresh fruits and vegetables. 3. Increase fiber rich foods 4. Drink more water 6. Lose 1-2 lbs per week. 5. Cut out snacks between meals and supper. 6 Keep food journal daily.    Read food labels.

## 2015-02-09 ENCOUNTER — Encounter: Payer: Self-pay | Admitting: Nutrition

## 2015-03-13 ENCOUNTER — Ambulatory Visit: Payer: PRIVATE HEALTH INSURANCE | Admitting: Nutrition

## 2015-03-22 ENCOUNTER — Encounter: Payer: PRIVATE HEALTH INSURANCE | Attending: Internal Medicine | Admitting: Nutrition

## 2015-03-22 ENCOUNTER — Encounter: Payer: Self-pay | Admitting: Nutrition

## 2015-03-22 DIAGNOSIS — Z713 Dietary counseling and surveillance: Secondary | ICD-10-CM | POA: Diagnosis not present

## 2015-03-22 DIAGNOSIS — Z6841 Body Mass Index (BMI) 40.0 and over, adult: Secondary | ICD-10-CM | POA: Diagnosis not present

## 2015-03-22 DIAGNOSIS — I10 Essential (primary) hypertension: Secondary | ICD-10-CM | POA: Insufficient documentation

## 2015-03-22 NOTE — Progress Notes (Signed)
  Medical Nutrition Therapy:  Appt start time: 1630end time:  1700 Assessment:  Primary concerns today: Obesity. Works at [employer]. Had a lot of extra hours and didn't get  To cook at home and ate more of fast foods. Has thought about gastric bypas but wants to lose weight on her own. Limited physical activity due to her size but willing to start doing chair exercises. Gained 3 lbs since last visit. Diet is excessive in calories and not enough exercise.   Wt Readings from Last 3 Encounters:  03/22/15 349 lb (158.305 kg)  02/08/15 346 lb 9.6 oz (157.217 kg)  01/02/15 352 lb (159.666 kg)   Body mass index is 63.82 kg/(m^2).   Preferred Learning Style:   No preference indicated    Learning Readiness:   Ready  Change in progress   MEDICATIONS: See list   DIETARY INTAKE: 24-hr recall:  B ( AM): Bacon, 3 slices. water Snk ( AM):  L ( PM): rice, zucchini, carrots and corn, water Snk ( PM):  D ( PM): Rice, zucchini and broiled fish, water Snk ( PM):  Skinny girl popcorn Beverages: water, .  Usual physical activity: ADL  Estimated energy needs: 1500 calories 170 g carbohydrates 112 g protein 42 g fat  Progress Towards Goal(s):  In progress.   Nutritional Diagnosis:  NB-1.1 Food and nutrition-related knowledge deficit As related to Morbid obesity.  As evidenced by BMI> 40.    Intervention:  Nutrition counseling and initiated. Discussed Carb Counting as method of portion control, reading food labels, and benefits of increased activity. Low fat, low sodium high fiber diet. Avoiding snacks and sticking to a meal schedule. Reading food labels, portion sizes and balance of higher fiber and more lower carb vegetables. Meal planning, Healthy Choice or Smart Ones meals for taking to work, avoiding snacks and benefits of exercise outside her job. Importance of drinking water and risks for DM.  Goals 1. Follow Plate Method 2. Cut out wings and bacon, rice and pasta. 3. Increase  fruit and low carb vegetables. 4. Cut out the snacks--low carb veggies onlyl. 5. Chair exercise 15 minutes per day. 6. Lose 1 lb per week til next visit.  Increase fruit and low carb vegetiTeaching Method Utilized:  Visual Auditory Hands on  Handouts given during visit include:  The Plate Method  Successful weight loss tips  Meal Plan Card  Barriers to learning/adherence to lifestyle change: None  Demonstrated degree of understanding via:  Teach Back   Monitoring/Evaluation:  Dietary intake, exercise, meal planning, and body weight in 1 month(s).

## 2015-03-22 NOTE — Patient Instructions (Signed)
Goals 1. Follow Plate Method 2. Cut out wings and bacon. 3. Increase fruit and low carb vegetables. 4. Cut out the snacks--low carb veggies onlyl. 5. Chair exercise 15 minutes per day. 6. Lose 1 lb per week til next visit.

## 2015-04-26 ENCOUNTER — Encounter: Payer: No Typology Code available for payment source | Attending: Internal Medicine | Admitting: Nutrition

## 2015-04-26 ENCOUNTER — Encounter: Payer: Self-pay | Admitting: Nutrition

## 2015-04-26 DIAGNOSIS — Z713 Dietary counseling and surveillance: Secondary | ICD-10-CM | POA: Diagnosis not present

## 2015-04-26 DIAGNOSIS — Z6841 Body Mass Index (BMI) 40.0 and over, adult: Secondary | ICD-10-CM | POA: Insufficient documentation

## 2015-04-26 DIAGNOSIS — I10 Essential (primary) hypertension: Secondary | ICD-10-CM | POA: Diagnosis not present

## 2015-04-26 NOTE — Patient Instructions (Signed)
Goals 1. Follow Plate Method 2. Lose 1 lb per week. 3. Walk 15 minutes three times a week.. 4.  Increase Fiber in diet. 5. Limit ranch dressing with salads 6. Increase low carb vegetables for fiber and filling effect 7. No snacks between meals unless a vegetables.

## 2015-04-26 NOTE — Progress Notes (Signed)
  Medical Nutrition Therapy:  Appt start time: 0900 end time:  930 Assessment:  Primary concerns today: Obesity.  Says she is frustrated because she is use to counting calories and getting confused on food groups and carbs. Hasn't been able to walk extra outside her job yet. Says she gets a lot of walking at work but needs to walk without basket to hold on to.  Gained 1 lb since last visit. Diet is still inconsistent with meals and balanced of food groups. Still snacking at times. She is making progress with better food choices overall but still exceeding her calories. She notes she was walking but now has hemorrhoids and gets constipated. Diet is low in fiber and not enough water.   Wt Readings from Last 3 Encounters:  04/26/15 350 lb (158.759 kg)  03/22/15 349 lb (158.305 kg)  02/08/15 346 lb 9.6 oz (157.217 kg)   Ht Readings from Last 3 Encounters:  04/26/15 5\' 2"  (1.575 m)  03/22/15 5\' 2"  (1.575 m)  02/08/15 5\' 2"  (1.575 m)   Body mass index is 64 kg/(m^2).   Preferred Learning Style:   No preference indicated    Learning Readiness:   Ready  Change in progress   MEDICATIONS: See list   DIETARY INTAKE: 24-hr recall:  B ( AM): Egg sandwich or egg and toast, water Snk ( AM):  fruit L ( PM):Snk ( PM):  Angela hair pasta 1 cup, and shrimp 10, water  Snack: none D ( PM): Chicken tenders baked 3, piece of garlic bread [place] toast, water with crytal light Snk ( PM):  Bowl zupa soup form Olive garlic(sausage, poatoe, spinach, onion in broth)  Beverages: water  Usual physical activity: ADL  Estimated energy needs: 1500 calories 170 g carbohydrates 112 g protein 42 g fat  Progress Towards Goal(s):  In progress.   Nutritional Diagnosis:  NB-1.1 Food and nutrition-related knowledge deficit As related to Morbid obesity.  As evidenced by BMI> 40.    Intervention:  Nutrition counseling and initiated. Discussed Carb Counting as method of portion control, reading food labels,  and benefits of increased activity. Low fat, low sodium high fiber diet. Avoiding snacks and sticking to a meal schedule. Reading food labels, portion sizes and balance of higher fiber and more lower carb vegetables. Meal planning, Healthy Choice or Smart Ones meals for taking to work, avoiding snacks and benefits of exercise outside her job. Importance of drinking water and risks for DM. High fiber diet, weight loss tips, portion control and balance of meals.  Goals 1. Follow Plate Method 2. Lose 1 lb per week. 3. Walk 15 minutes three times a week.. 4.  Increase Fiber in diet. 5. Limit ranch dressing with salads 6. Increase low carb vegetables for fiber and filling effect 7. No snacks between meals unless a vegetables.   Increase fruit and low carb vegetiTeaching Method Utilized:  Visual Auditory Hands on  Handouts given during visit include:  The Plate Method  Successful weight loss tips  Meal Plan Card  Barriers to learning/adherence to lifestyle change: None  Demonstrated degree of understanding via:  Teach Back   Monitoring/Evaluation:  Dietary intake, exercise, meal planning, and body weight in 1 month(s).

## 2015-05-25 ENCOUNTER — Encounter: Payer: No Typology Code available for payment source | Attending: Internal Medicine | Admitting: Nutrition

## 2015-05-25 DIAGNOSIS — Z713 Dietary counseling and surveillance: Secondary | ICD-10-CM | POA: Diagnosis not present

## 2015-05-25 DIAGNOSIS — I10 Essential (primary) hypertension: Secondary | ICD-10-CM | POA: Diagnosis not present

## 2015-05-25 DIAGNOSIS — Z6841 Body Mass Index (BMI) 40.0 and over, adult: Secondary | ICD-10-CM | POA: Insufficient documentation

## 2015-05-25 NOTE — Patient Instructions (Addendum)
Goals: 1. Follow Plate MEthod Increase protein to 3-4 oz per meal. Only choose vegetable or protein for snack between meals. 2. Increase fresh fruits and vegetables. 3. Do not skip meals. 4. Eat 2-3 carb choices per meal. 5. Drink only water 6. Lose 1-2 lbs per week. 7. Avoid high sodium foods that can make you retain fluid and increase blood pressure: salty broths, chinese food, pizza and processed foods.  

## 2015-05-25 NOTE — Progress Notes (Signed)
  Medical Nutrition Therapy:  Appt start time: 1100  end time:  1130 Assessment:  Primary concerns today: Obesity.  Lost 5 lbs since last visit. Has been increasing her fruit intake. Cut out wings and cut down on bacon, egg and cheese biscuits. Has cut down to 4 sodas a month instead of weekly. Not able to increase her physical activity due to knee problems. Has been doing some sit and fit videos a few times per week. Has been sick the last 2 weeks and hasn't eaten much. Doesn't want to do bariatric surgery. Needs to lose 80 lbs before having knee surgery. Limited commitment with diet restrictions for desired weight loss goals. Needs more fiber, fresh vegetables and whole grains. Has been eating chinese food, pizza and other salty foods and limited low carb vegetables.   Wt Readings from Last 3 Encounters:  05/25/15 345 lb (156.491 kg)  04/26/15 350 lb (158.759 kg)  03/22/15 349 lb (158.305 kg)   Ht Readings from Last 3 Encounters:  05/25/15 5\' 2"  (1.575 m)  04/26/15 5\' 2"  (1.575 m)  03/22/15 5\' 2"  (1.575 m)   Body mass index is 63.09 kg/(m^2).   Preferred Learning Style:   No preference indicated    Learning Readiness:   Ready  Change in progress   MEDICATIONS: See list   DIETARY INTAKE: 24-hr recall:  Has been sick for the last 2 weeks and wasn't eating a lot solid food. Been drinking won ton soup and other broths while sick. Appetite getting better slowly: just a 1 slice of pizza this morning for breakfast.  Usual physical activity: ADL  Estimated energy needs: 1500 calories 170 g carbohydrates 112 g protein 42 g fat  Progress Towards Goal(s):  In progress.   Nutritional Diagnosis:  NB-1.1 Food and nutrition-related knowledge deficit As related to Morbid obesity.  As evidenced by BMI> 40.    Intervention:  Nutrition counseling and initiated. Discussed Carb Counting as method of portion control, reading food labels, and benefits of increased activity. Low fat, low  sodium high fiber diet. Avoiding snacks and sticking to a meal schedule. Reading food labels, portion sizes and balance of higher fiber and more lower carb vegetables. Meal planning, Healthy Choice or Smart Ones meals for taking to work, avoiding snacks and benefits of exercise outside her job. Importance of drinking water and risks for DM. High fiber diet, weight loss tips, portion control and balance of meals.  Goals: 1. Follow Plate MEthod Increase protein to 3-4 oz per meal. Only choose vegetable or protein for snack between meals. 2. Increase fresh fruits and vegetables. 3. Do not skip meals. 4. Eat 2-3 carb choices per meal. 5. Drink only water 6. Lose 1-2 lbs per week. 7. Avoid high sodium foods that can make you retain fluid and increase blood pressure: salty broths, chinese food, pizza and processed foods.  Teaching Method Utilized:  Visual Auditory Hands on  Handouts given during visit include:  The Plate Method  Successful weight loss tips  Meal Plan Card  Barriers to learning/adherence to lifestyle change: None  Demonstrated degree of understanding via:  Teach Back   Monitoring/Evaluation:  Dietary intake, exercise, meal planning, and body weight in 1 month(s).

## 2015-07-13 ENCOUNTER — Encounter: Payer: No Typology Code available for payment source | Attending: Internal Medicine | Admitting: Nutrition

## 2015-07-13 ENCOUNTER — Encounter: Payer: Self-pay | Admitting: Nutrition

## 2015-07-13 DIAGNOSIS — Z713 Dietary counseling and surveillance: Secondary | ICD-10-CM | POA: Insufficient documentation

## 2015-07-13 DIAGNOSIS — Z6841 Body Mass Index (BMI) 40.0 and over, adult: Secondary | ICD-10-CM | POA: Diagnosis not present

## 2015-07-13 DIAGNOSIS — I10 Essential (primary) hypertension: Secondary | ICD-10-CM | POA: Insufficient documentation

## 2015-07-13 NOTE — Progress Notes (Signed)
  Medical Nutrition Therapy:  Appt start time: 1100  end time:  1130 Assessment:  Primary concerns today: Obesity.  Weight has stayed the same. Hasn't made many changes with her diet or exercise. Lacks commitment to lose weight by changing her eating habits. Doesn't want gastric bypass because she doesn't want to drink liquids. Doesn't want to give up the foods she likes and prefers. Diet is inconsistent to meet her nutritional needs.   Wt Readings from Last 3 Encounters:  07/13/15 345 lb 9.6 oz (156.763 kg)  05/25/15 345 lb (156.491 kg)  04/26/15 350 lb (158.759 kg)   Ht Readings from Last 3 Encounters:  07/13/15 5\' 2"  (1.575 m)  05/25/15 5\' 2"  (1.575 m)  04/26/15 5\' 2"  (1.575 m)   Body mass index is 63.19 kg/(m^2).   Preferred Learning Style:   No preference indicated    Learning Readiness:   Ready  Change in progress   MEDICATIONS: See list   DIETARY INTAKE: 24-hr recall:  B) Skips breakfast or plain popcorn or magnum bar, water L) Baked chicken, fried rice, water Snacks: 6 vanilla creame cookies and cyrstal light.   Usual physical activity: ADL  Estimated energy needs: 1500 calories 170 g carbohydrates 112 g protein 42 g fat  Progress Towards Goal(s):  In progress.   Nutritional Diagnosis:  NB-1.1 Food and nutrition-related knowledge deficit As related to Morbid obesity.  As evidenced by BMI> 40.    Intervention:  Nutrition counseling and initiated. Discussed Carb Counting as method of portion control, reading food labels, and benefits of increased activity. Low fat, low sodium high fiber diet. Avoiding snacks and sticking to a meal schedule. Reading food labels, portion sizes and balance of higher fiber and more lower carb vegetables. Meal planning, Healthy Choice or Smart Ones meals for taking to work, avoiding snacks and benefits of exercise outside her job. Importance of drinking water and risks for DM. High fiber diet, weight loss tips, portion control  and balance of meals.  Goals: 1. Follow Plate MEthod Increase protein to 3-4 oz per meal. Only choose vegetable or protein for snack between meals. 2. Increase fresh fruits and vegetables. 3. Do not skip meals. 4. Eat 2-3 carb choices per meal. 5. Drink only water 6. Lose 1-2 lbs per week. 7. Avoid high sodium foods that can make you retain fluid and increase blood pressure: salty broths, chinese food, pizza and processed foods.  Teaching Method Utilized:  Visual Auditory Hands on  Handouts given during visit include:  The Plate Method  Successful weight loss tips  Meal Plan Card  Barriers to learning/adherence to lifestyle change: None  Demonstrated degree of understanding via:  Teach Back   Monitoring/Evaluation:  Dietary intake, exercise, meal planning, and body weight in PRN.

## 2015-07-13 NOTE — Patient Instructions (Signed)
Goals: 1. Follow Plate MEthod Increase protein to 3-4 oz per meal. Only choose vegetable or protein for snack between meals. 2. Increase fresh fruits and vegetables. 3. Do not skip meals. 4. Eat 2-3 carb choices per meal. 5. Drink only water 6. Lose 1-2 lbs per week. 7. Avoid high sodium foods that can make you retain fluid and increase blood pressure: salty broths, chinese food, pizza and processed foods.

## 2015-08-23 ENCOUNTER — Ambulatory Visit (INDEPENDENT_AMBULATORY_CARE_PROVIDER_SITE_OTHER): Payer: No Typology Code available for payment source | Admitting: Orthopaedic Surgery

## 2015-08-23 ENCOUNTER — Encounter: Payer: Self-pay | Admitting: Orthopaedic Surgery

## 2015-08-23 VITALS — BP 116/72 | HR 59 | Temp 97.3°F | Ht 62.0 in | Wt 333.6 lb

## 2015-08-23 DIAGNOSIS — M25561 Pain in right knee: Secondary | ICD-10-CM | POA: Diagnosis not present

## 2015-08-23 MED ORDER — HYDROCODONE-ACETAMINOPHEN 7.5-325 MG PO TABS: 1.0000 | ORAL_TABLET | ORAL | Status: DC | PRN

## 2015-08-23 NOTE — Patient Instructions (Addendum)
Rx: for knee brace given. Rx; given for pain medicine.

## 2015-08-23 NOTE — Progress Notes (Signed)
Patient Whitney Bauer, female DOB:02-21-1956, 60 y.o. ION:[id number]  Chief Complaint  Patient presents with  . New Patient (Initial Visit)    Right knee and leg    HPI  Whitney Bauer is a 60 y.o. female who has chronic pain of the right knee.  She has no giving way, no locking but does have swelling and popping.  She cannot walk very far.  She has no redness.  She is asking about a brace for the area below the knee.  I have given a Rx for a knee strap.  HPI  Body mass index is 61 kg/(m^2).  ROS  Review of Systems  HENT: Negative for congestion.   Respiratory: Negative for cough and shortness of breath.   Cardiovascular: Negative for chest pain and leg swelling.  Endocrine: Positive for cold intolerance.  Musculoskeletal: Positive for joint swelling, arthralgias and gait problem.  Allergic/Immunologic: Positive for environmental allergies.    Past Medical History  Diagnosis Date  . Hypertension   . Arthritis     bilat. knees    Past Surgical History  Procedure Laterality Date  . Partial hysterectomy  jan 1999  . Foot surgery    . Colonoscopy N/A 01/06/2014    Procedure: COLONOSCOPY;  Surgeon: Malissa Hippo, MD;  Location: [facility];  Service: Endoscopy;  Laterality: N/A;  830    Family History  Problem Relation Age of Onset  . Asthma Mother   . Asthma Father   . Asthma Sister   . Heart disease Mother   . Cancer Paternal Grandmother     mouth  . Pancreatic cancer Mother   . Colon cancer Father   . Prostate cancer Father   . Ovarian cancer Mother     Social History Social History  Substance Use Topics  . Smoking status: Never Smoker   . Smokeless tobacco: None  . Alcohol Use: None    No Known Allergies  Current Outpatient Prescriptions  Medication Sig Dispense Refill  . atenolol (TENORMIN) 50 MG tablet Take 50 mg by mouth daily.    . chlorthalidone (HYGROTON) 25 MG tablet Take 25 mg by mouth.    . cyclobenzaprine (FLEXERIL) 10 MG tablet Take  10 mg by mouth 3 (three) times daily as needed for muscle spasms.    . cyclobenzaprine (FLEXERIL) 10 MG tablet Take 10 mg by mouth 3 (three) times daily as needed for muscle spasms.    . diclofenac (VOLTAREN) 75 MG EC tablet Take 75 mg by mouth Twice daily.     Marland Kitchen loperamide (IMODIUM) 1 MG/5ML solution Take 6 mg by mouth as needed for diarrhea or loose stools (patient uses at least twice weekly).    Marland Kitchen HYDROcodone-acetaminophen (NORCO) 7.5-325 MG tablet Take 1 tablet by mouth every 4 (four) hours as needed for moderate pain (Must last 30 days.  Do not drive or operate machinery while taking this medicine.). 120 tablet 0   No current facility-administered medications for this visit.     Physical Exam  Blood pressure 116/72, pulse 59, temperature 97.3 F (36.3 C), height 5\' 2"  (1.575 m), weight 333 lb 9.6 oz (151.32 kg).  Constitutional: overall normal hygiene, normal nutrition, well developed, normal grooming, normal body habitus. Assistive device:none  Musculoskeletal: gait and station Limp right, muscle tone and strength are normal, no tremors or atrophy is present.  .  Neurological: coordination overall normal.  Deep tendon reflex/nerve stretch intact.  Sensation normal.  Cranial nerves II-XII intact.   Skin:  normal overall no scars, lesions, ulcers or rashes. No psoriasis.  Psychiatric: Alert and oriented x 3.  Recent memory intact, remote memory unclear.  Normal mood and affect. Well groomed.  Good eye contact.  Cardiovascular: overall no swelling, no varicosities, no edema bilaterally, normal temperatures of the legs and arms, no clubbing, cyanosis and good capillary refill.  Lymphatic: palpation is normal.  The right lower extremity is examined:  Inspection:  Thigh:  Non-tender and no defects  Knee has swelling 2+ effusion.                        Joint tenderness is present                        Patient is tender over the medial joint line  Lower Leg:  Has normal  appearance and no tenderness or defects  Ankle:  Non-tender and no defects  Foot:  Non-tender and no defects Range of Motion:  Knee:  Range of motion is: 0-100                        Crepitus is  present  Ankle:  Range of motion is normal. Strength and Tone:  The right lower extremity has normal strength and tone. Stability:  Knee:  The knee is stable.  Ankle:  The ankle is stable.    The patient has been educated about the nature of the problem(s) and counseled on treatment options.  The patient appeared to understand what I have discussed and is in agreement with it.  Encounter Diagnosis  Name Primary?  . Knee pain, right Yes    PLAN Call if any problems.  Precautions discussed.  Continue current medications.   Return to clinic prn

## 2016-01-02 ENCOUNTER — Ambulatory Visit: Payer: No Typology Code available for payment source | Admitting: Orthopaedic Surgery

## 2016-01-03 ENCOUNTER — Ambulatory Visit: Payer: No Typology Code available for payment source | Admitting: Orthopaedic Surgery

## 2016-01-05 ENCOUNTER — Emergency Department (HOSPITAL_COMMUNITY)
Admission: EM | Admit: 2016-01-05 | Discharge: 2016-01-05 | Disposition: A | Discharge status: Home / Self Care | Payer: No Typology Code available for payment source | Attending: Emergency Medicine | Admitting: Emergency Medicine

## 2016-01-05 ENCOUNTER — Emergency Department (HOSPITAL_COMMUNITY): Payer: No Typology Code available for payment source

## 2016-01-05 ENCOUNTER — Encounter (HOSPITAL_COMMUNITY): Payer: Self-pay

## 2016-01-05 DIAGNOSIS — I10 Essential (primary) hypertension: Secondary | ICD-10-CM | POA: Insufficient documentation

## 2016-01-05 DIAGNOSIS — S99912A Unspecified injury of left ankle, initial encounter: Secondary | ICD-10-CM | POA: Diagnosis present

## 2016-01-05 DIAGNOSIS — Y9301 Activity, walking, marching and hiking: Secondary | ICD-10-CM | POA: Insufficient documentation

## 2016-01-05 DIAGNOSIS — Z79899 Other long term (current) drug therapy: Secondary | ICD-10-CM | POA: Diagnosis not present

## 2016-01-05 DIAGNOSIS — Y990 Civilian activity done for income or pay: Secondary | ICD-10-CM | POA: Insufficient documentation

## 2016-01-05 DIAGNOSIS — S93402A Sprain of unspecified ligament of left ankle, initial encounter: Secondary | ICD-10-CM | POA: Diagnosis not present

## 2016-01-05 DIAGNOSIS — X500XXA Overexertion from strenuous movement or load, initial encounter: Secondary | ICD-10-CM | POA: Diagnosis not present

## 2016-01-05 DIAGNOSIS — Y929 Unspecified place or not applicable: Secondary | ICD-10-CM | POA: Insufficient documentation

## 2016-01-05 MED ORDER — TRAMADOL HCL 50 MG PO TABS: 100.0000 mg | ORAL_TABLET | Freq: Four times a day (QID) | ORAL | 0 refills | Status: DC | PRN

## 2016-01-05 MED ORDER — IBUPROFEN 400 MG PO TABS
600.0000 mg | ORAL_TABLET | Freq: Once | ORAL | Status: AC
  Administered 2016-01-05: 600 mg via ORAL
  Filled 2016-01-05: qty 2

## 2016-01-05 MED ORDER — IBUPROFEN 400 MG PO TABS: 400.0000 mg | ORAL_TABLET | Freq: Four times a day (QID) | ORAL | 0 refills | Status: DC | PRN

## 2016-01-05 NOTE — ED Provider Notes (Signed)
[facility] Provider Note   CSN: [id number] Arrival date & time: 01/05/16  0344     History   Chief Complaint Chief Complaint  Patient presents with  . Ankle Pain    HPI Whitney Bauer is a 60 y.o. female.  HPI SUBJECTIVE: Whitney Bauer is a 60 y.o. female who complains of injury to the left ankle pain 2 hour(s) ago. Pt states that she was just moving whilst at work, and suddenly heard a pop. Immediate symptoms: delayed pain, was able to bear weight directly after injury. Symptoms have been worsening since that time. Prior history of related problems: no prior problems with this area in the past. There is pain and swelling at the lateral aspect of that ankle.   OBJECTIVE: She appears well, vital signs are normal. There is swelling and tenderness over the lateral malleolus. No tenderness over the medial aspect of the ankle. The fifth metatarsal is not tender. The ankle joint is intact without excessive opening on stressing. X-ray: no fracture or dislocation noted The rest of the foot, ankle and leg exam is normal.  ASSESSMENT: Ankle  sprain  PLAN: rest the injured area as much as practical, apply ice packs, compressive bandage, crutches dispensed, prescription for NSAID given, note for work See orders for this visit as documented in the electronic medical record.   Past Medical History:  Diagnosis Date  . Arthritis    bilat. knees  . Hypertension     Patient Active Problem List   Diagnosis Date Noted  . DOE (dyspnea on exertion) 01/28/2011  . Muscle weakness (generalized) 10/23/2010    Past Surgical History:  Procedure Laterality Date  . COLONOSCOPY N/A 01/06/2014   Procedure: COLONOSCOPY;  Surgeon: Malissa Hippo, MD;  Location: [facility];  Service: Endoscopy;  Laterality: N/A;  830  . FOOT SURGERY    . PARTIAL HYSTERECTOMY  jan 1999    OB History    No data available       Home Medications    Prior to Admission medications   Medication Sig  Start Date End Date Taking? Authorizing Provider  atenolol (TENORMIN) 50 MG tablet Take 50 mg by mouth daily.   Yes Historical Provider, MD  chlorthalidone (HYGROTON) 25 MG tablet Take 25 mg by mouth.   Yes Historical Provider, MD  diclofenac (VOLTAREN) 75 MG EC tablet Take 75 mg by mouth Twice daily.  01/19/11  Yes Historical Provider, MD  HYDROcodone-acetaminophen (NORCO) 7.5-325 MG tablet Take 1 tablet by mouth every 4 (four) hours as needed for moderate pain (Must last 30 days.  Do not drive or operate machinery while taking this medicine.). 08/23/15  Yes Darreld Mclean, MD  cyclobenzaprine (FLEXERIL) 10 MG tablet Take 10 mg by mouth 3 (three) times daily as needed for muscle spasms.    Historical Provider, MD  cyclobenzaprine (FLEXERIL) 10 MG tablet Take 10 mg by mouth 3 (three) times daily as needed for muscle spasms.    Historical Provider, MD  loperamide (IMODIUM) 1 MG/5ML solution Take 6 mg by mouth as needed for diarrhea or loose stools (patient uses at least twice weekly).    Historical Provider, MD    Family History Family History  Problem Relation Age of Onset  . Asthma Mother   . Heart disease Mother   . Pancreatic cancer Mother   . Ovarian cancer Mother   . Asthma Father   . Colon cancer Father   . Prostate cancer Father   . Asthma  Sister   . Cancer Paternal Grandmother     mouth    Social History Social History  Substance Use Topics  . Smoking status: Never Smoker  . Smokeless tobacco: Never Used  . Alcohol use No     Allergies   Review of patient's allergies indicates no known allergies.   Review of Systems Review of Systems  Constitutional: Positive for activity change.  Musculoskeletal: Positive for myalgias. Negative for joint swelling.  Skin: Negative for wound.  Allergic/Immunologic: Negative for immunocompromised state.     Physical Exam Updated Vital Signs BP 144/80   Pulse 64   Temp 98 F (36.7 C) (Oral)   Resp 16   Ht 5\' 2"  (1.575 m)   Wt  (!) 340 lb (154.2 kg)   SpO2 100%   BMI 62.19 kg/m   Physical Exam  Constitutional: She is oriented to person, place, and time. She appears well-developed.  HENT:  Head: Normocephalic and atraumatic.  Eyes: EOM are normal.  Neck: Normal range of motion. Neck supple.  Cardiovascular: Normal rate.   Pulmonary/Chest: Effort normal.  Abdominal: Bowel sounds are normal.  Musculoskeletal:  Pt has tenderness above the medial malleolus. No significant tenderness with eversion/inversion and plantar/dorsi flexion.  Neurological: She is alert and oriented to person, place, and time.  Skin: Skin is warm and dry.  Nursing note and vitals reviewed.    ED Treatments / Results  Labs (all labs ordered are listed, but only abnormal results are displayed) Labs Reviewed - No data to display  EKG  EKG Interpretation None       Radiology Dg Ankle Complete Left  Result Date: 01/05/2016 CLINICAL DATA:  60 y/o  F; felt pop in and left ankle with pain. EXAM: LEFT ANKLE COMPLETE - 3+ VIEW COMPARISON:  07/17/2007 ankle MRI. FINDINGS: No acute fracture or dislocation is identified. Intertarsal osteoarthrosis with marginal osteophytes. Plantar calcaneal enthesophyte. No joint effusion. Vascular calcifications. Osteoarthrosis associated with the os navicularis as seen on prior MRI. IMPRESSION: No acute fracture or dislocation is identified. Electronically Signed   By: 07/19/2007 M.D.   On: 01/05/2016 04:40    Procedures Procedures (including critical care time)  Medications Ordered in ED Medications  ibuprofen (ADVIL,MOTRIN) tablet 600 mg (not administered)     Initial Impression / Assessment and Plan / ED Course  I have reviewed the triage vital signs and the nursing notes.  Pertinent labs & imaging results that were available during my care of the patient were reviewed by me and considered in my medical decision making (see chart for details).  Clinical Course    D/c with  RICE. Post op boot, crutches.  Final Clinical Impressions(s) / ED Diagnoses   Final diagnoses:  Sprain of left ankle, unspecified ligament, initial encounter    New Prescriptions New Prescriptions   No medications on file     01/07/2016, MD 01/05/16 [phone]

## 2016-01-05 NOTE — ED Notes (Signed)
Pt returned from xray,  

## 2016-01-05 NOTE — ED Triage Notes (Signed)
Pt states she was at work tonight just walking when she felt a pop in her left ankle, states pain has continued.  Pt denies fall or other trauma

## 2016-01-05 NOTE — ED Notes (Signed)
Patient transported to X-ray 

## 2016-01-16 ENCOUNTER — Telehealth: Payer: Self-pay | Admitting: Orthopaedic Surgery

## 2016-01-16 NOTE — Telephone Encounter (Signed)
Patient called inquiring about appointment following [facility] Emergency room visit, states it is for a work-related injury of left ankle, date of injury and emergency room treatment: 01/05/16.  States has spoken with her Human Resources contact: Rivka Safer, ph [phone]. Employer is [employer]).  Relayed we will need to speak with, and fax our workers comp set up form, which requires signature and claim number. Appointment with Dr Hilda Lias pending.

## 2016-01-18 NOTE — Telephone Encounter (Signed)
On 01/16/16, upon speaking w/patient's supervisor, Ms. Phoebe Sharps at phone# noted, [phone], I faxed Workers comp setup form to her at [employer] [phone].  Patient aware.  On 01/18/16, no response has been received (patient aware). Faxed note to inquire about status.

## 2016-01-26 ENCOUNTER — Other Ambulatory Visit (HOSPITAL_COMMUNITY): Payer: Self-pay | Admitting: Internal Medicine

## 2016-01-26 DIAGNOSIS — M25572 Pain in left ankle and joints of left foot: Secondary | ICD-10-CM

## 2016-01-30 ENCOUNTER — Ambulatory Visit (HOSPITAL_COMMUNITY)
Admission: RE | Admit: 2016-01-30 | Discharge: 2016-01-30 | Disposition: A | Discharge status: Home / Self Care | Payer: No Typology Code available for payment source | Source: Ambulatory Visit | Attending: Internal Medicine | Admitting: Internal Medicine

## 2016-01-30 DIAGNOSIS — M19072 Primary osteoarthritis, left ankle and foot: Secondary | ICD-10-CM | POA: Insufficient documentation

## 2016-01-30 DIAGNOSIS — M25572 Pain in left ankle and joints of left foot: Secondary | ICD-10-CM | POA: Insufficient documentation

## 2016-02-01 NOTE — Telephone Encounter (Signed)
Have had no response to the Workers comp request, per having discussed with patient as noted.  Patient has been seen since, by her primary care, Dr Ouida Sills, who treated the left foot/ankle problem, and has referred to our office for orthopaedic evaluation.  Appointment scheduled accordingly.

## 2016-02-20 ENCOUNTER — Ambulatory Visit (INDEPENDENT_AMBULATORY_CARE_PROVIDER_SITE_OTHER): Payer: No Typology Code available for payment source | Admitting: Orthopaedic Surgery

## 2016-02-20 VITALS — BP 117/75 | HR 64 | Ht 62.0 in | Wt 328.0 lb

## 2016-02-20 DIAGNOSIS — M7752 Other enthesopathy of left foot: Secondary | ICD-10-CM

## 2016-02-20 MED ORDER — HYDROCODONE-ACETAMINOPHEN 7.5-325 MG PO TABS: 1.0000 | ORAL_TABLET | ORAL | 0 refills | Status: DC | PRN

## 2016-02-20 MED ORDER — NAPROXEN 500 MG PO TABS: 500.0000 mg | ORAL_TABLET | Freq: Two times a day (BID) | ORAL | 5 refills | Status: DC

## 2016-02-20 NOTE — Progress Notes (Signed)
Patient WS:FKCL Whitney Bauer, female DOB:May 22, 1955, 60 y.o. EXN:[id number]  Chief Complaint  Patient presents with  . New Problem    Referred by DR. Fagan  for Left ankle and foot pain, abnormal MRI    HPI  Whitney Bauer is a 60 y.o. female who has had increased pain of the left ankle and foot. She saw Dr. Ouida Sills and had x-rays and MRI.  MRI showed: IMPRESSION: Thickening and intrasubstance increased T2 signal in the tibialis posterior tendon consistent with tendinosis. The tendon is intact.  Advanced for age appearing midfoot osteoarthritis worst at the talonavicular joint and articulation of the navicular and medial cuneiform.  Prominent accessory ossicle of the navicular bone with mild irregularity of bone about the synchondrosis but no associated marrow edema.  She has been on diclofenac.  It has helped slightly.  She is better.  She has much less pain of the ankle.  I will have her stop the diclofenac and begin Naprosyn. HPI  Body mass index is 59.99 kg/m.  ROS  Review of Systems  HENT: Negative for congestion.   Respiratory: Negative for cough and shortness of breath.   Cardiovascular: Negative for chest pain and leg swelling.  Endocrine: Positive for cold intolerance.  Musculoskeletal: Positive for arthralgias, gait problem and joint swelling.  Allergic/Immunologic: Positive for environmental allergies.    Past Medical History:  Diagnosis Date  . Arthritis    bilat. knees  . Hypertension     Past Surgical History:  Procedure Laterality Date  . COLONOSCOPY N/A 01/06/2014   Procedure: COLONOSCOPY;  Surgeon: Malissa Hippo, MD;  Location: [facility];  Service: Endoscopy;  Laterality: N/A;  830  . FOOT SURGERY    . PARTIAL HYSTERECTOMY  jan 1999    Family History  Problem Relation Age of Onset  . Asthma Mother   . Heart disease Mother   . Pancreatic cancer Mother   . Ovarian cancer Mother   . Asthma Father   . Colon cancer Father   . Prostate  cancer Father   . Asthma Sister   . Cancer Paternal Grandmother     mouth    Social History Social History  Substance Use Topics  . Smoking status: Never Smoker  . Smokeless tobacco: Never Used  . Alcohol use No    No Known Allergies  Current Outpatient Prescriptions  Medication Sig Dispense Refill  . atenolol (TENORMIN) 50 MG tablet Take 50 mg by mouth daily.    . chlorthalidone (HYGROTON) 25 MG tablet Take 25 mg by mouth.    . cyclobenzaprine (FLEXERIL) 10 MG tablet Take 10 mg by mouth 3 (three) times daily as needed for muscle spasms.    . cyclobenzaprine (FLEXERIL) 10 MG tablet Take 10 mg by mouth 3 (three) times daily as needed for muscle spasms.    Marland Kitchen HYDROcodone-acetaminophen (NORCO) 7.5-325 MG tablet Take 1 tablet by mouth every 4 (four) hours as needed for moderate pain (Must last 30 days.  Do not drive or operate machinery while taking this medicine.). 120 tablet 0  . loperamide (IMODIUM) 1 MG/5ML solution Take 6 mg by mouth as needed for diarrhea or loose stools (patient uses at least twice weekly).    . naproxen (NAPROSYN) 500 MG tablet Take 1 tablet (500 mg total) by mouth 2 (two) times daily with a meal. 60 tablet 5   No current facility-administered medications for this visit.      Physical Exam  Blood pressure 117/75, pulse 64, height 5'  2" (1.575 m), weight (!) 328 lb (148.8 kg).  Constitutional: overall normal hygiene, normal nutrition, well developed, normal grooming, normal body habitus. Assistive device:none  Musculoskeletal: gait and station Limp left, muscle tone and strength are normal, no tremors or atrophy is present.  .  Neurological: coordination overall normal.  Deep tendon reflex/nerve stretch intact.  Sensation normal.  Cranial nerves II-XII intact.   Skin:   Normal overall no scars, lesions, ulcers or rashes. No psoriasis.  Psychiatric: Alert and oriented x 3.  Recent memory intact, remote memory unclear.  Normal mood and affect. Well groomed.   Good eye contact.  Cardiovascular: overall no swelling, no varicosities, no edema bilaterally, normal temperatures of the legs and arms, no clubbing, cyanosis and good capillary refill.  Lymphatic: palpation is normal.  Left ankle has no pain over the posterior tibialis tendon.  She has no swelling.  She said the area had hurt really bad last week.  She has a slight limp.  She has no redness. ROM is full of the left ankle.  Right ankle negative.  The patient has been educated about the nature of the problem(s) and counseled on treatment options.  The patient appeared to understand what I have discussed and is in agreement with it.  Encounter Diagnosis  Name Primary?  . Left ankle tendinitis Yes    PLAN Call if any problems.  Precautions discussed.  Continue current medications.   Return to clinic 2 weeks   Begin Naprosyn. Stop diclofenac.  Electronically Signed Darreld Mclean, MD 11/28/20172:49 PM

## 2016-03-05 ENCOUNTER — Encounter: Payer: Self-pay | Admitting: Orthopaedic Surgery

## 2016-03-05 ENCOUNTER — Ambulatory Visit (INDEPENDENT_AMBULATORY_CARE_PROVIDER_SITE_OTHER): Payer: No Typology Code available for payment source | Admitting: Orthopaedic Surgery

## 2016-03-05 VITALS — BP 115/71 | HR 60 | Temp 97.3°F | Ht 64.0 in | Wt 334.0 lb

## 2016-03-05 DIAGNOSIS — M7752 Other enthesopathy of left foot: Secondary | ICD-10-CM | POA: Diagnosis not present

## 2016-03-05 MED ORDER — DICLOFENAC SODIUM 75 MG PO TBEC: 75.0000 mg | DELAYED_RELEASE_TABLET | Freq: Two times a day (BID) | ORAL | 2 refills | Status: DC

## 2016-03-05 NOTE — Progress Notes (Signed)
Patient Whitney Bauer, female DOB:11/29/1955, 60 y.o. LGX:[id number]  Chief Complaint  Patient presents with  . Follow-up    LEFT ANKLE    HPI  Whitney Bauer is a 60 y.o. female who has chronic left ankle pain and swelling secondary to posterior tibialis tendinitis.  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  She is stable.  She has pain after standing a while.  She could not take the Naprosyn and would like to go back to diclofenac.  I will do this. HPI  Body mass index is 57.33 kg/m.  ROS  Review of Systems  HENT: Negative for congestion.   Respiratory: Negative for cough and shortness of breath.   Cardiovascular: Negative for chest pain and leg swelling.  Endocrine: Positive for cold intolerance.  Musculoskeletal: Positive for arthralgias, gait problem and joint swelling.  Allergic/Immunologic: Positive for environmental allergies.    Past Medical History:  Diagnosis Date  . Arthritis    bilat. knees  . Hypertension     Past Surgical History:  Procedure Laterality Date  . COLONOSCOPY N/A 01/06/2014   Procedure: COLONOSCOPY;  Surgeon: Malissa Hippo, MD;  Location: [facility];  Service: Endoscopy;  Laterality: N/A;  830  . FOOT SURGERY    . PARTIAL HYSTERECTOMY  jan 1999    Family History  Problem Relation Age of Onset  . Asthma Mother   . Heart disease Mother   . Pancreatic cancer Mother   . Ovarian cancer Mother   . Asthma Father   . Colon cancer Father   . Prostate cancer Father   . Asthma Sister   . Cancer Paternal Grandmother     mouth    Social History Social History  Substance Use Topics  . Smoking status: Never Smoker  . Smokeless tobacco: Never Used  . Alcohol use No    No Known Allergies  Current Outpatient Prescriptions  Medication Sig Dispense Refill  . atenolol (TENORMIN) 50 MG tablet Take 50 mg by mouth daily.    . chlorthalidone (HYGROTON) 25 MG tablet Take 25 mg by mouth.    .  cyclobenzaprine (FLEXERIL) 10 MG tablet Take 10 mg by mouth 3 (three) times daily as needed for muscle spasms.    . cyclobenzaprine (FLEXERIL) 10 MG tablet Take 10 mg by mouth 3 (three) times daily as needed for muscle spasms.    . diclofenac (VOLTAREN) 75 MG EC tablet Take 1 tablet (75 mg total) by mouth 2 (two) times daily with a meal. 60 tablet 2  . HYDROcodone-acetaminophen (NORCO) 7.5-325 MG tablet Take 1 tablet by mouth every 4 (four) hours as needed for moderate pain (Must last 30 days.  Do not drive or operate machinery while taking this medicine.). 120 tablet 0  . loperamide (IMODIUM) 1 MG/5ML solution Take 6 mg by mouth as needed for diarrhea or loose stools (patient uses at least twice weekly).     No current facility-administered medications for this visit.      Physical Exam  Blood pressure 115/71, pulse 60, temperature 97.3 F (36.3 C), height 5\' 4"  (1.626 m), weight (!) 334 lb (151.5 kg).  Constitutional: overall normal hygiene, normal nutrition, well developed, normal grooming, normal body habitus. Assistive device:none  Musculoskeletal: gait and station Limp none, muscle tone and strength are normal, no tremors or atrophy is present.  .  Neurological: coordination overall normal.  Deep tendon reflex/nerve stretch intact.  Sensation normal.  Cranial  nerves II-XII intact.   Skin:   Normal overall no scars, lesions, ulcers or rashes. No psoriasis.  Psychiatric: Alert and oriented x 3.  Recent memory intact, remote memory unclear.  Normal mood and affect. Well groomed.  Good eye contact.  Cardiovascular: overall no swelling, no varicosities, no edema bilaterally, normal temperatures of the legs and arms, no clubbing, cyanosis and good capillary refill.  Lymphatic: palpation is normal.  She is not limping today.  She has swelling of the medial ankle with some tenderness of the posterior tibialis tendon but no redness.  NV intact.  The patient has been educated about the  nature of the problem(s) and counseled on treatment options.  The patient appeared to understand what I have discussed and is in agreement with it.  Encounter Diagnoses  Name Primary?  . Left ankle tendinitis Yes  . Morbid obesity due to excess calories [facility])     PLAN Call if any problems.  Precautions discussed.  Continue current medications.   Return to clinic 2 months   Electronically Signed Darreld Mclean, MD 12/12/20179:28 AM

## 2016-03-12 ENCOUNTER — Other Ambulatory Visit (HOSPITAL_COMMUNITY): Payer: Self-pay | Admitting: Internal Medicine

## 2016-03-12 DIAGNOSIS — Z1231 Encounter for screening mammogram for malignant neoplasm of breast: Secondary | ICD-10-CM

## 2016-04-01 ENCOUNTER — Ambulatory Visit (HOSPITAL_COMMUNITY)
Admission: RE | Admit: 2016-04-01 | Discharge: 2016-04-01 | Disposition: A | Discharge status: Home / Self Care | Payer: No Typology Code available for payment source | Source: Ambulatory Visit | Attending: Internal Medicine | Admitting: Internal Medicine

## 2016-04-01 DIAGNOSIS — Z1231 Encounter for screening mammogram for malignant neoplasm of breast: Secondary | ICD-10-CM | POA: Diagnosis not present

## 2016-04-03 ENCOUNTER — Ambulatory Visit (HOSPITAL_COMMUNITY): Payer: No Typology Code available for payment source

## 2016-05-07 ENCOUNTER — Ambulatory Visit (INDEPENDENT_AMBULATORY_CARE_PROVIDER_SITE_OTHER): Payer: No Typology Code available for payment source | Admitting: Orthopaedic Surgery

## 2016-05-07 ENCOUNTER — Encounter: Payer: Self-pay | Admitting: Orthopaedic Surgery

## 2016-05-07 ENCOUNTER — Ambulatory Visit (INDEPENDENT_AMBULATORY_CARE_PROVIDER_SITE_OTHER): Payer: No Typology Code available for payment source

## 2016-05-07 VITALS — BP 125/68 | HR 67 | Ht 64.0 in | Wt 332.0 lb

## 2016-05-07 DIAGNOSIS — M25561 Pain in right knee: Secondary | ICD-10-CM

## 2016-05-07 DIAGNOSIS — G8929 Other chronic pain: Secondary | ICD-10-CM

## 2016-05-07 MED ORDER — HYDROCODONE-ACETAMINOPHEN 7.5-325 MG PO TABS: ORAL_TABLET | ORAL | 0 refills | Status: DC

## 2016-05-07 NOTE — Progress Notes (Signed)
Patient WT:UUEK Whitney Bauer, female DOB:12/14/1955, 61 y.o. CMK:[id number]  Chief Complaint  Patient presents with  . Follow-up    left ankle  . Knee Pain    right knee pain    HPI  Whitney Bauer is a 61 y.o. female who has chronic pain of the right knee.  It is now having problems of not fully extending at times and feeling it may give way.  She has pain after sitting a while.  The pain is medially and posteriorly.  She has no new trauma.  She is taking her diclofenac.   HPI  Body mass index is 56.99 kg/m.  ROS  Review of Systems  HENT: Negative for congestion.   Respiratory: Negative for cough and shortness of breath.   Cardiovascular: Negative for chest pain and leg swelling.  Endocrine: Positive for cold intolerance.  Musculoskeletal: Positive for arthralgias, gait problem and joint swelling.  Allergic/Immunologic: Positive for environmental allergies.    Past Medical History:  Diagnosis Date  . Arthritis    bilat. knees  . Hypertension     Past Surgical History:  Procedure Laterality Date  . COLONOSCOPY N/A 01/06/2014   Procedure: COLONOSCOPY;  Surgeon: Malissa Hippo, MD;  [facility];  Service: Endoscopy;  Laterality: N/A;  830  . FOOT SURGERY    . PARTIAL HYSTERECTOMY  jan 1999    Family History  Problem Relation Age of Onset  . Asthma Mother   . Heart disease Mother   . Pancreatic cancer Mother   . Ovarian cancer Mother   . Asthma Father   . Colon cancer Father   . Prostate cancer Father   . Asthma Sister   . Cancer Paternal Grandmother     mouth    Social History Social History  Substance Use Topics  . Smoking status: Never Smoker  . Smokeless tobacco: Never Used  . Alcohol use No    No Known Allergies  Current Outpatient Prescriptions  Medication Sig Dispense Refill  . atenolol (TENORMIN) 50 MG tablet Take 50 mg by mouth daily.    . chlorthalidone (HYGROTON) 25 MG tablet Take 25 mg by mouth.    . cyclobenzaprine (FLEXERIL) 10  MG tablet Take 10 mg by mouth 3 (three) times daily as needed for muscle spasms.    . cyclobenzaprine (FLEXERIL) 10 MG tablet Take 10 mg by mouth 3 (three) times daily as needed for muscle spasms.    . diclofenac (VOLTAREN) 75 MG EC tablet Take 1 tablet (75 mg total) by mouth 2 (two) times daily with a meal. 60 tablet 2  . HYDROcodone-acetaminophen (NORCO) 7.5-325 MG tablet One every four hours for pain as needed.  Do not drive car or operate machinery while taking this medicine.  Must last 14 days. 56 tablet 0  . loperamide (IMODIUM) 1 MG/5ML solution Take 6 mg by mouth as needed for diarrhea or loose stools (patient uses at least twice weekly).     No current facility-administered medications for this visit.      Physical Exam  Blood pressure 125/68, pulse 67, height 5\' 4"  (1.626 m), weight (!) 332 lb (150.6 kg).  Constitutional: overall normal hygiene, normal nutrition, well developed, normal grooming, normal body habitus. Assistive device:none  Musculoskeletal: gait and station Limp right, muscle tone and strength are normal, no tremors or atrophy is present.  .  Neurological: coordination overall normal.  Deep tendon reflex/nerve stretch intact.  Sensation normal.  Cranial nerves II-XII intact.   Skin:  Normal overall no scars, lesions, ulcers or rashes. No psoriasis.  Psychiatric: Alert and oriented x 3.  Recent memory intact, remote memory unclear.  Normal mood and affect. Well groomed.  Good eye contact.  Cardiovascular: overall no swelling, no varicosities, no edema bilaterally, normal temperatures of the legs and arms, no clubbing, cyanosis and good capillary refill.  Lymphatic: palpation is normal.  The right lower extremity is examined:  Inspection:  Thigh:  Non-tender and no defects  Knee has swelling 1+ effusion.                        Joint tenderness is present                        Patient is tender over the medial joint line  Lower Leg:  Has normal appearance  and no tenderness or defects  Ankle:  Non-tender and no defects  Foot:  Non-tender and no defects Range of Motion:  Knee:  Range of motion is: 0-100                        Crepitus is  present  Ankle:  Range of motion is normal. Strength and Tone:  The right lower extremity has normal strength and tone. Stability:  Knee:  The knee is stable.  Ankle:  The ankle is stable.    The patient has been educated about the nature of the problem(s) and counseled on treatment options.  The patient appeared to understand what I have discussed and is in agreement with it.  Encounter Diagnosis  Name Primary?  . Chronic pain of right knee Yes    PLAN Call if any problems.  Precautions discussed.  Continue current medications.   Return to clinic 1 month   I have reviewed the [facility] Controlled Substance Reporting System web site prior to prescribing narcotic medicine for this patient.  Electronically Signed Darreld Mclean, MD 2/13/20188:43 AM

## 2016-05-21 ENCOUNTER — Ambulatory Visit (INDEPENDENT_AMBULATORY_CARE_PROVIDER_SITE_OTHER): Payer: No Typology Code available for payment source | Admitting: Orthopaedic Surgery

## 2016-05-21 VITALS — BP 145/88 | HR 61 | Temp 97.3°F | Ht 64.0 in | Wt 335.0 lb

## 2016-05-21 DIAGNOSIS — G8929 Other chronic pain: Secondary | ICD-10-CM

## 2016-05-21 DIAGNOSIS — M25561 Pain in right knee: Secondary | ICD-10-CM | POA: Diagnosis not present

## 2016-05-21 NOTE — Progress Notes (Addendum)
Patient Whitney Bauer, female DOB:1955-05-03, 61 y.o. MVH:[id number]  Chief Complaint  Patient presents with  . Follow-up    Chronic pain right knee    HPI  Whitney Bauer is a 61 y.o. female who has increasing pain of the right knee.  She cannot fully extend it now and it is giving way.  She has swelling and popping.  She is using a cane now.  I will get a MRI of the knee.  I am concerned about a meniscus tear.  She may need surgery. HPI  Body mass index is 57.5 kg/m.  ROS  Review of Systems  HENT: Negative for congestion.   Respiratory: Negative for cough and shortness of breath.   Cardiovascular: Negative for chest pain and leg swelling.  Endocrine: Positive for cold intolerance.  Musculoskeletal: Positive for arthralgias, gait problem and joint swelling.  Allergic/Immunologic: Positive for environmental allergies.    Past Medical History:  Diagnosis Date  . Arthritis    bilat. knees  . Hypertension     Past Surgical History:  Procedure Laterality Date  . COLONOSCOPY N/A 01/06/2014   Procedure: COLONOSCOPY;  Surgeon: Malissa Hippo, MD;  [facility];  Service: Endoscopy;  Laterality: N/A;  830  . FOOT SURGERY    . PARTIAL HYSTERECTOMY  jan 1999    Family History  Problem Relation Age of Onset  . Asthma Mother   . Heart disease Mother   . Pancreatic cancer Mother   . Ovarian cancer Mother   . Asthma Father   . Colon cancer Father   . Prostate cancer Father   . Asthma Sister   . Cancer Paternal Grandmother     mouth    Social History Social History  Substance Use Topics  . Smoking status: Never Smoker  . Smokeless tobacco: Never Used  . Alcohol use No    No Known Allergies  Current Outpatient Prescriptions  Medication Sig Dispense Refill  . atenolol (TENORMIN) 50 MG tablet Take 50 mg by mouth daily.    . chlorthalidone (HYGROTON) 25 MG tablet Take 25 mg by mouth.    . cyclobenzaprine (FLEXERIL) 10 MG tablet Take 10 mg by mouth 3  (three) times daily as needed for muscle spasms.    . cyclobenzaprine (FLEXERIL) 10 MG tablet Take 10 mg by mouth 3 (three) times daily as needed for muscle spasms.    . diclofenac (VOLTAREN) 75 MG EC tablet Take 1 tablet (75 mg total) by mouth 2 (two) times daily with a meal. 60 tablet 2  . HYDROcodone-acetaminophen (NORCO) 7.5-325 MG tablet One every four hours for pain as needed.  Do not drive car or operate machinery while taking this medicine.  Must last 14 days. 56 tablet 0  . loperamide (IMODIUM) 1 MG/5ML solution Take 6 mg by mouth as needed for diarrhea or loose stools (patient uses at least twice weekly).     No current facility-administered medications for this visit.      Physical Exam  Blood pressure (!) 145/88, pulse 61, temperature 97.3 F (36.3 C), height 5\' 4"  (1.626 m), weight (!) 335 lb (152 kg).  Constitutional: overall normal hygiene, normal nutrition, well developed, normal grooming, normal body habitus. Assistive device:cane  Musculoskeletal: gait and station Limp right, muscle tone and strength are normal, no tremors or atrophy is present.  .  Neurological: coordination overall normal.  Deep tendon reflex/nerve stretch intact.  Sensation normal.  Cranial nerves II-XII intact.   Skin:  Normal overall no scars, lesions, ulcers or rashes. No psoriasis.  Psychiatric: Alert and oriented x 3.  Recent memory intact, remote memory unclear.  Normal mood and affect. Well groomed.  Good eye contact.  Cardiovascular: overall no swelling, no varicosities, no edema bilaterally, normal temperatures of the legs and arms, no clubbing, cyanosis and good capillary refill.  Lymphatic: palpation is normal.  The right lower extremity is examined:  Inspection:  Thigh:  Non-tender and no defects  Knee has swelling 2+ effusion.                        Joint tenderness is present                        Patient is tender over the medial joint line  Lower Leg:  Has normal appearance  and no tenderness or defects  Ankle:  Non-tender and no defects  Foot:  Non-tender and no defects Range of Motion:  Knee:  Range of motion is: 5 to 90 with inability to fully extend the knee.                        Crepitus is  present  Ankle:  Range of motion is normal. Strength and Tone:  The right lower extremity has normal strength and tone. Stability:  Knee:  The knee has positive medial McMurray  Ankle:  The ankle is stable.    The patient has been educated about the nature of the problem(s) and counseled on treatment options.  The patient appeared to understand what I have discussed and is in agreement with it.  Encounter Diagnosis  Name Primary?  . Chronic pain of right knee Yes    PLAN Call if any problems.  Precautions discussed.  Continue current medications.   Return to clinic after MRI of the right knee.   Electronically Signed Darreld Mclean, MD 2/27/201810:44 AM  Before leaving, she then asked to have me inject the right knee.  It had helped before and she said she was in a lot of pain.  PROCEDURE NOTE:  The patient requests injections of the right knee , verbal consent was obtained.  The right knee was prepped appropriately after time out was performed.   Sterile technique was observed and injection of 1 cc of Depo-Medrol 40 mg with several cc's of plain xylocaine. Anesthesia was provided by ethyl chloride and a 20-gauge needle was used to inject the knee area. The injection was tolerated well.  A band aid dressing was applied.  The patient was advised to apply ice later today and tomorrow to the injection sight as needed.  Electronically Signed Darreld Mclean, MD 2/27/201810:51 AM

## 2016-05-30 ENCOUNTER — Ambulatory Visit
Admission: RE | Admit: 2016-05-30 | Discharge: 2016-05-30 | Disposition: A | Discharge status: Home / Self Care | Payer: No Typology Code available for payment source | Source: Ambulatory Visit | Attending: Orthopaedic Surgery | Admitting: Orthopaedic Surgery

## 2016-05-30 DIAGNOSIS — M25561 Pain in right knee: Principal | ICD-10-CM

## 2016-05-30 DIAGNOSIS — G8929 Other chronic pain: Secondary | ICD-10-CM

## 2016-06-04 ENCOUNTER — Ambulatory Visit: Payer: No Typology Code available for payment source | Admitting: Orthopaedic Surgery

## 2016-06-05 ENCOUNTER — Ambulatory Visit (INDEPENDENT_AMBULATORY_CARE_PROVIDER_SITE_OTHER): Payer: No Typology Code available for payment source | Admitting: Orthopaedic Surgery

## 2016-06-05 ENCOUNTER — Encounter: Payer: Self-pay | Admitting: Orthopaedic Surgery

## 2016-06-05 VITALS — BP 122/67 | HR 67 | Ht 64.0 in | Wt 331.0 lb

## 2016-06-05 DIAGNOSIS — G8929 Other chronic pain: Secondary | ICD-10-CM

## 2016-06-05 DIAGNOSIS — M25561 Pain in right knee: Secondary | ICD-10-CM | POA: Diagnosis not present

## 2016-06-05 NOTE — Progress Notes (Signed)
Patient Whitney Bauer, female DOB:[birth year], 61 y.o. GYI:[id number]  Chief Complaint  Patient presents with  . Follow-up    mri review right knee    HPI  Whitney Bauer is a 61 y.o. female who has had right knee pain that has gotten much worse recently.  She had a MRI of the knee done showing: IMPRESSION: Dominant finding is severe tricompartmental osteoarthritis. Associated degeneration and complex tearing in the body of the medial meniscus is noted.  Mucoid degeneration of the ACL.  Negative for fracture or other evidence of trauma.  I have explained the findings to her.  She is a candidate for total knee but may do well from an arthroscopy to "buy her some time".  She prefers the arthroscopy.  I will have her see Dr. Romeo Apple for his opinion.  I told her [facility] total knee. HPI  Body mass index is 56.82 kg/m.  ROS  Review of Systems  Past Medical History:  Diagnosis Date  . Arthritis    bilat. knees  . Hypertension     Past Surgical History:  Procedure Laterality Date  . COLONOSCOPY N/A 01/06/2014   Procedure: COLONOSCOPY;  Surgeon: Malissa Hippo, MD;  [facility];  Service: Endoscopy;  Laterality: N/A;  830  . FOOT SURGERY    . PARTIAL HYSTERECTOMY  jan 1999    Family History  Problem Relation Age of Onset  . Asthma Mother   . Heart disease Mother   . Pancreatic cancer Mother   . Ovarian cancer Mother   . Asthma Father   . Colon cancer Father   . Prostate cancer Father   . Asthma Sister   . Cancer Paternal Grandmother     mouth    Social History Social History  Substance Use Topics  . Smoking status: Never Smoker  . Smokeless tobacco: Never Used  . Alcohol use No    No Known Allergies  Current Outpatient Prescriptions  Medication Sig Dispense Refill  . atenolol (TENORMIN) 50 MG tablet Take 50 mg by mouth daily.    . chlorthalidone (HYGROTON) 25 MG tablet Take 25 mg by  mouth.    . cyclobenzaprine (FLEXERIL) 10 MG tablet Take 10 mg by mouth 3 (three) times daily as needed for muscle spasms.    . cyclobenzaprine (FLEXERIL) 10 MG tablet Take 10 mg by mouth 3 (three) times daily as needed for muscle spasms.    . diclofenac (VOLTAREN) 75 MG EC tablet Take 1 tablet (75 mg total) by mouth 2 (two) times daily with a meal. 60 tablet 2  . HYDROcodone-acetaminophen (NORCO) 7.5-325 MG tablet One every four hours for pain as needed.  Do not drive car or operate machinery while taking this medicine.  Must last 14 days. 56 tablet 0  . loperamide (IMODIUM) 1 MG/5ML solution Take 6 mg by mouth as needed for diarrhea or loose stools (patient uses at least twice weekly).     No current facility-administered medications for this visit.      Physical Exam  Blood pressure 122/67, pulse 67, height 5\' 4"  (1.626 m), weight (!) 331 lb (150.1 kg).  Constitutional: overall normal hygiene, normal nutrition, well developed, normal grooming, normal body habitus. Assistive device:none  Musculoskeletal: gait and station Limp right, muscle tone and strength are normal, no tremors or atrophy is present.  .  Neurological: coordination overall normal.  Deep tendon reflex/nerve stretch intact.  Sensation  normal.  Cranial nerves II-XII intact.   Skin:   Normal overall no scars, lesions, ulcers or rashes. No psoriasis.  Psychiatric: Alert and oriented x 3.  Recent memory intact, remote memory unclear.  Normal mood and affect. Well groomed.  Good eye contact.  Cardiovascular: overall no swelling, no varicosities, no edema bilaterally, normal temperatures of the legs and arms, no clubbing, cyanosis and good capillary refill.  Lymphatic: palpation is normal.  The right lower extremity is examined:  Inspection:  Thigh:  Non-tender and no defects  Knee has swelling 1+ effusion.                        Joint tenderness is present                        Patient is tender over the medial joint  line  Lower Leg:  Has normal appearance and no tenderness or defects  Ankle:  Non-tender and no defects  Foot:  Non-tender and no defects Range of Motion:  Knee:  Range of motion is: 0-95                        Crepitus is  present  Ankle:  Range of motion is normal. Strength and Tone:  The right lower extremity has normal strength and tone. Stability:  Knee:  The knee has positive medial McMurray.  Ankle:  The ankle is stable.    The patient has been educated about the nature of the problem(s) and counseled on treatment options.  The patient appeared to understand what I have discussed and is in agreement with it.  Encounter Diagnosis  Name Primary?  . Chronic pain of right knee Yes    PLAN Call if any problems.  Precautions discussed.  Continue current medications.   Return to clinic to see Dr. Romeo Apple for possible surgery.   Electronically Signed Darreld Mclean, MD 3/14/20183:56 PM

## 2016-06-12 ENCOUNTER — Ambulatory Visit (INDEPENDENT_AMBULATORY_CARE_PROVIDER_SITE_OTHER): Payer: No Typology Code available for payment source | Admitting: Orthopedic Surgery

## 2016-06-12 ENCOUNTER — Encounter: Payer: Self-pay | Admitting: Orthopedic Surgery

## 2016-06-12 VITALS — BP 141/89 | HR 61 | Ht 64.0 in | Wt 330.0 lb

## 2016-06-12 DIAGNOSIS — G8929 Other chronic pain: Secondary | ICD-10-CM

## 2016-06-12 DIAGNOSIS — M1711 Unilateral primary osteoarthritis, right knee: Secondary | ICD-10-CM

## 2016-06-12 DIAGNOSIS — M25561 Pain in right knee: Secondary | ICD-10-CM | POA: Diagnosis not present

## 2016-06-12 DIAGNOSIS — M23321 Other meniscus derangements, posterior horn of medial meniscus, right knee: Secondary | ICD-10-CM | POA: Diagnosis not present

## 2016-06-12 NOTE — Progress Notes (Signed)
Patient ID: Whitney Bauer, female   DOB: July 15, 1955, 61 y.o.   MRN: [id number]  Chief Complaint  Patient presents with  . Knee Problem    OSTEOARTHRITIS RIGHT KNEE    HPI Whitney Bauer is a 61 y.o. female.  Presents for possible surgery on her right knee. She's had pain in the right knee since around Christmastime when she got out of the car and the door closed on the knee and she twisted it. She had some posterior pain which progressed and eventually caused her to seek medical attention. She was treated with oral anti-inflammatories, analgesics including hydrocodone, and cortisone injection. Her MRI shows torn medial meniscus and arthritis of the knee. X-rays show severe narrowing of the medial compartment  She presents now with intermittent symptoms of posterior knee pain and stiffness after sitting  Review of Systems Review of Systems  Respiratory: Negative for shortness of breath.   Cardiovascular: Negative for chest pain.   (2 MINIMUM)  Past Medical History:  Diagnosis Date  . Arthritis    bilat. knees  . Hypertension     Past Surgical History:  Procedure Laterality Date  . COLONOSCOPY N/A 01/06/2014   Procedure: COLONOSCOPY;  Surgeon: Malissa Hippo, MD;  [facility];  Service: Endoscopy;  Laterality: N/A;  830  . FOOT SURGERY    . PARTIAL HYSTERECTOMY  jan 1999    Social History Social History  Substance Use Topics  . Smoking status: Never Smoker  . Smokeless tobacco: Never Used  . Alcohol use No    No Known Allergies  Current Meds  Medication Sig  . atenolol (TENORMIN) 50 MG tablet Take 50 mg by mouth daily.  . chlorthalidone (HYGROTON) 25 MG tablet Take 25 mg by mouth.  . cyclobenzaprine (FLEXERIL) 10 MG tablet Take 10 mg by mouth 3 (three) times daily as needed for muscle spasms.  . diclofenac (VOLTAREN) 75 MG EC tablet Take 1 tablet (75 mg total) by mouth 2 (two) times daily with a meal.  . HYDROcodone-acetaminophen (NORCO) 7.5-325 MG tablet  One every four hours for pain as needed.  Do not drive car or operate machinery while taking this medicine.  Must last 14 days.  Marland Kitchen loperamide (IMODIUM) 1 MG/5ML solution Take 6 mg by mouth as needed for diarrhea or loose stools (patient uses at least twice weekly).      Physical Exam Physical Exam 1.BP (!) 141/89   Pulse 61   Ht 5\' 4"  (1.626 m)   Wt (!) 330 lb (149.7 kg)   BMI 56.64 kg/m   2. Gen. appearance. The patient is well-developed and well-nourished, grooming and hygiene are normal. There are no gross congenital abnormalities  3. The patient is alert and oriented to person place and time  4. Mood and affect are normal  5. Ambulation Supported by a cane   Examination reveals the following: 6. On inspection we find large pan a callus over the right and left knee. It is difficult to get anatomic landmarks  7. With the range of motion of  full extension in the right knee with flexion of about 100 but some of this is blocked the shape and morphology of the leg and thigh  8. Stability tests were normal  as could be tested  9. Strength tests revealed grade 5 motor strength  10. Skin we find no rash ulceration or erythema  11. Sensation remains intact  12 Vascular system shows no peripheral edema    MEDICAL  DECISION MAKING:    Data Reviewed X-ray and MRI  On plain film we see varus knee with severe joint space narrowing medially and on MRI we see torn macerated medial meniscus and diffuse osteoarthritis primarily medial compartment  Assessment Encounter Diagnoses  Name Primary?  . Morbid obesity due to excess calories (HCC)   . Chronic pain of right knee   . Primary osteoarthritis of right knee Yes  . Derangement of posterior horn of medial meniscus of right knee      Plan At this point I'm advised the patient to go ahead and try to get her BMI down to 40 explained the risks of surgery with a BMI over 40. It is also worth considering seeing a plastic surgeon  about reducing the overhanging fat over the right knee prior to any surgery arthroscopic or knee replacement to prevent wound problems and to allow access into the knee joint  She is not ready for surgery at this time. We also discussed bariatric consult. She says she's been to the [facility] and she does not have enough family support to go through with that, surgery  She will see Dr Hilda Lias in 1 month possible injection   Fuller Canada 06/12/2016, 4:03 PM

## 2016-07-16 ENCOUNTER — Encounter: Payer: Self-pay | Admitting: Orthopaedic Surgery

## 2016-07-16 ENCOUNTER — Ambulatory Visit (INDEPENDENT_AMBULATORY_CARE_PROVIDER_SITE_OTHER): Payer: No Typology Code available for payment source | Admitting: Orthopaedic Surgery

## 2016-07-16 VITALS — BP 118/72 | HR 66 | Temp 97.3°F | Ht 64.0 in | Wt 333.0 lb

## 2016-07-16 DIAGNOSIS — G8929 Other chronic pain: Secondary | ICD-10-CM

## 2016-07-16 DIAGNOSIS — M25561 Pain in right knee: Secondary | ICD-10-CM

## 2016-07-16 DIAGNOSIS — M23321 Other meniscus derangements, posterior horn of medial meniscus, right knee: Secondary | ICD-10-CM

## 2016-07-16 MED ORDER — CELECOXIB 200 MG PO CAPS: 200.0000 mg | ORAL_CAPSULE | Freq: Every day | ORAL | 5 refills | Status: DC

## 2016-07-16 NOTE — Progress Notes (Signed)
Patient Whitney Bauer, female DOB:06/14/1955, 61 y.o. WIO:[id number]  Chief Complaint  Patient presents with  . Follow-up    Chronic pain of right knee    HPI  Whitney Bauer is a 61 y.o. female who has right knee pain, tear of meniscus medially on the right knee and obesity.  She saw Dr. Romeo Apple but she is not ready for surgery at this time.  She has been on diclofenac.  She would like to change to Celebrex.  She had better results on Celebrex.  I will do this. She has no new trauma.  Her right knee still swells and gives way at times. HPI  Body mass index is 57.16 kg/m.  ROS  Review of Systems  HENT: Negative for congestion.   Respiratory: Negative for cough and shortness of breath.   Cardiovascular: Negative for chest pain and leg swelling.  Endocrine: Positive for cold intolerance.  Musculoskeletal: Positive for arthralgias, gait problem and joint swelling.  Allergic/Immunologic: Positive for environmental allergies.    Past Medical History:  Diagnosis Date  . Arthritis    bilat. knees  . Hypertension     Past Surgical History:  Procedure Laterality Date  . COLONOSCOPY N/A 01/06/2014   Procedure: COLONOSCOPY;  Surgeon: Malissa Hippo, MD;  Location: [facility];  Service: Endoscopy;  Laterality: N/A;  830  . FOOT SURGERY    . PARTIAL HYSTERECTOMY  jan 1999    Family History  Problem Relation Age of Onset  . Asthma Mother   . Heart disease Mother   . Pancreatic cancer Mother   . Ovarian cancer Mother   . Asthma Father   . Colon cancer Father   . Prostate cancer Father   . Asthma Sister   . Cancer Paternal Grandmother     mouth    Social History Social History  Substance Use Topics  . Smoking status: Never Smoker  . Smokeless tobacco: Never Used  . Alcohol use No    No Known Allergies  Current Outpatient Prescriptions  Medication Sig Dispense Refill  . atenolol (TENORMIN) 50 MG tablet Take 50 mg by mouth daily.    . celecoxib (CELEBREX) 200  MG capsule Take 1 capsule (200 mg total) by mouth daily. 30 capsule 5  . chlorthalidone (HYGROTON) 25 MG tablet Take 25 mg by mouth.    . cyclobenzaprine (FLEXERIL) 10 MG tablet Take 10 mg by mouth 3 (three) times daily as needed for muscle spasms.    Marland Kitchen HYDROcodone-acetaminophen (NORCO) 7.5-325 MG tablet One every four hours for pain as needed.  Do not drive car or operate machinery while taking this medicine.  Must last 14 days. 56 tablet 0  . loperamide (IMODIUM) 1 MG/5ML solution Take 6 mg by mouth as needed for diarrhea or loose stools (patient uses at least twice weekly).     No current facility-administered medications for this visit.      Physical Exam  Blood pressure 118/72, pulse 66, temperature 97.3 F (36.3 C), height 5\' 4"  (1.626 m), weight (!) 333 lb (151 kg).  Constitutional: overall normal hygiene, normal nutrition, well developed, normal grooming, normal body habitus. Assistive device:none  Musculoskeletal: gait and station Limp right, muscle tone and strength are normal, no tremors or atrophy is present.  .  Neurological: coordination overall normal.  Deep tendon reflex/nerve stretch intact.  Sensation normal.  Cranial nerves II-XII intact.   Skin:   Normal overall no scars, lesions, ulcers or rashes. No psoriasis.  Psychiatric: Alert  and oriented x 3.  Recent memory intact, remote memory unclear.  Normal mood and affect. Well groomed.  Good eye contact.  Cardiovascular: overall no swelling, no varicosities, no edema bilaterally, normal temperatures of the legs and arms, no clubbing, cyanosis and good capillary refill.  Lymphatic: palpation is normal.  The right lower extremity is examined:  Inspection:  Thigh:  Non-tender and no defects  Knee has swelling 2+ effusion.                        Joint tenderness is present                        Patient is tender over the medial joint line  Lower Leg:  Has normal appearance and no tenderness or defects  Ankle:   Non-tender and no defects  Foot:  Non-tender and no defects Range of Motion:  Knee:  Range of motion is: 0-95                        Crepitus is  present  Ankle:  Range of motion is normal. Strength and Tone:  The right lower extremity has normal strength and tone. Stability:  Knee:  The knee is stable.  Ankle:  The ankle is stable.    The patient has been educated about the nature of the problem(s) and counseled on treatment options.  The patient appeared to understand what I have discussed and is in agreement with it.  Encounter Diagnoses  Name Primary?  . Chronic pain of right knee Yes  . Morbid obesity due to excess calories (HCC)   . Derangement of posterior horn of medial meniscus of right knee     PLAN Call if any problems.  Precautions discussed.  Continue current medications.   Return to clinic 6 weeks   Electronically Signed Darreld Mclean, MD 4/24/20188:52 AM

## 2016-08-27 ENCOUNTER — Ambulatory Visit: Payer: No Typology Code available for payment source | Admitting: Orthopaedic Surgery

## 2016-09-03 ENCOUNTER — Telehealth: Payer: Self-pay | Admitting: Orthopaedic Surgery

## 2016-09-03 MED ORDER — DICLOFENAC SODIUM 75 MG PO TBEC: 75.0000 mg | DELAYED_RELEASE_TABLET | Freq: Two times a day (BID) | ORAL | 5 refills | Status: DC

## 2016-09-03 NOTE — Telephone Encounter (Signed)
Patient called stating that you changed her prescription from Diclofenac to Celebrex and that you told her to call the office if the Celebrex didn't work. She is asking if you would change her prescription back to Diclofenac and send it to [facility].

## 2016-09-10 ENCOUNTER — Encounter: Payer: Self-pay | Admitting: Orthopaedic Surgery

## 2016-09-10 ENCOUNTER — Ambulatory Visit (INDEPENDENT_AMBULATORY_CARE_PROVIDER_SITE_OTHER): Payer: No Typology Code available for payment source | Admitting: Orthopaedic Surgery

## 2016-09-10 VITALS — BP 110/78 | HR 61 | Temp 97.7°F | Ht 64.0 in | Wt 334.0 lb

## 2016-09-10 DIAGNOSIS — G8929 Other chronic pain: Secondary | ICD-10-CM

## 2016-09-10 DIAGNOSIS — M23321 Other meniscus derangements, posterior horn of medial meniscus, right knee: Secondary | ICD-10-CM | POA: Insufficient documentation

## 2016-09-10 DIAGNOSIS — M25561 Pain in right knee: Secondary | ICD-10-CM | POA: Diagnosis not present

## 2016-09-10 NOTE — Progress Notes (Signed)
Patient Whitney Bauer, female DOB:Jul 29, 1955, 61 y.o. ZWC:[id number]  Chief Complaint  Patient presents with  . Follow-up    Right Knee    HPI   Bronda C Isaac is a 61 y.o. female who has chronic knee pain.  She is stable.  She has no new trauma.  She has swelling and popping but no giving way.  She could not tolerate the Celebrex and I have changed her back to the diclofenac which works well for her.   HPI  Body mass index is 57.33 kg/m.  ROS  Review of Systems  HENT: Negative for congestion.   Respiratory: Negative for cough and shortness of breath.   Cardiovascular: Negative for chest pain and leg swelling.  Endocrine: Positive for cold intolerance.  Musculoskeletal: Positive for arthralgias, gait problem and joint swelling.  Allergic/Immunologic: Positive for environmental allergies.    Past Medical History:  Diagnosis Date  . Arthritis    bilat. knees  . Hypertension     Past Surgical History:  Procedure Laterality Date  . COLONOSCOPY N/A 01/06/2014   Procedure: COLONOSCOPY;  Surgeon: Malissa Hippo, MD;  Location: [facility];  Service: Endoscopy;  Laterality: N/A;  830  . FOOT SURGERY    . PARTIAL HYSTERECTOMY  jan 1999    Family History  Problem Relation Age of Onset  . Asthma Mother   . Heart disease Mother   . Pancreatic cancer Mother   . Ovarian cancer Mother   . Asthma Father   . Colon cancer Father   . Prostate cancer Father   . Asthma Sister   . Cancer Paternal Grandmother        mouth    Social History Social History  Substance Use Topics  . Smoking status: Never Smoker  . Smokeless tobacco: Never Used  . Alcohol use No    No Known Allergies  Current Outpatient Prescriptions  Medication Sig Dispense Refill  . atenolol (TENORMIN) 50 MG tablet Take 50 mg by mouth daily.    . chlorthalidone (HYGROTON) 25 MG tablet Take 25 mg by mouth.    . cyclobenzaprine (FLEXERIL) 10 MG tablet Take 10 mg by mouth 3 (three) times daily as needed  for muscle spasms.    . diclofenac (VOLTAREN) 75 MG EC tablet Take 1 tablet (75 mg total) by mouth 2 (two) times daily with a meal. 60 tablet 5  . HYDROcodone-acetaminophen (NORCO) 7.5-325 MG tablet One every four hours for pain as needed.  Do not drive car or operate machinery while taking this medicine.  Must last 14 days. 56 tablet 0  . loperamide (IMODIUM) 1 MG/5ML solution Take 6 mg by mouth as needed for diarrhea or loose stools (patient uses at least twice weekly).     No current facility-administered medications for this visit.      Physical Exam  Blood pressure 110/78, pulse 61, temperature 97.7 F (36.5 C), height 5\' 4"  (1.626 m), weight (!) 334 lb (151.5 kg).  Constitutional: overall normal hygiene, normal nutrition, well developed, normal grooming, normal body habitus. Assistive device:cane  Musculoskeletal: gait and station Limp right, muscle tone and strength are normal, no tremors or atrophy is present.  .  Neurological: coordination overall normal.  Deep tendon reflex/nerve stretch intact.  Sensation normal.  Cranial nerves II-XII intact.   Skin:   Normal overall no scars, lesions, ulcers or rashes. No psoriasis.  Psychiatric: Alert and oriented x 3.  Recent memory intact, remote memory unclear.  Normal mood and affect.  Well groomed.  Good eye contact.  Cardiovascular: overall no swelling, no varicosities, no edema bilaterally, normal temperatures of the legs and arms, no clubbing, cyanosis and good capillary refill.  Lymphatic: palpation is normal.  The bilateral lower extremity is examined:  Inspection:  Thigh:  Non-tender and no defects  Knee has swelling 1+ effusion.                        Joint tenderness is present                        Patient is tender over the medial joint line  Lower Leg:  Has normal appearance and no tenderness or defects  Ankle:  Non-tender and no defects  Foot:  Non-tender and no defects Range of Motion:  Knee:  Range of motion  is: 0-100 left, 0-95 right                        Crepitus is  present  Ankle:  Range of motion is normal. Strength and Tone:  The bilateral lower extremity has normal strength and tone. Stability:  Knee:  The knee is stable.  Ankle:  The ankle is stable.    The patient has been educated about the nature of the problem(s) and counseled on treatment options.  The patient appeared to understand what I have discussed and is in agreement with it.  Encounter Diagnoses  Name Primary?  . Chronic pain of right knee Yes  . Morbid obesity due to excess calories (HCC)   . Derangement of posterior horn of medial meniscus of right knee     PLAN Call if any problems.  Precautions discussed.  Continue current medications.   Return to clinic 2 months   Electronically Signed Darreld Mclean, MD 6/19/201810:23 AM

## 2016-10-30 ENCOUNTER — Encounter: Payer: Self-pay | Admitting: Orthopaedic Surgery

## 2016-11-01 ENCOUNTER — Telehealth: Payer: Self-pay | Admitting: *Deleted

## 2016-11-01 NOTE — Telephone Encounter (Signed)
Patient called and states she  needs refill of her HYDROcodone-acetaminophen  Quantity:  56 tablets   One every four hours for pain as needed. Do not drive car or operate machinery while taking this medicine. Must last 14 days.

## 2016-11-04 MED ORDER — HYDROCODONE-ACETAMINOPHEN 7.5-325 MG PO TABS: ORAL_TABLET | ORAL | 0 refills | Status: DC

## 2016-11-13 ENCOUNTER — Ambulatory Visit (INDEPENDENT_AMBULATORY_CARE_PROVIDER_SITE_OTHER): Payer: No Typology Code available for payment source | Admitting: Orthopaedic Surgery

## 2016-11-13 VITALS — BP 102/68 | HR 63 | Temp 97.3°F | Ht 64.0 in | Wt 335.0 lb

## 2016-11-13 DIAGNOSIS — G8929 Other chronic pain: Secondary | ICD-10-CM | POA: Diagnosis not present

## 2016-11-13 DIAGNOSIS — M25561 Pain in right knee: Secondary | ICD-10-CM

## 2016-11-13 MED ORDER — HYDROCODONE-ACETAMINOPHEN 7.5-325 MG PO TABS: 1.0000 | ORAL_TABLET | Freq: Four times a day (QID) | ORAL | 0 refills | Status: DC | PRN

## 2016-11-13 NOTE — Progress Notes (Signed)
Patient Whitney Bauer, female DOB:1955/10/29, 61 y.o. LPF:[id number]  Chief Complaint  Patient presents with  . Follow-up    Bilateral knees    HPI  Whitney Bauer is a 61 y.o. female who has knee pain, more on the right.  She has no new trauma.  She has swelling and popping.  She has no giving way.  She is using her cane. HPI  Body mass index is 57.5 kg/m.  ROS  Review of Systems  HENT: Negative for congestion.   Respiratory: Negative for cough and shortness of breath.   Cardiovascular: Negative for chest pain and leg swelling.  Endocrine: Positive for cold intolerance.  Musculoskeletal: Positive for arthralgias, gait problem and joint swelling.  Allergic/Immunologic: Positive for environmental allergies.    Past Medical History:  Diagnosis Date  . Arthritis    bilat. knees  . Hypertension     Past Surgical History:  Procedure Laterality Date  . COLONOSCOPY N/A 01/06/2014   Procedure: COLONOSCOPY;  Surgeon: Malissa Hippo, MD;  Location: [facility];  Service: Endoscopy;  Laterality: N/A;  830  . FOOT SURGERY    . PARTIAL HYSTERECTOMY  jan 1999    Family History  Problem Relation Age of Onset  . Asthma Mother   . Heart disease Mother   . Pancreatic cancer Mother   . Ovarian cancer Mother   . Asthma Father   . Colon cancer Father   . Prostate cancer Father   . Asthma Sister   . Cancer Paternal Grandmother        mouth    Social History Social History  Substance Use Topics  . Smoking status: Never Smoker  . Smokeless tobacco: Never Used  . Alcohol use No    No Known Allergies  Current Outpatient Prescriptions  Medication Sig Dispense Refill  . atenolol (TENORMIN) 50 MG tablet Take 50 mg by mouth daily.    . chlorthalidone (HYGROTON) 25 MG tablet Take 25 mg by mouth.    . cyclobenzaprine (FLEXERIL) 10 MG tablet Take 10 mg by mouth 3 (three) times daily as needed for muscle spasms.    . diclofenac (VOLTAREN) 75 MG EC tablet Take 1 tablet (75 mg  total) by mouth 2 (two) times daily with a meal. 60 tablet 5  . HYDROcodone-acetaminophen (NORCO) 7.5-325 MG tablet Take 1 tablet by mouth every 6 (six) hours as needed for moderate pain (Must last 30 days.Do not drive a call or operate machinery while on this medicine.). 110 tablet 0  . loperamide (IMODIUM) 1 MG/5ML solution Take 6 mg by mouth as needed for diarrhea or loose stools (patient uses at least twice weekly).     No current facility-administered medications for this visit.      Physical Exam  Blood pressure 102/68, pulse 63, temperature (!) 97.3 F (36.3 C), height 5\' 4"  (1.626 m), weight (!) 335 lb (152 kg).  Constitutional: overall normal hygiene, normal nutrition, well developed, normal grooming, normal body habitus. Assistive device:cane  Musculoskeletal: gait and station Limp right, muscle tone and strength are normal, no tremors or atrophy is present.  .  Neurological: coordination overall normal.  Deep tendon reflex/nerve stretch intact.  Sensation normal.  Cranial nerves II-XII intact.   Skin:   Normal overall no scars, lesions, ulcers or rashes. No psoriasis.  Psychiatric: Alert and oriented x 3.  Recent memory intact, remote memory unclear.  Normal mood and affect. Well groomed.  Good eye contact.  Cardiovascular: overall no swelling, no  varicosities, no edema bilaterally, normal temperatures of the legs and arms, no clubbing, cyanosis and good capillary refill.  Lymphatic: palpation is normal.  The bilateral lower extremity is examined:  Inspection:  Thigh:  Non-tender and no defects  Knee has swelling 1+ effusion.                        Joint tenderness is present                        Patient is tender over the medial joint line  Lower Leg:  Has normal appearance and no tenderness or defects  Ankle:  Non-tender and no defects  Foot:  Non-tender and no defects Range of Motion:  Knee:  Range of motion is: 0-100 right, 0 to 105 left                         Crepitus is  present  Ankle:  Range of motion is normal. Strength and Tone:  The bilateral lower extremity has normal strength and tone. Stability:  Knee:  The knee is stable.  Ankle:  The ankle is stable.    The patient has been educated about the nature of the problem(s) and counseled on treatment options.  The patient appeared to understand what I have discussed and is in agreement with it.  Encounter Diagnoses  Name Primary?  . Chronic pain of right knee Yes  . Morbid obesity due to excess calories [facility])     PLAN Call if any problems.  Precautions discussed.  Continue current medications.   Return to clinic 2 months   I have reviewed the [facility] [place] Controlled Substance Reporting System web site prior to prescribing narcotic medicine for this patient.  Electronically Signed Darreld Mclean, MD 8/22/201811:38 AM

## 2017-01-14 ENCOUNTER — Ambulatory Visit (INDEPENDENT_AMBULATORY_CARE_PROVIDER_SITE_OTHER): Payer: No Typology Code available for payment source | Admitting: Orthopaedic Surgery

## 2017-01-14 ENCOUNTER — Encounter: Payer: Self-pay | Admitting: Orthopaedic Surgery

## 2017-01-14 VITALS — BP 136/81 | HR 60 | Temp 98.4°F | Wt 336.0 lb

## 2017-01-14 DIAGNOSIS — M25562 Pain in left knee: Secondary | ICD-10-CM | POA: Diagnosis not present

## 2017-01-14 DIAGNOSIS — G8929 Other chronic pain: Secondary | ICD-10-CM

## 2017-01-14 DIAGNOSIS — M25561 Pain in right knee: Secondary | ICD-10-CM

## 2017-01-14 NOTE — Progress Notes (Signed)
Patient XL:KGMW Whitney Bauer, female DOB:09-26-1955, 61 y.o. NUU:[id number]  Chief Complaint  Patient presents with  . Follow-up    Recheck on bilateral knee pain.    HPI  Whitney Bauer is a 61 y.o. female who has bilateral knee pain. She has no new trauma.  She has some posterior pain of the left knee more than the right.  She has no giving way or locking.  She is active. She has no redness. HPI  Body mass index is 57.67 kg/m.  ROS  Review of Systems  HENT: Negative for congestion.   Respiratory: Negative for cough and shortness of breath.   Cardiovascular: Negative for chest pain and leg swelling.  Endocrine: Positive for cold intolerance.  Musculoskeletal: Positive for arthralgias, gait problem and joint swelling.  Allergic/Immunologic: Positive for environmental allergies.    Past Medical History:  Diagnosis Date  . Arthritis    bilat. knees  . Hypertension     Past Surgical History:  Procedure Laterality Date  . COLONOSCOPY N/A 01/06/2014   Procedure: COLONOSCOPY;  Surgeon: Malissa Hippo, MD;  [facility];  Service: Endoscopy;  Laterality: N/A;  830  . FOOT SURGERY    . PARTIAL HYSTERECTOMY  jan 1999    Family History  Problem Relation Age of Onset  . Asthma Mother   . Heart disease Mother   . Pancreatic cancer Mother   . Ovarian cancer Mother   . Asthma Father   . Colon cancer Father   . Prostate cancer Father   . Asthma Sister   . Cancer Paternal Grandmother        mouth    Social History Social History  Substance Use Topics  . Smoking status: Never Smoker  . Smokeless tobacco: Never Used  . Alcohol use No    No Known Allergies  Current Outpatient Prescriptions  Medication Sig Dispense Refill  . atenolol (TENORMIN) 50 MG tablet Take 50 mg by mouth daily.    . chlorthalidone (HYGROTON) 25 MG tablet Take 25 mg by mouth.    . cyclobenzaprine (FLEXERIL) 10 MG tablet Take 10 mg by mouth 3 (three) times daily as needed for muscle spasms.     . diclofenac (VOLTAREN) 75 MG EC tablet Take 1 tablet (75 mg total) by mouth 2 (two) times daily with a meal. 60 tablet 5  . HYDROcodone-acetaminophen (NORCO) 7.5-325 MG tablet Take 1 tablet by mouth every 6 (six) hours as needed for moderate pain (Must last 30 days.Do not drive a call or operate machinery while on this medicine.). 110 tablet 0  . loperamide (IMODIUM) 1 MG/5ML solution Take 6 mg by mouth as needed for diarrhea or loose stools (patient uses at least twice weekly).     No current facility-administered medications for this visit.      Physical Exam  Blood pressure 136/81, pulse 60, temperature 98.4 F (36.9 C), weight (!) 336 lb (152.4 kg).  Constitutional: overall normal hygiene, normal nutrition, well developed, normal grooming, normal body habitus. Assistive device:none  Musculoskeletal: gait and station Limp none, muscle tone and strength are normal, no tremors or atrophy is present.  .  Neurological: coordination overall normal.  Deep tendon reflex/nerve stretch intact.  Sensation normal.  Cranial nerves II-XII intact.   Skin:   Normal overall no scars, lesions, ulcers or rashes. No psoriasis.  Psychiatric: Alert and oriented x 3.  Recent memory intact, remote memory unclear.  Normal mood and affect. Well groomed.  Good eye contact.  Cardiovascular: overall no swelling, no varicosities, no edema bilaterally, normal temperatures of the legs and arms, no clubbing, cyanosis and good capillary refill.  Lymphatic: palpation is normal.  All other systems reviewed and are negative   The bilateral lower extremity is examined:  Inspection:  Thigh:  Non-tender and no defects  Knee has swelling 1+ effusion.                        Joint tenderness is present                        Patient is tender over the medial joint line  Lower Leg:  Has normal appearance and no tenderness or defects  Ankle:  Non-tender and no defects  Foot:  Non-tender and no defects Range of  Motion:  Knee:  Range of motion is: 0-100 right, 0 - 1-5 left                        Crepitus is  present  Ankle:  Range of motion is normal. Strength and Tone:  The bilateral lower extremity has normal strength and tone. Stability:  Knee:  The knee is stable.  Ankle:  The ankle is stable.   The patient has been educated about the nature of the problem(s) and counseled on treatment options.  The patient appeared to understand what I have discussed and is in agreement with it.  Encounter Diagnoses  Name Primary?  . Chronic pain of right knee Yes  . Chronic pain of left knee   . Morbid obesity due to excess calories [facility])     PLAN Call if any problems.  Precautions discussed.  Continue current medications.   Return to clinic 3 months   Electronically Signed Darreld Mclean, MD 10/23/20189:56 AM

## 2017-04-08 ENCOUNTER — Ambulatory Visit: Payer: No Typology Code available for payment source | Admitting: Orthopaedic Surgery

## 2017-04-08 ENCOUNTER — Encounter: Payer: Self-pay | Admitting: Orthopaedic Surgery

## 2017-04-08 VITALS — BP 141/85 | HR 62 | Ht 62.0 in | Wt 339.0 lb

## 2017-04-08 DIAGNOSIS — G8929 Other chronic pain: Secondary | ICD-10-CM

## 2017-04-08 DIAGNOSIS — M25562 Pain in left knee: Secondary | ICD-10-CM | POA: Diagnosis not present

## 2017-04-08 DIAGNOSIS — M25561 Pain in right knee: Secondary | ICD-10-CM | POA: Diagnosis not present

## 2017-04-08 NOTE — Progress Notes (Signed)
Patient OH:YWVP Whitney Bauer, female DOB:06/05/1955, 62 y.o. XTG:[id number]  Chief Complaint  Patient presents with  . Follow-up    BILTERAL KNEES    HPI  Whitney Bauer is a 62 y.o. female who has chronic pain of both knees.  The cold weather has made her worse. She has no new trauma, no giving way, no locking.  She is taking her medicine. She is active. HPI  Body mass index is 62 kg/m.  ROS  Review of Systems  HENT: Negative for congestion.   Respiratory: Negative for cough and shortness of breath.   Cardiovascular: Negative for chest pain and leg swelling.  Endocrine: Positive for cold intolerance.  Musculoskeletal: Positive for arthralgias, gait problem and joint swelling.  Allergic/Immunologic: Positive for environmental allergies.  All other systems reviewed and are negative.   Past Medical History:  Diagnosis Date  . Arthritis    bilat. knees  . Hypertension     Past Surgical History:  Procedure Laterality Date  . COLONOSCOPY N/A 01/06/2014   Procedure: COLONOSCOPY;  Surgeon: Malissa Hippo, MD;  [facility];  Service: Endoscopy;  Laterality: N/A;  830  . FOOT SURGERY    . PARTIAL HYSTERECTOMY  jan 1999    Family History  Problem Relation Age of Onset  . Asthma Mother   . Heart disease Mother   . Pancreatic cancer Mother   . Ovarian cancer Mother   . Asthma Father   . Colon cancer Father   . Prostate cancer Father   . Asthma Sister   . Cancer Paternal Grandmother        mouth    Social History Social History   Tobacco Use  . Smoking status: Never Smoker  . Smokeless tobacco: Never Used  Substance Use Topics  . Alcohol use: No  . Drug use: Not on file    No Known Allergies  Current Outpatient Medications  Medication Sig Dispense Refill  . atenolol (TENORMIN) 50 MG tablet Take 50 mg by mouth daily.    . chlorthalidone (HYGROTON) 25 MG tablet Take 25 mg by mouth.    . cyclobenzaprine (FLEXERIL) 10 MG tablet Take 10 mg by mouth 3  (three) times daily as needed for muscle spasms.    . diclofenac (VOLTAREN) 75 MG EC tablet Take 1 tablet (75 mg total) by mouth 2 (two) times daily with a meal. 60 tablet 5  . HYDROcodone-acetaminophen (NORCO) 7.5-325 MG tablet Take 1 tablet by mouth every 6 (six) hours as needed for moderate pain (Must last 30 days.Do not drive a call or operate machinery while on this medicine.). 110 tablet 0  . loperamide (IMODIUM) 1 MG/5ML solution Take 6 mg by mouth as needed for diarrhea or loose stools (patient uses at least twice weekly).     No current facility-administered medications for this visit.      Physical Exam  Blood pressure (!) 141/85, pulse 62, height 5\' 2"  (1.575 m), weight (!) 339 lb (153.8 kg).  Constitutional: overall normal hygiene, normal nutrition, well developed, normal grooming, normal body habitus. Assistive device:none  Musculoskeletal: gait and station Limp left, muscle tone and strength are normal, no tremors or atrophy is present.  .  Neurological: coordination overall normal.  Deep tendon reflex/nerve stretch intact.  Sensation normal.  Cranial nerves II-XII intact.   Skin:   Normal overall no scars, lesions, ulcers or rashes. No psoriasis.  Psychiatric: Alert and oriented x 3.  Recent memory intact, remote memory unclear.  Normal  mood and affect. Well groomed.  Good eye contact.  Cardiovascular: overall no swelling, no varicosities, no edema bilaterally, normal temperatures of the legs and arms, no clubbing, cyanosis and good capillary refill.  Lymphatic: palpation is normal.  The bilateral lower extremity is examined:  Inspection:  Thigh:  Non-tender and no defects  Knee has swelling 1+ effusion.                        Joint tenderness is present                        Patient is tender over the medial joint line  Lower Leg:  Has normal appearance and no tenderness or defects  Ankle:  Non-tender and no defects  Foot:  Non-tender and no defects Range of  Motion:  Knee:  Range of motion is: 0-100 left, 0 to 105 right                        Crepitus is  present  Ankle:  Range of motion is normal. Strength and Tone:  The bilateral lower extremity has normal strength and tone. Stability:  Knee:  The knee is stable.  Ankle:  The ankle is stable.   All other systems reviewed and are negative   The patient has been educated about the nature of the problem(s) and counseled on treatment options.  The patient appeared to understand what I have discussed and is in agreement with it.  Encounter Diagnoses  Name Primary?  . Chronic pain of right knee Yes  . Chronic pain of left knee   . Morbid obesity due to excess calories [facility])     PLAN Call if any problems.  Precautions discussed.  Continue current medications.   Return to clinic 4 months   Electronically Signed Darreld Mclean, MD 1/15/20199:51 AM

## 2017-05-06 ENCOUNTER — Other Ambulatory Visit (HOSPITAL_COMMUNITY): Payer: Self-pay | Admitting: Internal Medicine

## 2017-05-06 DIAGNOSIS — Z1231 Encounter for screening mammogram for malignant neoplasm of breast: Secondary | ICD-10-CM

## 2017-05-14 ENCOUNTER — Ambulatory Visit (HOSPITAL_COMMUNITY)
Admission: RE | Admit: 2017-05-14 | Discharge: 2017-05-14 | Disposition: A | Discharge status: Home / Self Care | Payer: No Typology Code available for payment source | Source: Ambulatory Visit | Attending: Internal Medicine | Admitting: Internal Medicine

## 2017-05-14 ENCOUNTER — Encounter (HOSPITAL_COMMUNITY): Payer: Self-pay

## 2017-05-14 DIAGNOSIS — Z1231 Encounter for screening mammogram for malignant neoplasm of breast: Secondary | ICD-10-CM | POA: Insufficient documentation

## 2017-07-08 ENCOUNTER — Telehealth: Payer: Self-pay | Admitting: Orthopedic Surgery

## 2017-07-08 NOTE — Telephone Encounter (Signed)
Patient last seen by Dr Hilda Lias 04/08/17, called with concern of recurring left foot/ankle pain (had been seen in 2017 for this problem; requests  (1) appointment for possible injection this week if possible   (2) refill of pain medication  HYDROcodone-acetaminophen (NORCO) 7.5-325 MG tablet 1 tablet Oral Every 6 hours PRN   - [facility], [place]  (Aware may need to be seen prior to being approved for refill)

## 2017-07-08 NOTE — Telephone Encounter (Signed)
Can t see this week and cant give rx

## 2017-07-08 NOTE — Telephone Encounter (Signed)
Called patient and relayed; offered next available.  States already has appointment with Dr Hilda Lias for when he returns in May, and will await that appointment.

## 2017-08-05 ENCOUNTER — Encounter: Payer: Self-pay | Admitting: Orthopaedic Surgery

## 2017-08-05 ENCOUNTER — Ambulatory Visit: Payer: No Typology Code available for payment source | Admitting: Orthopaedic Surgery

## 2017-08-05 VITALS — BP 127/81 | HR 58 | Ht 62.0 in | Wt 342.0 lb

## 2017-08-05 DIAGNOSIS — M25561 Pain in right knee: Secondary | ICD-10-CM

## 2017-08-05 DIAGNOSIS — G8929 Other chronic pain: Secondary | ICD-10-CM | POA: Diagnosis not present

## 2017-08-05 MED ORDER — HYDROCODONE-ACETAMINOPHEN 7.5-325 MG PO TABS: 1.0000 | ORAL_TABLET | Freq: Four times a day (QID) | ORAL | 0 refills | Status: DC | PRN

## 2017-08-05 NOTE — Progress Notes (Signed)
Patient Whitney Bauer, female DOB:25-Apr-1955, 62 y.o. DTO:[id number]  Chief Complaint  Patient presents with  . Follow-up    Chronic pain right knee    HPI  Whitney Bauer is a 62 y.o. female who has chronic right knee pain.  She has pain most days.  She has no new trauma, no giving way.  She is morbidly obese. We discussed bariatric surgery today.  She has been to some meeting about this at [facility].  She is thinking she will proceed with it.  Her right knee cannot have any surgery until she is in the proper weight range.  She understands this. HPI  Body mass index is 62.55 kg/m.  ROS  Review of Systems  HENT: Negative for congestion.   Respiratory: Negative for cough and shortness of breath.   Cardiovascular: Negative for chest pain and leg swelling.  Endocrine: Positive for cold intolerance.  Musculoskeletal: Positive for arthralgias, gait problem and joint swelling.  Allergic/Immunologic: Positive for environmental allergies.  All other systems reviewed and are negative.   Past Medical History:  Diagnosis Date  . Arthritis    bilat. knees  . Hypertension     Past Surgical History:  Procedure Laterality Date  . COLONOSCOPY N/A 01/06/2014   Procedure: COLONOSCOPY;  Surgeon: Malissa Hippo, MD;  [facility];  Service: Endoscopy;  Laterality: N/A;  830  . FOOT SURGERY    . PARTIAL HYSTERECTOMY  jan 1999    Family History  Problem Relation Age of Onset  . Asthma Mother   . Heart disease Mother   . Pancreatic cancer Mother   . Ovarian cancer Mother   . Asthma Father   . Colon cancer Father   . Prostate cancer Father   . Asthma Sister   . Cancer Paternal Grandmother        mouth    Social History Social History   Tobacco Use  . Smoking status: Never Smoker  . Smokeless tobacco: Never Used  Substance Use Topics  . Alcohol use: No  . Drug use: Not on file    No Known Allergies  Current Outpatient Medications  Medication Sig  Dispense Refill  . atenolol (TENORMIN) 50 MG tablet Take 50 mg by mouth daily.    . chlorthalidone (HYGROTON) 25 MG tablet Take 25 mg by mouth.    . cyclobenzaprine (FLEXERIL) 10 MG tablet Take 10 mg by mouth 3 (three) times daily as needed for muscle spasms.    . diclofenac (VOLTAREN) 75 MG EC tablet Take 1 tablet (75 mg total) by mouth 2 (two) times daily with a meal. 60 tablet 5  . HYDROcodone-acetaminophen (NORCO) 7.5-325 MG tablet Take 1 tablet by mouth every 6 (six) hours as needed for moderate pain (Must last 30 days.Do not drive a call or operate machinery while on this medicine.). 110 tablet 0  . loperamide (IMODIUM) 1 MG/5ML solution Take 6 mg by mouth as needed for diarrhea or loose stools (patient uses at least twice weekly).     No current facility-administered medications for this visit.      Physical Exam  Blood pressure 127/81, pulse (!) 58, height 5\' 2"  (1.575 m), weight (!) 342 lb (155.1 kg).  Constitutional: overall normal hygiene, normal nutrition, well developed, normal grooming, normal body habitus. Assistive device:none  Musculoskeletal: gait and station Limp right, muscle tone and strength are normal, no tremors or atrophy is present.  .  Neurological: coordination overall normal.  Deep tendon reflex/nerve  stretch intact.  Sensation normal.  Cranial nerves II-XII intact.   Skin:   Normal overall no scars, lesions, ulcers or rashes. No psoriasis.  Psychiatric: Alert and oriented x 3.  Recent memory intact, remote memory unclear.  Normal mood and affect. Well groomed.  Good eye contact.  Cardiovascular: overall no swelling, no varicosities, no edema bilaterally, normal temperatures of the legs and arms, no clubbing, cyanosis and good capillary refill.  Lymphatic: palpation is normal.  The right lower extremity is examined:  Inspection:  Thigh:  Non-tender and no defects  Knee has swelling 1+ effusion.                        Joint tenderness is present                         Patient is tender over the medial joint line  Lower Leg:  Has normal appearance and no tenderness or defects  Ankle:  Non-tender and no defects  Foot:  Non-tender and no defects Range of Motion:  Knee:  Range of motion is: 0-100                        Crepitus is  present  Ankle:  Range of motion is normal. Strength and Tone:  The right lower extremity has normal strength and tone. Stability:  Knee:  The knee is stable.  Ankle:  The ankle is stable.   All other systems reviewed and are negative   The patient has been educated about the nature of the problem(s) and counseled on treatment options.  The patient appeared to understand what I have discussed and is in agreement with it.  Encounter Diagnoses  Name Primary?  . Chronic pain of right knee Yes  . Morbid obesity due to excess calories [facility])     PLAN Call if any problems.  Precautions discussed.  Continue current medications.   Return to clinic 2 months   I have reviewed the [facility] Controlled Substance Reporting System web site prior to prescribing narcotic medicine for this patient.  Electronically Signed Darreld Mclean, MD 5/14/201910:24 AM

## 2017-08-05 NOTE — Addendum Note (Signed)
Addended by: Earnstine Regal on: 08/05/2017 10:30 AM   Modules accepted: Orders

## 2017-08-21 ENCOUNTER — Other Ambulatory Visit: Payer: Self-pay | Admitting: Orthopaedic Surgery

## 2017-08-28 IMAGING — MR MR KNEE*R* W/O CM
7 series · 39 of 40 positions shown · non-contrast
Comparison: Plain films right knee performed at an outside facility
05/07/2016.

CLINICAL DATA: Posterior right knee pain since the patient shut a
car door on the knee on 03/18/2016.

EXAM:
MRI OF THE RIGHT KNEE WITHOUT CONTRAST
TECHNIQUE: Multiplanar, multisequence MR imaging of the knee was performed. No
intravenous contrast was administered.

[Series 4: T1 · axial · right · 3.0mm · 0.69mm/px · z∈[+40,+145]mm · 8 of 35 slices shown (1 of 2)]
[im 1/35]
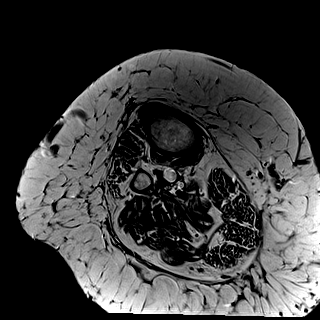
[im 5/35]
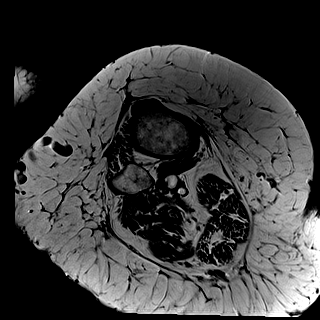
[im 10/35]
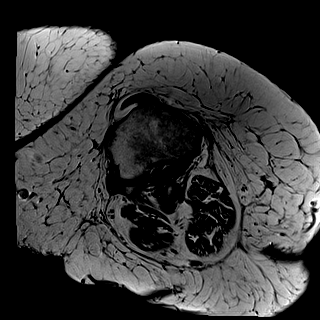
[im 15/35]
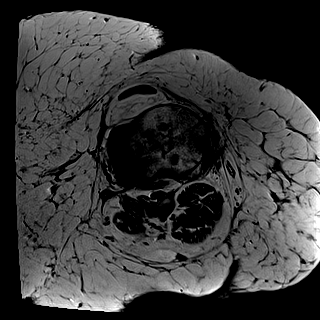
[im 20/35]
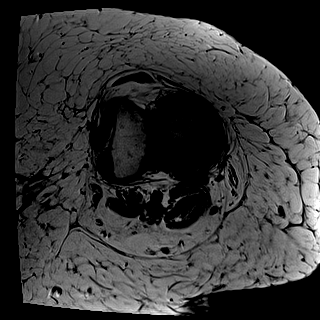
[im 25/35]
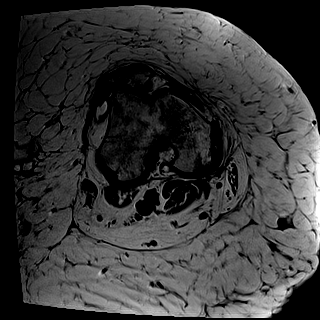
[im 30/35]
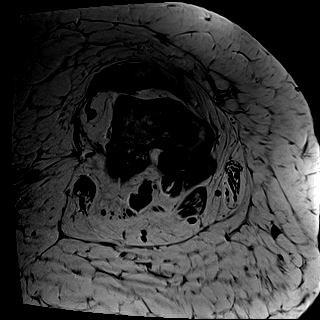
[im 35/35]
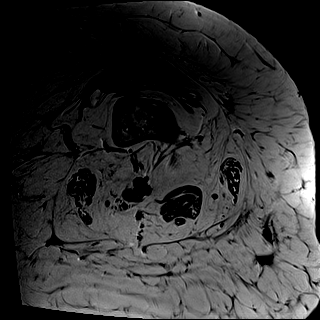

[Series 5: T2 fat-sat · axial · right · 3.0mm · 0.69mm/px · z∈[+40,+145]mm · 7 of 35 slices shown (1 of 2)]
[im 1/35]
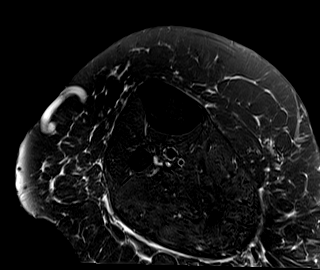
[im 6/35]
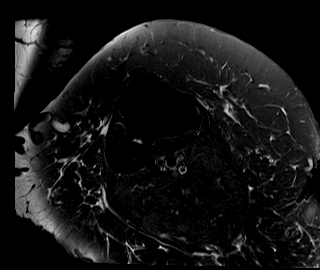
[im 12/35]
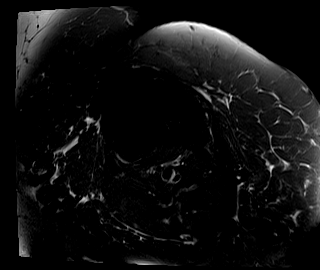
[im 18/35]
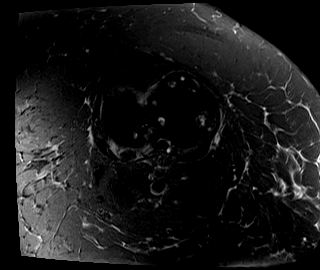
[im 23/35]
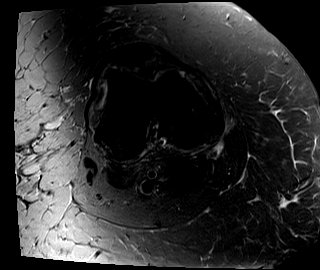
[im 29/35]
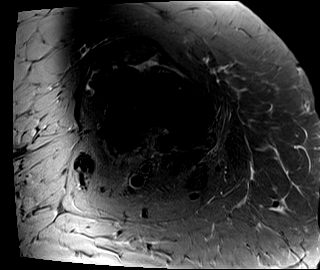
[im 35/35]
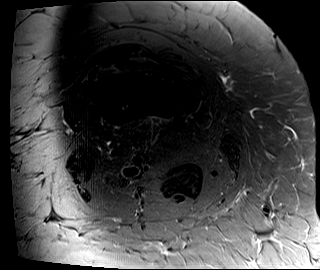

[Series 6: T2 fat-sat · coronal · right · 3.5mm · 0.66mm/px · 5 of 28 slices shown (2 of 2)]
[im 1/28]
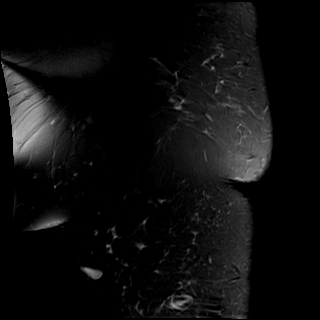
[im 7/28]
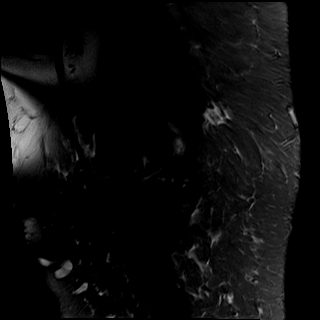
[im 14/28]
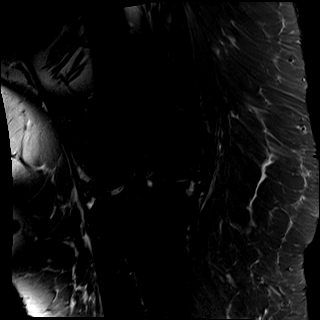
[im 21/28]
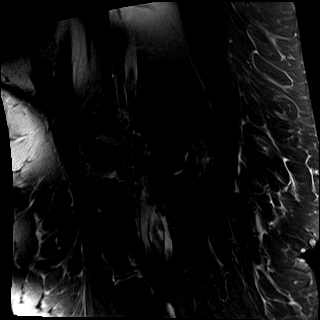
[im 28/28]
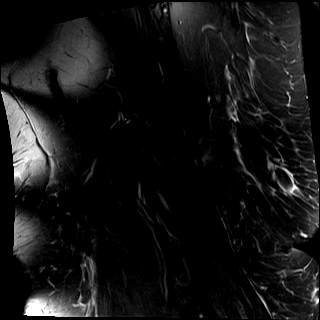

[Series 7: PD fat-sat · sagittal · right · 3.3mm · 0.66mm/px · 6 of 33 slices shown (1 of 2)]
[im 1/33]
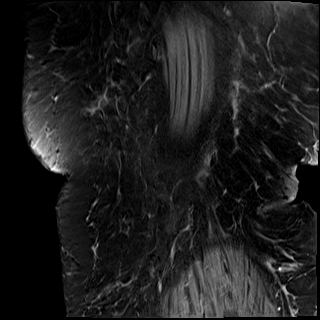
[im 7/33]
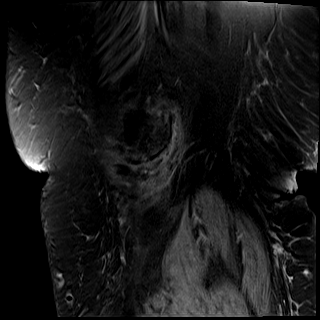
[im 13/33]
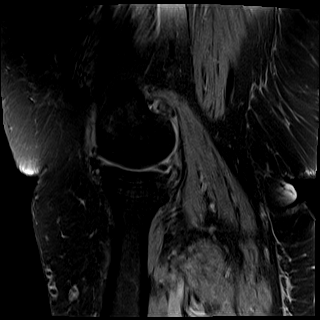
[im 20/33]
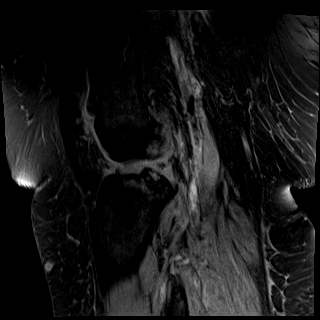
[im 26/33]
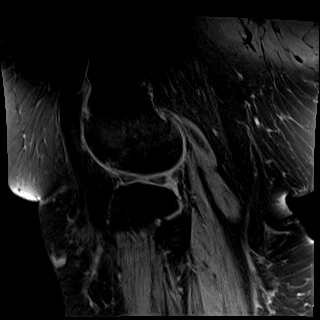
[im 33/33]
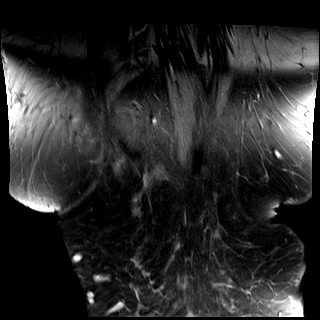

[Series 10: PD fat-sat · coronal · right · 3.5mm · 0.47mm/px · 5 of 28 slices shown (2 of 2)]
[im 1/28]
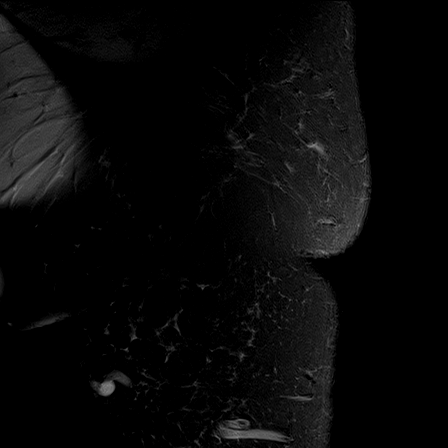
[im 7/28]
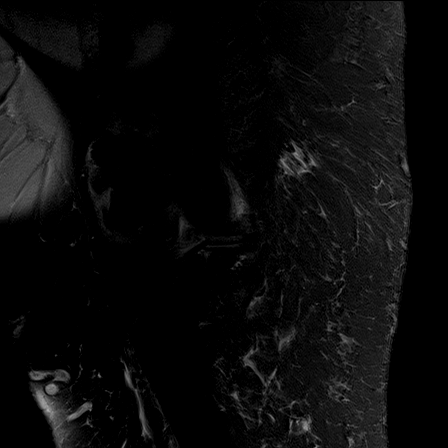
[im 14/28]
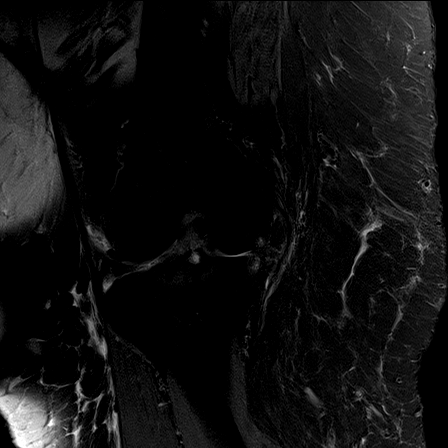
[im 21/28]
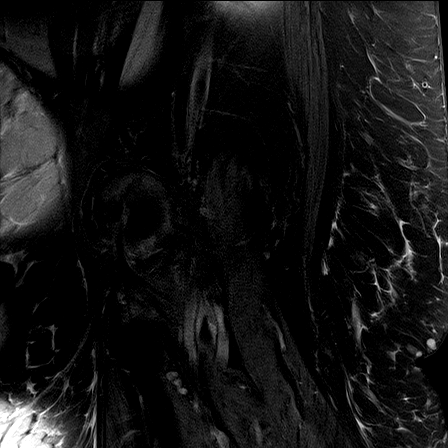
[im 28/28]
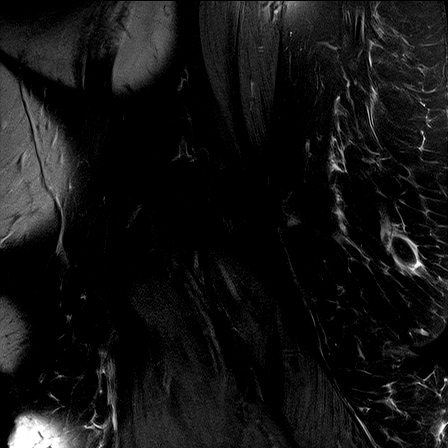

[Series 12: T1 · coronal · right · 3.5mm · 0.47mm/px · 4 of 28 slices shown (2 of 2)]
[im 1/28]
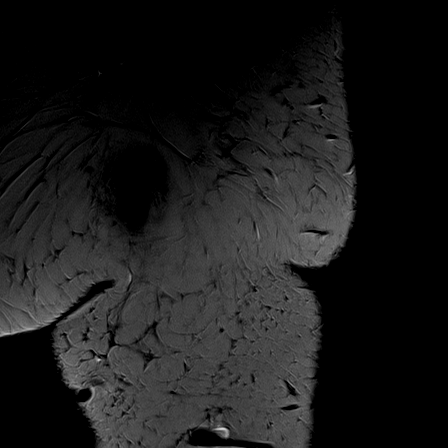
[im 7/28]
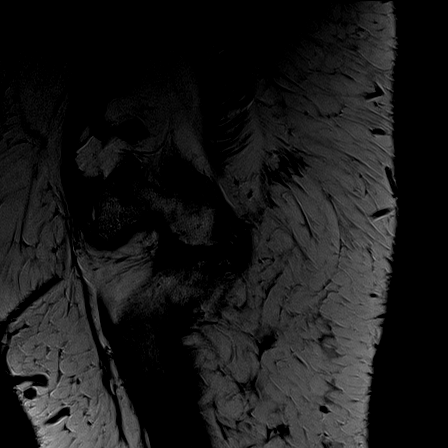
[im 14/28]
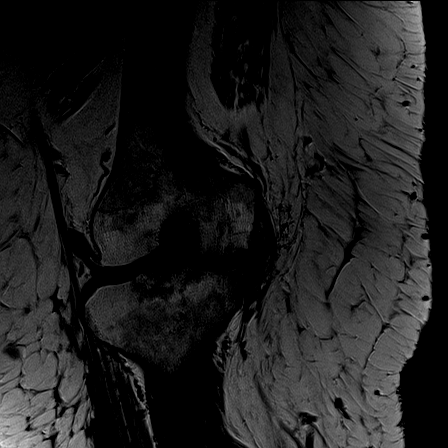
[im 21/28]
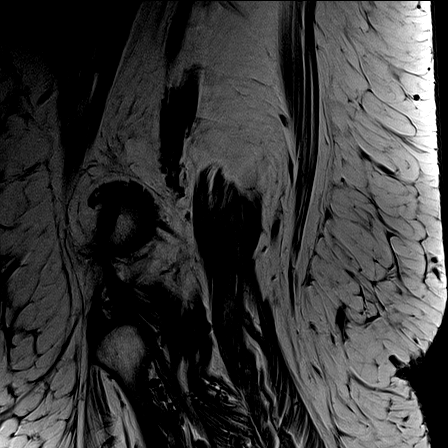

[Series 13: PD · coronal · right · 1.5mm · 0.44mm/px · 4 of 21 slices shown]
[im 1/21]
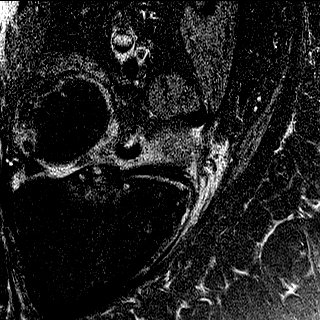
[im 7/21]
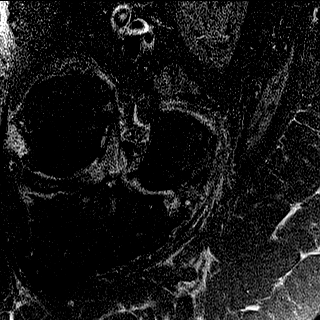
[im 14/21]
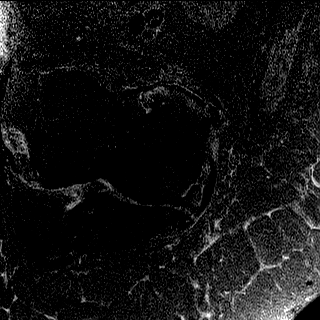
[im 21/21]
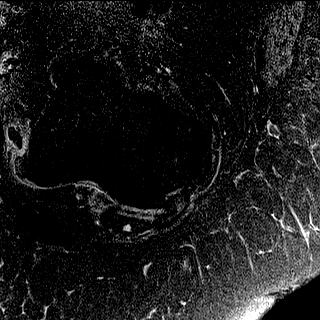

[39 of 40 positions shown; findings below may reference images not displayed]

FINDINGS: MENISCI

Medial meniscus: The meniscus is severely degenerated, particularly
throughout the body which is extruded peripherally. Complex tearing
is seen in the body.

Lateral meniscus:  Intact with degenerative signal noted.

LIGAMENTS

Cruciates:  Intact.  Mucoid degeneration the ACL is seen.

Collaterals:  Intact.

CARTILAGE

Patellofemoral: Extensive, severe cartilage loss is present
diffusely.

Medial: Severely degenerated with bone-on-bone joint space
narrowing.

Lateral:  Moderately degenerated.

Joint:  Very small effusion.

Popliteal Fossa:  No Baker's cyst.

Extensor Mechanism:  Intact.

Bones: Negative for fracture or worrisome lesion. Prominent
tricompartmental osteophytosis is seen. Small subchondral cysts are
noted in the tibial eminences along the periphery of the medial
compartment.

Other: None.
IMPRESSION: Dominant finding is severe tricompartmental osteoarthritis.
Associated degeneration and complex tearing in the body of the
medial meniscus is noted.

Mucoid degeneration of the ACL.

Negative for fracture or other evidence of trauma.

## 2017-09-02 ENCOUNTER — Ambulatory Visit (INDEPENDENT_AMBULATORY_CARE_PROVIDER_SITE_OTHER): Payer: No Typology Code available for payment source | Admitting: Internal Medicine

## 2017-09-02 ENCOUNTER — Encounter (INDEPENDENT_AMBULATORY_CARE_PROVIDER_SITE_OTHER): Payer: Self-pay | Admitting: Internal Medicine

## 2017-09-02 ENCOUNTER — Encounter (INDEPENDENT_AMBULATORY_CARE_PROVIDER_SITE_OTHER): Payer: Self-pay | Admitting: *Deleted

## 2017-09-02 VITALS — BP 130/78 | HR 60 | Temp 98.1°F | Ht 62.0 in | Wt 335.4 lb

## 2017-09-02 DIAGNOSIS — R1013 Epigastric pain: Secondary | ICD-10-CM | POA: Diagnosis not present

## 2017-09-02 MED ORDER — PANTOPRAZOLE SODIUM 40 MG PO TBEC: 40.0000 mg | DELAYED_RELEASE_TABLET | Freq: Every day | ORAL | 3 refills | Status: DC

## 2017-09-02 NOTE — Patient Instructions (Addendum)
Rx for Protonix sent to her pharmacy.  Gastroesophageal Reflux Disease, Adult Normally, food travels down the esophagus and stays in the stomach to be digested. However, when a person has gastroesophageal reflux disease (GERD), food and stomach acid move back up into the esophagus. When this happens, the esophagus becomes sore and inflamed. Over time, GERD can create small holes (ulcers) in the lining of the esophagus. What are the causes? This condition is caused by a problem with the muscle between the esophagus and the stomach (lower esophageal sphincter, or LES). Normally, the LES muscle closes after food passes through the esophagus to the stomach. When the LES is weakened or abnormal, it does not close properly, and that allows food and stomach acid to go back up into the esophagus. The LES can be weakened by certain dietary substances, medicines, and medical conditions, including:  Tobacco use.  Pregnancy.  Having a hiatal hernia.  Heavy alcohol use.  Certain foods and beverages, such as coffee, chocolate, onions, and peppermint.  What increases the risk? This condition is more likely to develop in:  People who have an increased body weight.  People who have connective tissue disorders.  People who use NSAID medicines.  What are the signs or symptoms? Symptoms of this condition include:  Heartburn.  Difficult or painful swallowing.  The feeling of having a lump in the throat.  Abitter taste in the mouth.  Bad breath.  Having a large amount of saliva.  Having an upset or bloated stomach.  Belching.  Chest pain.  Shortness of breath or wheezing.  Ongoing (chronic) cough or a night-time cough.  Wearing away of tooth enamel.  Weight loss.  Different conditions can cause chest pain. Make sure to see your health care provider if you experience chest pain. How is this diagnosed? Your health care provider will take a medical history and perform a physical exam.  To determine if you have mild or severe GERD, your health care provider may also monitor how you respond to treatment. You may also have other tests, including:  An endoscopy toexamine your stomach and esophagus with a small camera.  A test thatmeasures the acidity level in your esophagus.  A test thatmeasures how much pressure is on your esophagus.  A barium swallow or modified barium swallow to show the shape, size, and functioning of your esophagus.  How is this treated? The goal of treatment is to help relieve your symptoms and to prevent complications. Treatment for this condition may vary depending on how severe your symptoms are. Your health care provider may recommend:  Changes to your diet.  Medicine.  Surgery.  Follow these instructions at home: Diet  Follow a diet as recommended by your health care provider. This may involve avoiding foods and drinks such as: ? Coffee and tea (with or without caffeine). ? Drinks that containalcohol. ? Energy drinks and sports drinks. ? Carbonated drinks or sodas. ? Chocolate and cocoa. ? Peppermint and mint flavorings. ? Garlic and onions. ? Horseradish. ? Spicy and acidic foods, including peppers, chili powder, curry powder, vinegar, hot sauces, and barbecue sauce. ? Citrus fruit juices and citrus fruits, such as oranges, lemons, and limes. ? Tomato-based foods, such as red sauce, chili, salsa, and pizza with red sauce. ? Fried and fatty foods, such as donuts, french fries, potato chips, and high-fat dressings. ? High-fat meats, such as hot dogs and fatty cuts of red and white meats, such as rib eye steak, sausage, ham, and bacon. ?  High-fat dairy items, such as whole milk, butter, and cream cheese.  Eat small, frequent meals instead of large meals.  Avoid drinking large amounts of liquid with your meals.  Avoid eating meals during the 2-3 hours before bedtime.  Avoid lying down right after you eat.  Do not exercise right  after you eat. General instructions  Pay attention to any changes in your symptoms.  Take over-the-counter and prescription medicines only as told by your health care provider. Do not take aspirin, ibuprofen, or other NSAIDs unless your health care provider told you to do so.  Do not use any tobacco products, including cigarettes, chewing tobacco, and e-cigarettes. If you need help quitting, ask your health care provider.  Wear loose-fitting clothing. Do not wear anything tight around your waist that causes pressure on your abdomen.  Raise (elevate) the head of your bed 6 inches (15cm).  Try to reduce your stress, such as with yoga or meditation. If you need help reducing stress, ask your health care provider.  If you are overweight, reduce your weight to an amount that is healthy for you. Ask your health care provider for guidance about a safe weight loss goal.  Keep all follow-up visits as told by your health care provider. This is important. Contact a health care provider if:  You have new symptoms.  You have unexplained weight loss.  You have difficulty swallowing, or it hurts to swallow.  You have wheezing or a persistent cough.  Your symptoms do not improve with treatment.  You have a hoarse voice. Get help right away if:  You have pain in your arms, neck, jaw, teeth, or back.  You feel sweaty, dizzy, or light-headed.  You have chest pain or shortness of breath.  You vomit and your vomit looks like blood or coffee grounds.  You faint.  Your stool is bloody or black.  You cannot swallow, drink, or eat. This information is not intended to replace advice given to you by your health care provider. Make sure you discuss any questions you have with your health care provider. Document Released: 12/19/2004 Document Revised: 08/09/2015 Document Reviewed: 07/06/2014 Elsevier Interactive Patient Education  [employer].

## 2017-09-02 NOTE — Progress Notes (Signed)
   Subjective:    Patient ID: Whitney Bauer, female    DOB: 1955/05/28, 62 y.o.   MRN: [id number]  HPI Referred by Dr. Ouida Sills. She tells me she has been having some stomach problems. She has some epigastric pain.  She says she felt cheese was bothering her so she stopped the cheese. She tells me she feels better. Pizza and lasagna bother her.  She says her mother had pancreatic cancer at age 40.  She says she ate a small salad this morning which is bother her some. Her appetite is good. No weight loss. She has BM at least twice a weeks. No melena or BRRB. Today, she is not having any abdominal pain.   Takes Diclofenac 75mg  BID  Review of Systems Past Medical History:  Diagnosis Date  . Arthritis    bilat. knees  . Hypertension     Past Surgical History:  Procedure Laterality Date  . COLONOSCOPY N/A 01/06/2014   Procedure: COLONOSCOPY;  Surgeon: 01/08/2014, MD;  [facility]: [facility];  Service: Endoscopy;  Laterality: N/A;  830  . FOOT SURGERY    . PARTIAL HYSTERECTOMY  jan 1999    No Known Allergies  Current Outpatient Medications on File Prior to Visit  Medication Sig Dispense Refill  . atenolol-chlorthalidone (TENORETIC) 50-25 MG tablet Take 1 tablet by mouth daily.    . cyclobenzaprine (FLEXERIL) 10 MG tablet Take 10 mg by mouth as needed for muscle spasms.     . diclofenac (VOLTAREN) 75 MG EC tablet TAKE 1 TABLET (75 MG TOTAL) BY MOUTH 2 (TWO) TIMES DAILY WITH A MEAL. 60 tablet 5  . HYDROcodone-acetaminophen (NORCO) 7.5-325 MG tablet Take 1 tablet by mouth every 6 (six) hours as needed for moderate pain (Must last 30 days.). 110 tablet 0  . loperamide (IMODIUM) 1 MG/5ML solution Take 6 mg by mouth as needed for diarrhea or loose stools (patient uses at least twice weekly).     No current facility-administered medications on file prior to visit.         Objective:   Physical Exam Blood pressure 130/78, pulse 60, temperature 98.1 F (36.7 C), height 5\' 2"   (1.575 m), weight (!) 335 lb 6.4 oz (152.1 kg). Alert and oriented. Skin warm and dry. Oral mucosa is moist.   . Sclera anicteric, conjunctivae is pink. Thyroid not enlarged. No cervical lymphadenopathy. Lungs clear. Heart regular rate and rhythm.  Abdomen is soft. Bowel sounds are positive. No hepatomegaly. No abdominal masses felt. No tenderness.  No edema to lower extremities.          Assessment & Plan:  Epigastric pain, resolved. Am going to start her on Protonix 40mg  daily. Will see back in 6 months.  07-23-1974 abdomen.

## 2017-09-09 ENCOUNTER — Ambulatory Visit (HOSPITAL_COMMUNITY)
Admission: RE | Admit: 2017-09-09 | Discharge: 2017-09-09 | Disposition: A | Discharge status: Home / Self Care | Payer: No Typology Code available for payment source | Source: Ambulatory Visit | Attending: Internal Medicine | Admitting: Internal Medicine

## 2017-09-09 DIAGNOSIS — R1013 Epigastric pain: Secondary | ICD-10-CM | POA: Diagnosis not present

## 2017-09-18 ENCOUNTER — Telehealth (INDEPENDENT_AMBULATORY_CARE_PROVIDER_SITE_OTHER): Payer: Self-pay | Admitting: Internal Medicine

## 2017-09-18 NOTE — Telephone Encounter (Signed)
Patient returned your call - ph# [phone]

## 2017-09-18 NOTE — Telephone Encounter (Signed)
I will try again to give her her lab results

## 2017-10-08 ENCOUNTER — Encounter: Payer: Self-pay | Admitting: Orthopaedic Surgery

## 2017-10-08 ENCOUNTER — Ambulatory Visit: Payer: No Typology Code available for payment source | Admitting: Orthopaedic Surgery

## 2017-10-08 VITALS — BP 146/83 | HR 60 | Ht 62.0 in | Wt 335.0 lb

## 2017-10-08 DIAGNOSIS — G8929 Other chronic pain: Secondary | ICD-10-CM

## 2017-10-08 DIAGNOSIS — Z6841 Body Mass Index (BMI) 40.0 and over, adult: Secondary | ICD-10-CM

## 2017-10-08 DIAGNOSIS — M25561 Pain in right knee: Secondary | ICD-10-CM | POA: Diagnosis not present

## 2017-10-08 NOTE — Progress Notes (Signed)
CC:  I have pain of my right knee. I would like an injection.  The patient has chronic pain of the right knee.  There is no recent trauma.  There is no redness.  Injections in the past have helped.  The knee has no redness, has an effusion and crepitus present.  ROM of the right knee is 0-100.  Impression:  Chronic knee pain right  Return: 2 months  PROCEDURE NOTE:  The patient requests injections of the right knee , verbal consent was obtained.  The right knee was prepped appropriately after time out was performed.   Sterile technique was observed and injection of 1 cc of Depo-Medrol 40 mg with several cc's of plain xylocaine. Anesthesia was provided by ethyl chloride and a 20-gauge needle was used to inject the knee area. The injection was tolerated well.  A band aid dressing was applied.  The patient was advised to apply ice later today and tomorrow to the injection sight as needed.  The patient meets the AMA guidelines for Morbid (severe) obesity with a BMI > 40.0 and I have recommended weight loss.  Electronically Signed Darreld Mclean, MD 7/17/20199:52 AM

## 2017-12-03 ENCOUNTER — Encounter: Payer: Self-pay | Admitting: Orthopaedic Surgery

## 2017-12-03 ENCOUNTER — Ambulatory Visit: Payer: No Typology Code available for payment source | Admitting: Orthopaedic Surgery

## 2017-12-03 VITALS — BP 131/81 | HR 64 | Ht 62.0 in | Wt 334.0 lb

## 2017-12-03 DIAGNOSIS — M25561 Pain in right knee: Secondary | ICD-10-CM | POA: Diagnosis not present

## 2017-12-03 DIAGNOSIS — G8929 Other chronic pain: Secondary | ICD-10-CM | POA: Diagnosis not present

## 2017-12-03 DIAGNOSIS — Z6841 Body Mass Index (BMI) 40.0 and over, adult: Secondary | ICD-10-CM

## 2017-12-03 NOTE — Progress Notes (Signed)
CC:  I have pain of my right knee. I would like an injection.  The patient has chronic pain of the right knee.  There is no recent trauma.  There is no redness.  Injections in the past have helped.  The knee has no redness, has an effusion and crepitus present.  ROM of the right knee is 0-100.  Impression:  Chronic knee pain right  Return: 2 months   The patient meets the AMA guidelines for Morbid (severe) obesity with a BMI > 40.0 and I have recommended weight loss.  PROCEDURE NOTE:  The patient requests injections of the right knee , verbal consent was obtained.  The right knee was prepped appropriately after time out was performed.   Sterile technique was observed and injection of 1 cc of Depo-Medrol 40 mg with several cc's of plain xylocaine. Anesthesia was provided by ethyl chloride and a 20-gauge needle was used to inject the knee area. The injection was tolerated well.  A band aid dressing was applied.  The patient was advised to apply ice later today and tomorrow to the injection sight as needed.  Encounter Diagnoses  Name Primary?  . Chronic pain of right knee Yes  . Body mass index 60.0-69.9, adult (HCC)   . Morbid obesity [facility])    Electronically Signed Darreld Mclean, MD 9/11/201910:00 AM

## 2017-12-09 ENCOUNTER — Ambulatory Visit: Payer: No Typology Code available for payment source | Admitting: Orthopaedic Surgery

## 2017-12-09 ENCOUNTER — Telehealth: Payer: Self-pay | Admitting: Radiology

## 2017-12-09 NOTE — Telephone Encounter (Signed)
I spoke to patient today to give her the number to call Dr Quillian Quince to get an appointment scheduled for help with weight loss. The number is [phone], and patient states she will call to make the appointment.

## 2018-02-03 ENCOUNTER — Encounter: Payer: Self-pay | Admitting: Orthopaedic Surgery

## 2018-02-03 ENCOUNTER — Encounter (INDEPENDENT_AMBULATORY_CARE_PROVIDER_SITE_OTHER): Payer: Self-pay

## 2018-02-03 ENCOUNTER — Ambulatory Visit: Payer: No Typology Code available for payment source | Admitting: Orthopaedic Surgery

## 2018-02-03 VITALS — BP 155/91 | HR 75 | Ht 62.0 in | Wt 333.0 lb

## 2018-02-03 DIAGNOSIS — M25561 Pain in right knee: Secondary | ICD-10-CM

## 2018-02-03 DIAGNOSIS — G8929 Other chronic pain: Secondary | ICD-10-CM

## 2018-02-03 DIAGNOSIS — Z6841 Body Mass Index (BMI) 40.0 and over, adult: Secondary | ICD-10-CM | POA: Diagnosis not present

## 2018-02-03 MED ORDER — HYDROCODONE-ACETAMINOPHEN 7.5-325 MG PO TABS: 1.0000 | ORAL_TABLET | Freq: Four times a day (QID) | ORAL | 0 refills | Status: DC | PRN

## 2018-02-03 NOTE — Progress Notes (Signed)
Patient Whitney Bauer, female DOB:15-Jun-1955, 62 y.o. WPY:[id number]  Chief Complaint  Patient presents with  . Knee Pain    right     HPI  Whitney Bauer is a 62 y.o. female who had chronic pain of the right knee.  It is more painful when first standing and with cold weather.  She has no new trauma, no giving way.   Body mass index is 60.91 kg/m.  The patient meets the AMA guidelines for Morbid (severe) obesity with a BMI > 40.0 and I have recommended weight loss.   ROS  Review of Systems  All other systems reviewed and are negative.  The following is a summary of the past history medically, past history surgically, known current medicines, social history and family history.  This information is gathered electronically by the computer from prior information and documentation.  I review this each visit and have found including this information at this point in the chart is beneficial and informative.    Past Medical History:  Diagnosis Date  . Arthritis    bilat. knees  . Hypertension     Past Surgical History:  Procedure Laterality Date  . COLONOSCOPY N/A 01/06/2014   Procedure: COLONOSCOPY;  Surgeon: Malissa Hippo, MD;  Location[facility];  Service: Endoscopy;  Laterality: N/A;  830  . FOOT SURGERY    . PARTIAL HYSTERECTOMY  jan 1999    Family History  Problem Relation Age of Onset  . Asthma Mother   . Heart disease Mother   . Pancreatic cancer Mother   . Ovarian cancer Mother   . Asthma Father   . Colon cancer Father   . Prostate cancer Father   . Asthma Sister   . Cancer Paternal Grandmother        mouth    Social History Social History   Tobacco Use  . Smoking status: Never Smoker  . Smokeless tobacco: Never Used  Substance Use Topics  . Alcohol use: No  . Drug use: Not on file    No Known Allergies  Current Outpatient Medications  Medication Sig Dispense Refill  . atenolol-chlorthalidone (TENORETIC) 50-25 MG tablet Take 1 tablet by  mouth daily.    . cyclobenzaprine (FLEXERIL) 10 MG tablet Take 10 mg by mouth as needed for muscle spasms.     . diclofenac (VOLTAREN) 75 MG EC tablet TAKE 1 TABLET (75 MG TOTAL) BY MOUTH 2 (TWO) TIMES DAILY WITH A MEAL. 60 tablet 5  . HYDROcodone-acetaminophen (NORCO) 7.5-325 MG tablet Take 1 tablet by mouth every 6 (six) hours as needed for moderate pain (Must last 30 days.). 110 tablet 0  . loperamide (IMODIUM) 1 MG/5ML solution Take 6 mg by mouth as needed for diarrhea or loose stools (patient uses at least twice weekly).    . pantoprazole (PROTONIX) 40 MG tablet Take 1 tablet (40 mg total) by mouth daily. 90 tablet 3   No current facility-administered medications for this visit.      Physical Exam  Blood pressure (!) 155/91, pulse 75, height 5\' 2"  (1.575 m), weight (!) 333 lb (151 kg).  Constitutional: overall normal hygiene, normal nutrition, well developed, normal grooming, normal body habitus. Assistive device:cane  Musculoskeletal: gait and station Limp right, muscle tone and strength are normal, no tremors or atrophy is present.  .  Neurological: coordination overall normal.  Deep tendon reflex/nerve stretch intact.  Sensation normal.  Cranial nerves II-XII intact.   Skin:   Normal overall no scars,  lesions, ulcers or rashes. No psoriasis.  Psychiatric: Alert and oriented x 3.  Recent memory intact, remote memory unclear.  Normal mood and affect. Well groomed.  Good eye contact.  Cardiovascular: overall no swelling, no varicosities, no edema bilaterally, normal temperatures of the legs and arms, no clubbing, cyanosis and good capillary refill.  Lymphatic: palpation is normal.  The right lower extremity is examined:  Inspection:  Thigh:  Non-tender and no defects  Knee has swelling 1+ effusion.                        Joint tenderness is present                        Patient is tender over the medial joint line  Lower Leg:  Has normal appearance and no tenderness or  defects  Ankle:  Non-tender and no defects  Foot:  Non-tender and no defects Range of Motion:  Knee:  Range of motion is: 0-95                        Crepitus is  present  Ankle:  Range of motion is normal. Strength and Tone:  The right lower extremity has normal strength and tone. Stability:  Knee:  The knee is stable.  Ankle:  The ankle is stable. All other systems reviewed and are negative   The patient has been educated about the nature of the problem(s) and counseled on treatment options.  The patient appeared to understand what I have discussed and is in agreement with it.  Encounter Diagnoses  Name Primary?  . Chronic pain of right knee Yes  . Body mass index 60.0-69.9, adult (HCC)   . Morbid obesity (HCC)     PLAN Call if any problems.  Precautions discussed.  Continue current medications.   Return to clinic 2 months   I have reviewed the [facility] Controlled Substance Reporting System web site prior to prescribing narcotic medicine for this patient.    Electronically Signed Darreld Mclean, MD 11/12/20199:38 AM

## 2018-03-10 ENCOUNTER — Encounter (INDEPENDENT_AMBULATORY_CARE_PROVIDER_SITE_OTHER): Payer: No Typology Code available for payment source

## 2018-04-07 ENCOUNTER — Ambulatory Visit: Payer: No Typology Code available for payment source | Admitting: Orthopaedic Surgery

## 2018-04-07 ENCOUNTER — Encounter: Payer: Self-pay | Admitting: Orthopaedic Surgery

## 2018-04-07 VITALS — BP 116/65 | HR 68 | Ht 62.0 in | Wt 340.0 lb

## 2018-04-07 DIAGNOSIS — Z6841 Body Mass Index (BMI) 40.0 and over, adult: Secondary | ICD-10-CM | POA: Diagnosis not present

## 2018-04-07 DIAGNOSIS — M25561 Pain in right knee: Secondary | ICD-10-CM | POA: Diagnosis not present

## 2018-04-07 DIAGNOSIS — G8929 Other chronic pain: Secondary | ICD-10-CM

## 2018-04-07 NOTE — Progress Notes (Signed)
Patient HQ:PRFF Whitney Bauer, female DOB:08/30/1955, 63 y.o. MBW:[id number]  Chief Complaint  Patient presents with  . Knee Pain    Chronic right knee pain.    HPI  Whitney Bauer is a 63 y.o. female who has chronic pain of the right knee.  She asked about visco supplementation.  I can do this but we will have to get insurance company permission.  Her right knee is swelling, popping but not giving way.  The cold weather makes it hurt more. She has no new trauma. She is using a cane now.   Body mass index is 62.19 kg/m.  The patient meets the AMA guidelines for Morbid (severe) obesity with a BMI > 40.0 and I have recommended weight loss.. She has met with physician about her weight and is starting treatment.  ROS  Review of Systems  HENT: Negative for congestion.   Respiratory: Negative for cough and shortness of breath.   Cardiovascular: Negative for chest pain and leg swelling.  Endocrine: Positive for cold intolerance.  Musculoskeletal: Positive for arthralgias, gait problem and joint swelling.  Allergic/Immunologic: Positive for environmental allergies.  All other systems reviewed and are negative.   All other systems reviewed and are negative.  The following is a summary of the past history medically, past history surgically, known current medicines, social history and family history.  This information is gathered electronically by the computer from prior information and documentation.  I review this each visit and have found including this information at this point in the chart is beneficial and informative.    Past Medical History:  Diagnosis Date  . Arthritis    bilat. knees  . Hypertension     Past Surgical History:  Procedure Laterality Date  . COLONOSCOPY N/A 01/06/2014   Procedure: COLONOSCOPY;  Surgeon: Rogene Houston, MD;  Location[facility];  Service: Endoscopy;  Laterality: N/A;  830  . FOOT SURGERY    . PARTIAL HYSTERECTOMY  jan 1999    Family History   Problem Relation Age of Onset  . Asthma Mother   . Heart disease Mother   . Pancreatic cancer Mother   . Ovarian cancer Mother   . Asthma Father   . Colon cancer Father   . Prostate cancer Father   . Asthma Sister   . Cancer Paternal Grandmother        mouth    Social History Social History   Tobacco Use  . Smoking status: Never Smoker  . Smokeless tobacco: Never Used  Substance Use Topics  . Alcohol use: No  . Drug use: Not on file    No Known Allergies  Current Outpatient Medications  Medication Sig Dispense Refill  . atenolol-chlorthalidone (TENORETIC) 50-25 MG tablet Take 1 tablet by mouth daily.    . cyclobenzaprine (FLEXERIL) 10 MG tablet Take 10 mg by mouth as needed for muscle spasms.     . diclofenac (VOLTAREN) 75 MG EC tablet TAKE 1 TABLET (75 MG TOTAL) BY MOUTH 2 (TWO) TIMES DAILY WITH A MEAL. 60 tablet 5  . HYDROcodone-acetaminophen (NORCO) 7.5-325 MG tablet Take 1 tablet by mouth every 6 (six) hours as needed for moderate pain (Must last 30 days.). 110 tablet 0  . loperamide (IMODIUM) 1 MG/5ML solution Take 6 mg by mouth as needed for diarrhea or loose stools (patient uses at least twice weekly).    . pantoprazole (PROTONIX) 40 MG tablet Take 1 tablet (40 mg total) by mouth daily. 90 tablet 3  No current facility-administered medications for this visit.      Physical Exam  Blood pressure 116/65, pulse 68, height _0  (1.575 m), weight (!) 340 lb (154.2 kg).  Constitutional: overall normal hygiene, normal nutrition, well developed, normal grooming, normal body habitus. Assistive device:cane  Musculoskeletal: gait and station Limp right, muscle tone and strength are normal, no tremors or atrophy is present.  .  Neurological: coordination overall normal.  Deep tendon reflex/nerve stretch intact.  Sensation normal.  Cranial nerves II-XII intact.   Skin:   Normal overall no scars, lesions, ulcers or rashes. No psoriasis.  Psychiatric: Alert and oriented  x 3.  Recent memory intact, remote memory unclear.  Normal mood and affect. Well groomed.  Good eye contact.  Cardiovascular: overall no swelling, no varicosities, no edema bilaterally, normal temperatures of the legs and arms, no clubbing, cyanosis and good capillary refill.  Lymphatic: palpation is normal.  Right knee with crepitus, pain, ROM 0 to 90, effusion, limp right, NV intact.  All other systems reviewed and are negative   The patient has been educated about the nature of the problem(s) and counseled on treatment options.  The patient appeared to understand what I have discussed and is in agreement with it.  Encounter Diagnoses  Name Primary?  . Chronic pain of right knee Yes  . Body mass index 60.0-69.9, adult ([place])   . Morbid obesity ([place])     PLAN Call if any problems.  Precautions discussed.  Continue current medications.   Return to clinic after getting insurance company permission for visco supplementation.   Electronically Signed Sanjuana Kava, MD 1/14/202010:09 AM

## 2018-04-08 ENCOUNTER — Encounter (INDEPENDENT_AMBULATORY_CARE_PROVIDER_SITE_OTHER): Payer: Self-pay | Admitting: Bariatrics

## 2018-04-08 ENCOUNTER — Ambulatory Visit (INDEPENDENT_AMBULATORY_CARE_PROVIDER_SITE_OTHER): Payer: No Typology Code available for payment source | Admitting: Bariatrics

## 2018-04-08 VITALS — BP 125/82 | HR 56 | Temp 98.1°F | Ht 63.0 in | Wt 336.0 lb

## 2018-04-08 DIAGNOSIS — K219 Gastro-esophageal reflux disease without esophagitis: Secondary | ICD-10-CM | POA: Diagnosis not present

## 2018-04-08 DIAGNOSIS — R0602 Shortness of breath: Secondary | ICD-10-CM | POA: Diagnosis not present

## 2018-04-08 DIAGNOSIS — I10 Essential (primary) hypertension: Secondary | ICD-10-CM

## 2018-04-08 DIAGNOSIS — R5383 Other fatigue: Secondary | ICD-10-CM

## 2018-04-08 DIAGNOSIS — Z0289 Encounter for other administrative examinations: Secondary | ICD-10-CM

## 2018-04-08 DIAGNOSIS — Z9189 Other specified personal risk factors, not elsewhere classified: Secondary | ICD-10-CM | POA: Diagnosis not present

## 2018-04-08 DIAGNOSIS — Z1331 Encounter for screening for depression: Secondary | ICD-10-CM

## 2018-04-08 DIAGNOSIS — M25561 Pain in right knee: Secondary | ICD-10-CM

## 2018-04-08 DIAGNOSIS — G8929 Other chronic pain: Secondary | ICD-10-CM

## 2018-04-08 DIAGNOSIS — Z6841 Body Mass Index (BMI) 40.0 and over, adult: Secondary | ICD-10-CM

## 2018-04-09 ENCOUNTER — Other Ambulatory Visit (HOSPITAL_COMMUNITY): Payer: Self-pay | Admitting: Internal Medicine

## 2018-04-09 DIAGNOSIS — Z1231 Encounter for screening mammogram for malignant neoplasm of breast: Secondary | ICD-10-CM

## 2018-04-09 LAB — HEMOGLOBIN A1C
ESTIMATED AVERAGE GLUCOSE: 108 mg/dL
HEMOGLOBIN A1C: 5.4 % (ref 4.8–5.6)

## 2018-04-09 LAB — VITAMIN D 25 HYDROXY (VIT D DEFICIENCY, FRACTURES): Vit D, 25-Hydroxy: 6.1 ng/mL — ABNORMAL LOW (ref 30.0–100.0)

## 2018-04-09 LAB — INSULIN, RANDOM: INSULIN: 4.4 u[IU]/mL (ref 2.6–24.9)

## 2018-04-09 NOTE — Progress Notes (Signed)
Office: [phone]  /  Fax: [phone]   Dear Dr. Hilda Bauer,   Thank you for referring Whitney BannisterMyra C Bauer to our clinic. The following note includes my evaluation and treatment recommendations.  HPI:   Chief Complaint: OBESITY    Whitney Bauer has been referred by Darreld McleanWayne Keeling, MD for consultation regarding her obesity and obesity related comorbidities.    Whitney Bauer (MR# [id number]) is a 63 y.o. female who presents on 04/09/2018 for obesity evaluation and treatment. Current BMI is Body mass index is 59.52 kg/m.Marland Kitchen. Whitney Bauer has been struggling with her weight for many years and has been unsuccessful in either losing weight, maintaining weight loss, or reaching her healthy weight goal. Whitney Bauer needs knee replacement surgery.     Whitney Bauer attended our information session and states she is currently in the action stage of change and ready to dedicate time achieving and maintaining a healthier weight. Whitney Bauer is interested in becoming our Whitney Bauer and working on intensive lifestyle modifications including (but not limited to) diet, exercise and weight loss.    Whitney Bauer states her desired weight loss is 176 lbs she has been heavy most of  her life she started gaining weight when she started working her heaviest weight ever was 377 lbs. she expresses some food dislikes She considers herself a "picky eater" she has significant food cravings issues  she snacks frequently in the evenings she skips meals frequently she is frequently drinking liquids with calories she frequently makes poor food choices she has binge eating behaviors she struggles with emotional eating  Whitney Bauer works night shift   Fatigue Whitney Bauer feels her energy is lower than it should be. This has worsened with weight gain and has not worsened recently. Whitney Bauer to daytime somnolence and she Bauer to waking up still tired. Whitney Bauer is at risk for obstructive sleep apnea. Patent has a history of symptoms of daytime fatigue, morning fatigue and  hypertension. Whitney Bauer generally gets 4 or 5 hours of sleep per night, and states they generally have restless sleep. Snoring is not present. Apneic episodes are not present. Epworth Sleepiness Score is 5  Dyspnea on exertion Whitney Bauer notes increasing shortness of breath with certain activities and seems to be worsening over time with weight gain. She notes getting out of breath sooner with activity than she used to. This has not gotten worse recently. Whitney Bauer denies orthopnea.  Hypertension Whitney Bauer is a 63 y.o. female with hypertension. She is taking Chlorthalidone and Atenolol. Whitney Bauer denies chest pain or shortness of breath on exertion. She is working weight loss to help control her blood pressure with the goal of decreasing her risk of heart attack and stroke. [name] blood pressure is  Reasonably well controlled.  At risk for cardiovascular disease Whitney Bauer is at a higher than average risk for cardiovascular disease due to obesity and hypertension. She currently denies any chest pain.  Chronic Right Knee Pain Whitney Bauer has a history of derangement of her right knee, which limits her exercise. She has had cortisone injection in the past.  GERD (gastroesophageal reflux disease) Whitney Bauer has a diagnosis of GERD and she is currently taking Pantoprazole. She has some "bouts" with diarrhea.  Depression Screen Whitney Bauer Food and Mood (modified PHQ-9) score was  Depression screen PHQ 2/9 04/08/2018  Decreased Interest 3  Down, Depressed, Hopeless 1  PHQ - 2 Score 4  Altered sleeping 0  Tired, decreased energy 3  Change in appetite 1  Feeling bad or failure about yourself  0  Trouble concentrating 0  Moving slowly or fidgety/restless 3  Suicidal thoughts 0  PHQ-9 Score 11  Difficult doing work/chores Somewhat difficult    ASSESSMENT AND PLAN:  Other fatigue - Plan: EKG 12-Lead, Hemoglobin A1c, Insulin, random, VITAMIN D 25 Hydroxy (Vit-D Deficiency, Fractures)  Chronic pain of right  knee  Shortness of breath on exertion  Gastroesophageal reflux disease, esophagitis presence not specified  Essential hypertension  At risk for heart disease  Screening for depression  Class 3 severe obesity with serious comorbidity and body mass index (BMI) of 50.0 to 59.9 in adult, unspecified obesity type (HCC)  PLAN:  Fatigue Whitney Bauer was informed that her fatigue may be related to obesity, depression or many other causes. Labs will be ordered, and in the meanwhile Whitney Bauer to work on diet, exercise and weight loss to help with fatigue. Proper sleep hygiene was discussed including the need for 7-8 hours of quality sleep each night. A sleep study was not ordered based on symptoms and Epworth score.  Dyspnea on exertion Whitney Bauer's shortness of breath appears to be obesity related and exercise induced. She has [facility] to work on weight loss and gradually increase exercise to treat her exercise induced shortness of breath. If Whitney Bauer follows our instructions and loses weight without improvement of her shortness of breath, we will plan to refer to pulmonology. We will monitor this condition regularly. Whitney Bauer agrees to this plan.  Hypertension We discussed sodium restriction, working on healthy weight loss, and a regular exercise program as the means to achieve improved blood pressure control. Whitney Bauer Bauer with this plan and Bauer to follow up as directed. We will continue to monitor her blood pressure as well as her progress with the above lifestyle modifications. She will continue her medications as prescribed and will watch for signs of hypotension as she continues her lifestyle modifications.  Cardiovascular risk counseling Whitney Bauer was given extended (15 minutes) coronary artery disease prevention counseling today. She is 63 y.o. female and has risk factors for heart disease including obesity and hypertension. We discussed intensive lifestyle modifications today with an emphasis on specific weight  loss instructions and strategies. Pt was also informed of the importance of increasing exercise and decreasing saturated fats to help prevent heart disease.  Chronic Right Knee Pain Whitney Bauer will follow up with her PCP and Orthopedist. She will follow up with our clinic in 2 weeks.  GERD (gastroesophageal reflux disease) Whitney Bauer will continue to take the proton pump inhibitor and will follow up with our clinic at the [facility] upon time.  Depression Screen Hani had a moderately positive depression screening. Depression is commonly associated with obesity and often results in emotional eating behaviors. We will monitor this closely and work on CBT to help improve the non-hunger eating patterns. Referral to Psychology may be required if no improvement is seen as she continues in our clinic.  Obesity Whitney Bauer is currently in the action stage of change and her goal is to continue with weight loss efforts. I recommend Giavana begin the structured treatment plan as follows:  She has [facility] to follow the Category 3 plan modified for no yogurt and 5 ounces of meat at lunch Brittie has been instructed to eventually work up to a goal of 150 minutes of combined cardio and strengthening exercise per week for weight loss and overall health benefits. We discussed the following Behavioral Modification Strategies today: increase H2O intake, no skipping meals, keeping healthy foods in the home, better snacking choices, increasing  lean protein intake, decreasing simple carbohydrates, increasing vegetables, work on meal planning and easy cooking plans and decrease or eliminate sugary drinks   She was informed of the importance of frequent follow up visits to maximize her success with intensive lifestyle modifications for her multiple health conditions. She was informed we would discuss her lab results at her next visit unless there is a critical issue that needs to be addressed sooner. Zaydah Bauer to keep her next visit at the [facility]  upon time to discuss these results.  ALLERGIES: No Known Allergies  MEDICATIONS: Current Outpatient Medications on File Prior to Visit  Medication Sig Dispense Refill  . atenolol-chlorthalidone (TENORETIC) 50-25 MG tablet Take 1 tablet by mouth daily.    . cyclobenzaprine (FLEXERIL) 10 MG tablet Take 10 mg by mouth as needed for muscle spasms.     . diclofenac (VOLTAREN) 75 MG EC tablet TAKE 1 TABLET (75 MG TOTAL) BY MOUTH 2 (TWO) TIMES DAILY WITH A MEAL. 60 tablet 5  . HYDROcodone-acetaminophen (NORCO) 7.5-325 MG tablet Take 1 tablet by mouth every 6 (six) hours as needed for moderate pain (Must last 30 days.). 110 tablet 0  . loperamide (IMODIUM) 1 MG/5ML solution Take 6 mg by mouth as needed for diarrhea or loose stools (Whitney Bauer uses at least twice weekly).    . pantoprazole (PROTONIX) 40 MG tablet Take 1 tablet (40 mg total) by mouth daily. 90 tablet 3   No current facility-administered medications on file prior to visit.     PAST MEDICAL HISTORY: Past Medical History:  Diagnosis Date  . Anemia   . Arthritis    bilat. knees  . Back pain   . Constipation   . Hypertension   . Joint pain   . Lactose intolerance   . Rheumatoid arthritis (HCC)   . Swelling of both lower extremities     PAST SURGICAL HISTORY: Past Surgical History:  Procedure Laterality Date  . COLONOSCOPY N/A 01/06/2014   Procedure: COLONOSCOPY;  Surgeon: Malissa Hippo, MD;  Location: [facility];  Service: Endoscopy;  Laterality: N/A;  830  . FOOT SURGERY    . PARTIAL HYSTERECTOMY  jan 1999    SOCIAL HISTORY: Social History   Tobacco Use  . Smoking status: Never Smoker  . Smokeless tobacco: Never Used  Substance Use Topics  . Alcohol use: No  . Drug use: Not on file    FAMILY HISTORY: Family History  Problem Relation Age of Onset  . Asthma Mother   . Heart disease Mother   . Pancreatic cancer Mother   . Ovarian cancer Mother   . Diabetes Mother   . Stroke Mother   . Asthma Father   .  Colon cancer Father   . Prostate cancer Father   . Stroke Father   . High blood pressure Father   . Asthma Sister   . Cancer Paternal Grandmother        mouth    ROS: Review of Systems  Constitutional: Positive for malaise/fatigue.  Eyes:       + Wear Glasses or Contacts  Respiratory: Positive for shortness of breath (on exertion).   Cardiovascular: Negative for chest pain and orthopnea.  Gastrointestinal: Positive for diarrhea.  Musculoskeletal: Positive for back pain.       + Muscle or Joint Pain    PHYSICAL EXAM: Blood pressure 125/82, pulse (!) 56, temperature 98.1 F (36.7 C), temperature source Oral, height 5\' 3"  (1.6 m), weight (!) 336 lb (152.4 kg),  SpO2 100 %. Body mass index is 59.52 kg/m. Physical Exam Vitals signs reviewed.  Constitutional:      Appearance: Normal appearance. She is well-developed. She is obese.  HENT:     Head: Normocephalic and atraumatic.     Nose: Nose normal.     Mouth/Throat:     Comments: Mallampati = 3 Eyes:     General: No scleral icterus.    Extraocular Movements: Extraocular movements intact.  Neck:     Musculoskeletal: Normal range of motion and neck supple.     Thyroid: No thyromegaly.  Cardiovascular:     Rate and Rhythm: Normal rate and regular rhythm.  Pulmonary:     Effort: Pulmonary effort is normal. No respiratory distress.  Abdominal:     Palpations: Abdomen is soft.     Tenderness: There is no abdominal tenderness.  Musculoskeletal: Normal range of motion.     Right lower leg: Edema (non pitting) present.     Left lower leg: Edema (non pitting) present.     Comments: Range of Motion normal in all 4 extremities  Skin:    General: Skin is warm and dry.  Neurological:     Mental Status: She is alert and oriented to person, place, and time.     Coordination: Coordination normal.     Gait: Gait abnormal (antalgic gait, using a cane).  Psychiatric:        Mood and Affect: Mood normal.        Behavior: Behavior  normal.     RECENT LABS AND TESTS: BMET    Component Value Date/Time   NA 140 04/15/2011 1745   K 3.4 (L) 04/15/2011 1745   CL 105 04/15/2011 1745   CO2 27 04/15/2011 1745   GLUCOSE 90 04/15/2011 1745   BUN 13 04/15/2011 1745   CREATININE 0.51 04/15/2011 1745   CALCIUM 9.8 04/15/2011 1745   GFRNONAA >90 04/15/2011 1745   GFRAA >90 04/15/2011 1745   Lab Results  Component Value Date   HGBA1C 5.4 04/08/2018   Lab Results  Component Value Date   INSULIN 4.4 04/08/2018   CBC    Component Value Date/Time   WBC 5.6 04/15/2011 1745   RBC 4.44 04/15/2011 1745   HGB 12.2 04/15/2011 1745   HCT 37.3 04/15/2011 1745   PLT 202 04/15/2011 1745   MCV 84.0 04/15/2011 1745   MCH 27.5 04/15/2011 1745   MCHC 32.7 04/15/2011 1745   RDW 14.0 04/15/2011 1745   LYMPHSABS 1.9 04/15/2011 1745   MONOABS 0.3 04/15/2011 1745   EOSABS 0.2 04/15/2011 1745   BASOSABS 0.0 04/15/2011 1745   Iron/TIBC/Ferritin/ %Sat No results found for: IRON, TIBC, FERRITIN, IRONPCTSAT Lipid Panel  No results found for: CHOL, TRIG, HDL, CHOLHDL, VLDL, LDLCALC, LDLDIRECT Hepatic Function Panel     Component Value Date/Time   PROT 7.3 04/15/2011 1745   ALBUMIN 4.1 04/15/2011 1745   AST 13 04/15/2011 1745   ALT 16 04/15/2011 1745   ALKPHOS 67 04/15/2011 1745   BILITOT 0.5 04/15/2011 1745   No results found for: TSH  ECG  shows NSR with a rate of 60 BPM INDIRECT CALORIMETER done today shows a VO2 of 263 and a REE of 1835.  Her calculated basal metabolic rate is 4098 thus her basal metabolic rate is better than expected.       OBESITY BEHAVIORAL INTERVENTION VISIT  Today's visit was # 1   Starting weight: 336 lbs Starting date: 04/08/2018 Today's weight : 336 lbs  Today's date: 04/08/2018 Total lbs lost to date: 0   ASK: We discussed the diagnosis of obesity with Damonica C Birchall today and Shavonte Bauer to give us permission to discuss obesity behavioral modification therapy today.  ASSESS: Keeshia  has the diagnosis of obesity and her BMI today is 59.53 Jerlisa is in the action stage of change   ADVISE: Takisha was educated on the multiple health risks of obesity as well as the benefit of weight loss to improve her health. She was advised of the need for long term treatment and the importance of lifestyle modifications to improve her current health and to decrease her risk of future health problems.  AGREE: Multiple dietary modification options and treatment options were discussed and  Noa Bauer to follow the recommendations documented in the above note.  ARRANGE: Oliviah was educated on the importance of frequent visits to treat obesity as outlined per CMS and USPSTF guidelines and [facility] to schedule her next follow up appointment today.  Cristi LoronI, Joanne Murray, am acting as Energy managertranscriptionist for [employer]el A. Manson PasseyBrown, DO  I have reviewed the above documentation for accuracy and completeness, and I agree with the above. -Corinna CapraAngel Nelissa Bolduc, DO

## 2018-04-12 ENCOUNTER — Other Ambulatory Visit: Payer: Self-pay | Admitting: Orthopaedic Surgery

## 2018-04-13 DIAGNOSIS — Z6841 Body Mass Index (BMI) 40.0 and over, adult: Secondary | ICD-10-CM

## 2018-04-13 DIAGNOSIS — I10 Essential (primary) hypertension: Secondary | ICD-10-CM | POA: Insufficient documentation

## 2018-04-13 DIAGNOSIS — K219 Gastro-esophageal reflux disease without esophagitis: Secondary | ICD-10-CM | POA: Insufficient documentation

## 2018-04-17 ENCOUNTER — Telehealth: Payer: Self-pay

## 2018-04-17 NOTE — Telephone Encounter (Signed)
Request submitted for the knee injection Synvisc 1, pt insurance requires pt to meet her deductible before they will pay 90%. Called pt and informed her of this information and that after deductible is met she will still be required to pay th 10% out of pocket. Pt states she will call her insurance to find out what steps she needs to meet and will call the office back to let us know.

## 2018-04-22 ENCOUNTER — Ambulatory Visit (INDEPENDENT_AMBULATORY_CARE_PROVIDER_SITE_OTHER): Payer: No Typology Code available for payment source | Admitting: Bariatrics

## 2018-04-22 ENCOUNTER — Encounter (INDEPENDENT_AMBULATORY_CARE_PROVIDER_SITE_OTHER): Payer: Self-pay | Admitting: Bariatrics

## 2018-04-22 VITALS — BP 125/80 | HR 63 | Temp 98.2°F | Ht 63.0 in | Wt 325.0 lb

## 2018-04-22 DIAGNOSIS — E559 Vitamin D deficiency, unspecified: Secondary | ICD-10-CM | POA: Diagnosis not present

## 2018-04-22 DIAGNOSIS — I10 Essential (primary) hypertension: Secondary | ICD-10-CM

## 2018-04-22 DIAGNOSIS — Z6841 Body Mass Index (BMI) 40.0 and over, adult: Secondary | ICD-10-CM

## 2018-04-22 DIAGNOSIS — Z9189 Other specified personal risk factors, not elsewhere classified: Secondary | ICD-10-CM | POA: Diagnosis not present

## 2018-04-22 MED ORDER — VITAMIN D (ERGOCALCIFEROL) 1.25 MG (50000 UNIT) PO CAPS: 50000.0000 [IU] | ORAL_CAPSULE | ORAL | 0 refills | Status: DC

## 2018-04-23 ENCOUNTER — Telehealth: Payer: Self-pay

## 2018-04-23 NOTE — Progress Notes (Signed)
Office: [phone]  /  Fax: [phone]   HPI:   Chief Complaint: OBESITY Whitney Bauer is here to discuss her progress with her obesity treatment plan. She is on the Category 3 plan modified for no yogurt and 5 ounces of meat at lunch and is following her eating plan approximately 90 % of the time. She states she is exercising 0 minutes 0 times per week. Whitney Bauer states that she was full most of the time. It was hard for her to be without certain foods; pasta and rice. She had cravings for rice only. Her weight is (!) 325 lb (147.4 kg) today and has had a weight loss of 11 pounds over a period of 2 weeks since her last visit. She has lost 11 lbs since starting treatment with us.  Vitamin D deficiency Whitney Bauer has a diagnosis of vitamin D deficiency. Her last vitamin D level was at 6.1 She is not currently taking vit D and denies nausea, vomiting or muscle weakness.  At risk for osteopenia and osteoporosis Whitney Bauer is at higher risk of osteopenia and osteoporosis due to vitamin D deficiency.   Hypertension Whitney Bauer is a 63 y.o. female with hypertension. She is taking Atenolol and Chlorthalidone. Whitney Bauer denies headaches. She is working weight loss to help control her blood pressure with the goal of decreasing her risk of heart attack and stroke. Whitney Bauer blood pressure is currently controlled.   ASSESSMENT AND PLAN:  Vitamin D deficiency - Plan: Vitamin D, Ergocalciferol, (DRISDOL) 1.25 MG (50000 UT) CAPS capsule  Essential hypertension  At risk for osteoporosis  Class 3 severe obesity with serious comorbidity and body mass index (BMI) of 50.0 to 59.9 in adult, unspecified obesity type (HCC)  PLAN:  Vitamin D Deficiency Whitney Bauer was informed that low vitamin D levels contributes to fatigue and are associated with obesity, breast, and colon cancer. She agrees to start prescription Vit D @50 ,000 IU every week #4 with no refills and will follow up for routine testing of vitamin D, at least 2-3  times per year. She was informed of the risk of over-replacement of vitamin D and agrees to not increase her dose unless she discusses this with us first. Whitney Bauer agrees to follow up as directed.  At risk for osteopenia and osteoporosis Whitney Bauer was given extended  (15 minutes) osteoporosis prevention counseling today. Whitney Bauer is at risk for osteopenia and osteoporosis due to her vitamin D deficiency. She was encouraged to take her vitamin D and follow her higher calcium diet and increase strengthening exercise to help strengthen her bones and decrease her risk of osteopenia and osteoporosis.  Hypertension We discussed sodium restriction, working on healthy weight loss, and a regular exercise program as the means to achieve improved blood pressure control. Whitney Bauer agreed with this plan and agreed to follow up as directed. We will continue to monitor her blood pressure as well as her progress with the above lifestyle modifications. She will continue her medications as prescribed and will watch for signs of hypotension as she continues her lifestyle modifications.  Obesity Whitney Bauer is currently in the action stage of change. As such, her goal is to continue with weight loss efforts She has agreed to follow the Category 3 plan with 5 ounces of meat at lunch and no yogurt She will substitute other sources of protein for yogurt Whitney Bauer has been instructed to work up to a goal of 150 minutes of combined cardio and strengthening exercise per week for weight loss and overall  health benefits. We discussed the following Behavioral Modification Strategies today: increase H2O intake, keeping healthy foods in the home, better snacking choices, increasing lean protein intake, decreasing simple carbohydrates, increasing vegetables and work on meal planning and easy cooking plans  Whitney Bauer has agreed to follow up with our clinic in 2 weeks. She was informed of the importance of frequent follow up visits to maximize her success with  intensive lifestyle modifications for her multiple health conditions.  ALLERGIES: No Known Allergies  MEDICATIONS: Current Outpatient Medications on File Prior to Visit  Medication Sig Dispense Refill  . atenolol-chlorthalidone (TENORETIC) 50-25 MG tablet Take 1 tablet by mouth daily.    . cyclobenzaprine (FLEXERIL) 10 MG tablet Take 10 mg by mouth as needed for muscle spasms.     . diclofenac (VOLTAREN) 75 MG EC tablet TAKE 1 TABLET (75 MG TOTAL) BY MOUTH 2 (TWO) TIMES DAILY WITH A MEAL. 60 tablet 5  . HYDROcodone-acetaminophen (NORCO) 7.5-325 MG tablet Take 1 tablet by mouth every 6 (six) hours as needed for moderate pain (Must last 30 days.). 110 tablet 0  . loperamide (IMODIUM) 1 MG/5ML solution Take 6 mg by mouth as needed for diarrhea or loose stools (patient uses at least twice weekly).    . pantoprazole (PROTONIX) 40 MG tablet Take 1 tablet (40 mg total) by mouth daily. 90 tablet 3   No current facility-administered medications on file prior to visit.     PAST MEDICAL HISTORY: Past Medical History:  Diagnosis Date  . Anemia   . Arthritis    bilat. knees  . Back pain   . Constipation   . Hypertension   . Joint pain   . Lactose intolerance   . Rheumatoid arthritis (HCC)   . Swelling of both lower extremities     PAST SURGICAL HISTORY: Past Surgical History:  Procedure Laterality Date  . COLONOSCOPY N/A 01/06/2014   Procedure: COLONOSCOPY;  Surgeon: Malissa Hippo, MD;  Location: [facility];  Service: Endoscopy;  Laterality: N/A;  830  . FOOT SURGERY    . PARTIAL HYSTERECTOMY  jan 1999    SOCIAL HISTORY: Social History   Tobacco Use  . Smoking status: Never Smoker  . Smokeless tobacco: Never Used  Substance Use Topics  . Alcohol use: No  . Drug use: Not on file    FAMILY HISTORY: Family History  Problem Relation Age of Onset  . Asthma Mother   . Heart disease Mother   . Pancreatic cancer Mother   . Ovarian cancer Mother   . Diabetes Mother   .  Stroke Mother   . Asthma Father   . Colon cancer Father   . Prostate cancer Father   . Stroke Father   . High blood pressure Father   . Asthma Sister   . Cancer Paternal Grandmother        mouth    ROS: Review of Systems  Constitutional: Positive for weight loss.  Gastrointestinal: Negative for nausea and vomiting.  Musculoskeletal:       Negative for muscle weakness  Neurological: Negative for headaches.    PHYSICAL EXAM: Blood pressure 125/80, pulse 63, temperature 98.2 F (36.8 Bauer), temperature source Oral, height  (1.6 m), weight (!) 325 lb (147.4 kg), SpO2 100 %. Body mass index is 57.57 kg/m. Physical Exam Vitals signs reviewed.  Constitutional:      Appearance: Normal appearance. She is well-developed. She is obese.  Cardiovascular:     Rate and Rhythm: Normal rate.  Pulmonary:     Effort: Pulmonary effort is normal.  Musculoskeletal: Normal range of motion.  Skin:    General: Skin is warm and dry.  Neurological:     Mental Status: She is alert and oriented to person, place, and time.  Psychiatric:        Mood and Affect: Mood normal.        Behavior: Behavior normal.     RECENT LABS AND TESTS: BMET    Component Value Date/Time   NA 140 04/15/2011 1745   K 3.4 (L) 04/15/2011 1745   CL 105 04/15/2011 1745   CO2 27 04/15/2011 1745   GLUCOSE 90 04/15/2011 1745   BUN 13 04/15/2011 1745   CREATININE 0.51 04/15/2011 1745   CALCIUM 9.8 04/15/2011 1745   GFRNONAA >90 04/15/2011 1745   GFRAA >90 04/15/2011 1745   Lab Results  Component Value Date   HGBA1C 5.4 04/08/2018   Lab Results  Component Value Date   INSULIN 4.4 04/08/2018   CBC    Component Value Date/Time   WBC 5.6 04/15/2011 1745   RBC 4.44 04/15/2011 1745   HGB 12.2 04/15/2011 1745   HCT 37.3 04/15/2011 1745   PLT 202 04/15/2011 1745   MCV 84.0 04/15/2011 1745   MCH 27.5 04/15/2011 1745   MCHC 32.7 04/15/2011 1745   RDW 14.0 04/15/2011 1745   LYMPHSABS 1.9 04/15/2011 1745    MONOABS 0.3 04/15/2011 1745   EOSABS 0.2 04/15/2011 1745   BASOSABS 0.0 04/15/2011 1745   Iron/TIBC/Ferritin/ %Sat No results found for: IRON, TIBC, FERRITIN, IRONPCTSAT Lipid Panel  No results found for: CHOL, TRIG, HDL, CHOLHDL, VLDL, LDLCALC, LDLDIRECT Hepatic Function Panel     Component Value Date/Time   PROT 7.3 04/15/2011 1745   ALBUMIN 4.1 04/15/2011 1745   AST 13 04/15/2011 1745   ALT 16 04/15/2011 1745   ALKPHOS 67 04/15/2011 1745   BILITOT 0.5 04/15/2011 1745   No results found for: TSH   Ref. Range 04/08/2018 10:44  Vitamin D, 25-Hydroxy Latest Ref Range: 30.0 - 100.0 ng/mL 6.1 (L)     OBESITY BEHAVIORAL INTERVENTION VISIT  Today's visit was # 2   Starting weight: 336 lbs Starting date: 04/08/2018 Today's weight : 325 lbs Today's date: 04/22/2018 Total lbs lost to date: 51   ASK: We discussed the diagnosis of obesity with Whitney Bauer today and Whitney Bauer agreed to give [place] permission to discuss obesity behavioral modification therapy today.  ASSESS: Whitney Bauer has the diagnosis of obesity and her BMI today is 57.59 Whitney Bauer is in the action stage of change   ADVISE: Whitney Bauer was educated on the multiple health risks of obesity as well as the benefit of weight loss to improve her health. She was advised of the need for long term treatment and the importance of lifestyle modifications to improve her current health and to decrease her risk of future health problems.  AGREE: Multiple dietary modification options and treatment options were discussed and  Modesty agreed to follow the recommendations documented in the above note.  ARRANGE: Raymond was educated on the importance of frequent visits to treat obesity as outlined per CMS and USPSTF guidelines and agreed to schedule her next follow up appointment today.  Cristi Loron, am acting as Energy manager for [employer]. Manson Passey, DO  I have reviewed the above documentation for accuracy and completeness, and I agree with the above.  -Corinna Capra, DO

## 2018-04-23 NOTE — Telephone Encounter (Signed)
Spoke with pt about needing to meet deductible before insurance will pay 80% of this injection, which will leave her with the 20% remaining balance. Pt would like to go ahead and have injections ordered at this time.

## 2018-04-27 ENCOUNTER — Encounter (INDEPENDENT_AMBULATORY_CARE_PROVIDER_SITE_OTHER): Payer: Self-pay | Admitting: Bariatrics

## 2018-04-27 ENCOUNTER — Other Ambulatory Visit (INDEPENDENT_AMBULATORY_CARE_PROVIDER_SITE_OTHER): Payer: Self-pay | Admitting: Bariatrics

## 2018-04-27 DIAGNOSIS — E559 Vitamin D deficiency, unspecified: Secondary | ICD-10-CM

## 2018-04-27 MED ORDER — VITAMIN D (ERGOCALCIFEROL) 1.25 MG (50000 UNIT) PO CAPS: 50000.0000 [IU] | ORAL_CAPSULE | ORAL | 0 refills | Status: DC

## 2018-04-29 ENCOUNTER — Telehealth: Payer: Self-pay | Admitting: Orthopaedic Surgery

## 2018-04-29 NOTE — Telephone Encounter (Signed)
Call received via voice message from Rosalita ChessmanSuzanne at [employer] [facility] - ph# [phone], extension [phone]; states following up on prior authorization for Synvisc for patient's right knee. States hadn't been able to follow up with [facility]; please call or place a note in their portal.

## 2018-05-05 ENCOUNTER — Encounter (INDEPENDENT_AMBULATORY_CARE_PROVIDER_SITE_OTHER): Payer: Self-pay

## 2018-05-05 ENCOUNTER — Ambulatory Visit (INDEPENDENT_AMBULATORY_CARE_PROVIDER_SITE_OTHER): Payer: No Typology Code available for payment source | Admitting: Family Medicine

## 2018-05-06 ENCOUNTER — Ambulatory Visit (INDEPENDENT_AMBULATORY_CARE_PROVIDER_SITE_OTHER): Payer: No Typology Code available for payment source | Admitting: Family Medicine

## 2018-05-06 ENCOUNTER — Encounter (INDEPENDENT_AMBULATORY_CARE_PROVIDER_SITE_OTHER): Payer: Self-pay | Admitting: Family Medicine

## 2018-05-06 VITALS — BP 139/78 | HR 58 | Temp 98.0°F | Ht 63.0 in | Wt 326.0 lb

## 2018-05-06 DIAGNOSIS — Z6841 Body Mass Index (BMI) 40.0 and over, adult: Secondary | ICD-10-CM | POA: Diagnosis not present

## 2018-05-06 DIAGNOSIS — E559 Vitamin D deficiency, unspecified: Secondary | ICD-10-CM | POA: Diagnosis not present

## 2018-05-06 NOTE — Progress Notes (Signed)
Office: [phone]  /  Fax: [phone]   HPI:   Chief Complaint: OBESITY Whitney Bauer is here to discuss her progress with her obesity treatment plan. She is on the Category 3 plan with 5 ounces of meat at lunch and no yogurt She will substitute other sources of protein for yogurt and is following her eating plan approximately 100 % of the time. She states she is exercising 0 minutes 0 times per week. [name] was off plan on Super Bowl Sunday. She denies polyphagia. [name] is eating all food on her plan. She plans to eat at [employer] on Valentines day. Her weight is (!) 326 lb (147.9 kg) today and has gained 1 lbs since her last visit. She has lost 10 lbs since starting treatment with [place].  Vitamin D deficiency Whitney Bauer has a diagnosis of vitamin D deficiency. She is currently taking prescription Vit D and denies nausea, vomiting or muscle weakness. Her last Vit D was 6.1 on 04/08/2018. She is not at goal. Dlisa reports fatigue.  ASSESSMENT AND PLAN:  Vitamin D deficiency  Class 3 severe obesity with serious comorbidity and body mass index (BMI) of 50.0 to 59.9 in adult, unspecified obesity type (HCC)  PLAN:  Vitamin D Deficiency [name] was informed that low vitamin D levels contributes to fatigue and are associated with obesity, breast, and colon cancer. She agrees to continue to take prescription Vit D 50,000 IU every week and will follow up for routine testing of vitamin D, at least 2-3 times per year. She was informed of the risk of over-replacement of vitamin D and agrees to not increase her dose unless she discusses this with [place] first. Eupha agrees to follow up with our clinic in 2 weeks.  I spent > than 50% of the 15 minute visit on counseling as documented in the note.  Obesity Saba is currently in the action stage of change. As such, her goal is to continue with weight loss efforts She has agreed to follow the Category 3 plan [name] has not been prescribed exercise. We discussed the  following Behavioral Modification Strategies today: increasing lean protein intake, decreasing simple carbohydrates, celebration eating strategies and planning for success. We discussed foods she can substitute for 2 oz of meat that are rich in protein.   [name] has agreed to follow up with our clinic in 2 weeks. She was informed of the importance of frequent follow up visits to maximize her success with intensive lifestyle modifications for her multiple health conditions.  ALLERGIES: No Known Allergies  MEDICATIONS: Current Outpatient Medications on File Prior to Visit  Medication Sig Dispense Refill  . atenolol-chlorthalidone (TENORETIC) 50-25 MG tablet Take 1 tablet by mouth daily.    . cyclobenzaprine (FLEXERIL) 10 MG tablet Take 10 mg by mouth as needed for muscle spasms.     . diclofenac (VOLTAREN) 75 MG EC tablet TAKE 1 TABLET (75 MG TOTAL) BY MOUTH 2 (TWO) TIMES DAILY WITH A MEAL. 60 tablet 5  . HYDROcodone-acetaminophen (NORCO) 7.5-325 MG tablet Take 1 tablet by mouth every 6 (six) hours as needed for moderate pain (Must last 30 days.). 110 tablet 0  . loperamide (IMODIUM) 1 MG/5ML solution Take 6 mg by mouth as needed for diarrhea or loose stools (patient uses at least twice weekly).    . pantoprazole (PROTONIX) 40 MG tablet Take 1 tablet (40 mg total) by mouth daily. 90 tablet 3  . Vitamin D, Ergocalciferol, (DRISDOL) 1.25 MG (50000 UT) CAPS capsule Take 1 capsule (  50,000 Units total) by mouth every 7 (seven) days. 4 capsule 0   No current facility-administered medications on file prior to visit.     PAST MEDICAL HISTORY: Past Medical History:  Diagnosis Date  . Anemia   . Arthritis    bilat. knees  . Back pain   . Constipation   . Hypertension   . Joint pain   . Lactose intolerance   . Rheumatoid arthritis (HCC)   . Swelling of both lower extremities     PAST SURGICAL HISTORY: Past Surgical History:  Procedure Laterality Date  . COLONOSCOPY N/A 01/06/2014    Procedure: COLONOSCOPY;  Surgeon: Malissa Hippo, MD;  Location: [facility];  Service: Endoscopy;  Laterality: N/A;  830  . FOOT SURGERY    . PARTIAL HYSTERECTOMY  jan 1999    SOCIAL HISTORY: Social History   Tobacco Use  . Smoking status: Never Smoker  . Smokeless tobacco: Never Used  Substance Use Topics  . Alcohol use: No  . Drug use: Not on file    FAMILY HISTORY: Family History  Problem Relation Age of Onset  . Asthma Mother   . Heart disease Mother   . Pancreatic cancer Mother   . Ovarian cancer Mother   . Diabetes Mother   . Stroke Mother   . Asthma Father   . Colon cancer Father   . Prostate cancer Father   . Stroke Father   . High blood pressure Father   . Asthma Sister   . Cancer Paternal Grandmother        mouth    ROS: Review of Systems  Constitutional: Positive for malaise/fatigue. Negative for weight loss.  Gastrointestinal: Negative for nausea and vomiting.  Musculoskeletal:       Negative for muscle weakness    PHYSICAL EXAM: Blood pressure 139/78, pulse (!) 58, temperature 98 F (36.7 C), temperature source Oral, height 5\' 3"  (1.6 m), weight (!) 326 lb (147.9 kg), SpO2 100 %. Body mass index is 57.75 kg/m. Physical Exam Vitals signs reviewed.  Constitutional:      Appearance: Normal appearance. She is obese.  Cardiovascular:     Rate and Rhythm: Normal rate.     Pulses: Normal pulses.  Pulmonary:     Effort: Pulmonary effort is normal.  Musculoskeletal: Normal range of motion.  Skin:    General: Skin is warm and dry.  Neurological:     Mental Status: She is alert and oriented to person, place, and time.  Psychiatric:        Mood and Affect: Mood normal.        Behavior: Behavior normal.     RECENT LABS AND TESTS: BMET    Component Value Date/Time   NA 140 04/15/2011 1745   K 3.4 (L) 04/15/2011 1745   CL 105 04/15/2011 1745   CO2 27 04/15/2011 1745   GLUCOSE 90 04/15/2011 1745   BUN 13 04/15/2011 1745   CREATININE 0.51  04/15/2011 1745   CALCIUM 9.8 04/15/2011 1745   GFRNONAA >90 04/15/2011 1745   GFRAA >90 04/15/2011 1745   Lab Results  Component Value Date   HGBA1C 5.4 04/08/2018   Lab Results  Component Value Date   INSULIN 4.4 04/08/2018   CBC    Component Value Date/Time   WBC 5.6 04/15/2011 1745   RBC 4.44 04/15/2011 1745   HGB 12.2 04/15/2011 1745   HCT 37.3 04/15/2011 1745   PLT 202 04/15/2011 1745   MCV 84.0 04/15/2011 1745  MCH 27.5 04/15/2011 1745   MCHC 32.7 04/15/2011 1745   RDW 14.0 04/15/2011 1745   LYMPHSABS 1.9 04/15/2011 1745   MONOABS 0.3 04/15/2011 1745   EOSABS 0.2 04/15/2011 1745   BASOSABS 0.0 04/15/2011 1745   Iron/TIBC/Ferritin/ %Sat No results found for: IRON, TIBC, FERRITIN, IRONPCTSAT Lipid Panel  No results found for: CHOL, TRIG, HDL, CHOLHDL, VLDL, LDLCALC, LDLDIRECT Hepatic Function Panel     Component Value Date/Time   PROT 7.3 04/15/2011 1745   ALBUMIN 4.1 04/15/2011 1745   AST 13 04/15/2011 1745   ALT 16 04/15/2011 1745   ALKPHOS 67 04/15/2011 1745   BILITOT 0.5 04/15/2011 1745   No results found for: TSH   Ref. Range 04/08/2018 10:44  Vitamin D, 25-Hydroxy Latest Ref Range: 30.0 - 100.0 ng/mL 6.1 (L)      OBESITY BEHAVIORAL INTERVENTION VISIT  Today's visit was # 3   Starting weight: 336 lbs Starting date: 04/08/2018 Today's weight : 326 lbs Today's date: 05/06/2018 Total lbs lost to date: 10   ASK: We discussed the diagnosis of obesity with Lisaanne C Teague today and Britteny agreed to give us permission to discuss obesity behavioral modification therapy today.  ASSESS: Takelia has the diagnosis of obesity and her BMI today is 57.76 Lilleigh is in the action stage of change   ADVISE: Ariona was educated on the multiple health risks of obesity as well as the benefit of weight loss to improve her health. She was advised of the need for long term treatment and the importance of lifestyle modifications to improve her current health and to decrease  her risk of future health problems.  AGREE: Multiple dietary modification options and treatment options were discussed and  Suprina agreed to follow the recommendations documented in the above note.  ARRANGE: Loura was educated on the importance of frequent visits to treat obesity as outlined per CMS and USPSTF guidelines and agreed to schedule her next follow up appointment today.  I, Tammy Wysor am acting as Energy managertranscriptionist for [employer] [place], FNP-C.  I have reviewed the above documentation for accuracy and completeness, and I agree with the above.  - Zaiyden Strozier, FNP-C.

## 2018-05-12 NOTE — Telephone Encounter (Signed)
Received synvisc injections. Pt notified they've been received, first appointment booked to start 3 shot series.

## 2018-05-13 ENCOUNTER — Encounter: Payer: Self-pay | Admitting: Orthopaedic Surgery

## 2018-05-13 ENCOUNTER — Ambulatory Visit: Payer: No Typology Code available for payment source | Admitting: Orthopaedic Surgery

## 2018-05-13 VITALS — Ht 63.0 in | Wt 326.0 lb

## 2018-05-13 DIAGNOSIS — G8929 Other chronic pain: Secondary | ICD-10-CM | POA: Diagnosis not present

## 2018-05-13 DIAGNOSIS — M25561 Pain in right knee: Secondary | ICD-10-CM | POA: Diagnosis not present

## 2018-05-13 DIAGNOSIS — Z6841 Body Mass Index (BMI) 40.0 and over, adult: Secondary | ICD-10-CM

## 2018-05-13 NOTE — Progress Notes (Signed)
PROCEDURE NOTE:  Synvisc injection #  1 of 3   Injection 1 vial of Synvisc into right  knee  Ht 5\' 3"  (1.6 m)   Wt (!) 326 lb (147.9 kg)   BMI 57.75 kg/m   The right knee exam: there was no synovitis or infection   The knee was prepped sterilely  Ethyl chloride was used to anesthetize the skin A 20 g needle was used to inject the knee with 1 vial of Synvisc A sterile dressing was placed  There were no complications  Encounter Diagnoses  Name Primary?  . Chronic pain of right knee Yes  . Body mass index 60.0-69.9, adult (HCC)   . Morbid obesity (HCC)     Follow up one week  Electronically Signed Darreld Mclean, MD 2/19/20209:46 AM

## 2018-05-13 NOTE — Patient Instructions (Signed)
Out of work tonight 

## 2018-05-19 ENCOUNTER — Encounter: Payer: Self-pay | Admitting: Orthopaedic Surgery

## 2018-05-19 ENCOUNTER — Ambulatory Visit: Payer: No Typology Code available for payment source | Admitting: Orthopaedic Surgery

## 2018-05-19 VITALS — Ht 63.0 in | Wt 326.0 lb

## 2018-05-19 DIAGNOSIS — G8929 Other chronic pain: Secondary | ICD-10-CM | POA: Diagnosis not present

## 2018-05-19 DIAGNOSIS — M25561 Pain in right knee: Secondary | ICD-10-CM | POA: Diagnosis not present

## 2018-05-19 DIAGNOSIS — Z6841 Body Mass Index (BMI) 40.0 and over, adult: Secondary | ICD-10-CM

## 2018-05-19 NOTE — Progress Notes (Signed)
PROCEDURE NOTE:  Synvisc injection #  2 of 3   Injection 1 vial of Synvisc into right  knee  Ht 5\' 3"  (1.6 m)   Wt (!) 326 lb (147.9 kg)   BMI 57.75 kg/m   The right knee exam: there was no synovitis or infection   The knee was prepped sterilely  Ethyl chloride was used to anesthetize the skin A 20 g needle was used to inject the knee with 1 vial of Synvisc A sterile dressing was placed  There were no complications  Encounter Diagnoses  Name Primary?  . Chronic pain of right knee Yes  . Body mass index 60.0-69.9, adult (HCC)   . Morbid obesity (HCC)     Follow up one week  Electronically Signed Darreld Mclean, MD 2/25/20202:50 PM

## 2018-05-20 ENCOUNTER — Ambulatory Visit (HOSPITAL_COMMUNITY)
Admission: RE | Admit: 2018-05-20 | Discharge: 2018-05-20 | Disposition: A | Discharge status: Home / Self Care | Payer: No Typology Code available for payment source | Source: Ambulatory Visit | Attending: Internal Medicine | Admitting: Internal Medicine

## 2018-05-20 DIAGNOSIS — Z1231 Encounter for screening mammogram for malignant neoplasm of breast: Secondary | ICD-10-CM | POA: Diagnosis not present

## 2018-05-26 ENCOUNTER — Encounter: Payer: Self-pay | Admitting: Orthopaedic Surgery

## 2018-05-26 ENCOUNTER — Ambulatory Visit (INDEPENDENT_AMBULATORY_CARE_PROVIDER_SITE_OTHER): Payer: No Typology Code available for payment source | Admitting: Family Medicine

## 2018-05-26 ENCOUNTER — Encounter (INDEPENDENT_AMBULATORY_CARE_PROVIDER_SITE_OTHER): Payer: Self-pay | Admitting: Family Medicine

## 2018-05-26 ENCOUNTER — Ambulatory Visit: Payer: No Typology Code available for payment source | Admitting: Orthopaedic Surgery

## 2018-05-26 VITALS — BP 118/77 | HR 59 | Temp 98.3°F | Ht 63.0 in | Wt 331.0 lb

## 2018-05-26 DIAGNOSIS — I10 Essential (primary) hypertension: Secondary | ICD-10-CM

## 2018-05-26 DIAGNOSIS — E559 Vitamin D deficiency, unspecified: Secondary | ICD-10-CM | POA: Diagnosis not present

## 2018-05-26 DIAGNOSIS — G8929 Other chronic pain: Secondary | ICD-10-CM

## 2018-05-26 DIAGNOSIS — Z9189 Other specified personal risk factors, not elsewhere classified: Secondary | ICD-10-CM

## 2018-05-26 DIAGNOSIS — M25561 Pain in right knee: Secondary | ICD-10-CM

## 2018-05-26 DIAGNOSIS — Z6841 Body Mass Index (BMI) 40.0 and over, adult: Secondary | ICD-10-CM

## 2018-05-26 MED ORDER — VITAMIN D (ERGOCALCIFEROL) 1.25 MG (50000 UNIT) PO CAPS: 50000.0000 [IU] | ORAL_CAPSULE | ORAL | 0 refills | Status: DC

## 2018-05-26 NOTE — Progress Notes (Signed)
PROCEDURE NOTE:  Synvisc injection #  3 of 3   Injection 1 vial of Synvisc into right  knee  There were no vitals taken for this visit.  The right knee exam: there was no synovitis or infection   The knee was prepped sterilely  Ethyl chloride was used to anesthetize the skin A 20 g needle was used to inject the knee with 1 vial of Synvisc A sterile dressing was placed  There were no complications  Encounter Diagnoses  Name Primary?  . Chronic pain of right knee Yes  . Body mass index 60.0-69.9, adult (HCC)   . Morbid obesity (HCC)     Follow up three weeks  Electronically Signed Darreld Mclean, MD 3/3/20203:08 PM

## 2018-05-26 NOTE — Progress Notes (Signed)
Office: [phone]  /  Fax: [phone]   HPI:   Chief Complaint: OBESITY Whitney Bauer is here to discuss her progress with her obesity treatment plan. She is on the Category 3 plan and was counting 1200-1500 calories and 100 grams of protein. She is following her eating plan approximately 0% of the time. She states she is exercising 0 minutes 0 times per week. Whitney Bauer has been counting calories over the past 3 weeks but has not been successful with this. She is very bored and frustrated with food choices. Her weight is (!) 331 lb (150.1 kg) today and has had a weight gain of 5 lbs since her last visit. She has lost 5 lbs since starting treatment with [place].  Vitamin D deficiency Whitney Bauer has a diagnosis of Vitamin D deficiency which is not at goal. Her last Vitamin D level was reported at 6.1 on 04/08/2018. She is currently taking prescription Vit D and denies nausea, vomiting or muscle weakness.  Hypertension Whitney Bauer is a 63 y.o. female with hypertension which is well controlled on chlorthalidone/HCTZ.  Whitney Bauer denies chest pain or shortness of breath on exertion. She is working weight loss to help control her blood pressure with the goal of decreasing her risk of heart attack and stroke. Whitney Bauer's blood pressure is currently controlled.  At risk for cardiovascular disease Whitney Bauer is at a higher than average risk for cardiovascular disease due to obesity. She currently denies any chest pain.  ASSESSMENT AND PLAN:  Vitamin D deficiency - Plan: Vitamin D, Ergocalciferol, (DRISDOL) 1.25 MG (50000 UT) CAPS capsule  Essential hypertension  At risk for heart disease  Class 3 severe obesity with serious comorbidity and body mass index (BMI) of 50.0 to 59.9 in adult, unspecified obesity type (HCC)  PLAN:  Vitamin D Deficiency Whitney Bauer was informed that low Vitamin D levels contributes to fatigue and are associated with obesity, breast, and colon cancer. She agrees to continue to take prescription  Vit D @ 50,000 IU every week #4 with 0 refills and will follow-up for routine testing of Vitamin D, at least 2-3 times per year. She was informed of the risk of over-replacement of Vitamin D and agrees to not increase her dose unless she discusses this with [place] first. [name] agrees to follow-up with our clinic in 3 weeks.  Hypertension We discussed sodium restriction, working on healthy weight loss, and a regular exercise program as the means to achieve improved blood pressure control. Whitney Bauer agreed with this plan and agreed to follow up as directed. We will continue to monitor her blood pressure as well as her progress with the above lifestyle modifications. She will continue her medications as prescribed and will watch for signs of hypotension as she continues her lifestyle modifications.  Cardiovascular risk counseling Whitney Bauer was given extended (15 minutes) coronary artery disease prevention counseling today. She is 63 y.o. female and has risk factors for heart disease including obesity. We discussed intensive lifestyle modifications today with an emphasis on specific weight loss instructions and strategies. Pt was also informed of the importance of increasing exercise and decreasing saturated fats to help prevent heart disease.  Obesity Whitney Bauer is currently in the action stage of change. As such, her goal is to continue with weight loss efforts. She has agreed to follow the Category 3 plan or journaling 1400-1500 calories plus 90-100 grams of protein daily. Whitney Bauer was given handouts on Dining Out, Protein Content, and Journaling. She was advised she must keep a strict  food journal to be successful with weight loss. I demonstrated how to use the My Fitness Pal. Whitney Bauer has not been prescribed exercise at this time. We discussed the following Behavioral Modification Strategies today: increasing lean protein intake, work on meal planning and easy cooking plans, planning for success, and keep a strict food  journal.  Whitney Bauer has agreed to follow-up with our clinic in 3 weeks. She was informed of the importance of frequent follow up visits to maximize her success with intensive lifestyle modifications for her multiple health conditions.  ALLERGIES: No Known Allergies  MEDICATIONS: Current Outpatient Medications on File Prior to Visit  Medication Sig Dispense Refill  . atenolol-chlorthalidone (TENORETIC) 50-25 MG tablet Take 1 tablet by mouth daily.    . cyclobenzaprine (FLEXERIL) 10 MG tablet Take 10 mg by mouth as needed for muscle spasms.     . diclofenac (VOLTAREN) 75 MG EC tablet TAKE 1 TABLET (75 MG TOTAL) BY MOUTH 2 (TWO) TIMES DAILY WITH A MEAL. 60 tablet 5  . HYDROcodone-acetaminophen (NORCO) 7.5-325 MG tablet Take 1 tablet by mouth every 6 (six) hours as needed for moderate pain (Must last 30 days.). 110 tablet 0  . loperamide (IMODIUM) 1 MG/5ML solution Take 6 mg by mouth as needed for diarrhea or loose stools (patient uses at least twice weekly).    . pantoprazole (PROTONIX) 40 MG tablet Take 1 tablet (40 mg total) by mouth daily. 90 tablet 3   No current facility-administered medications on file prior to visit.     PAST MEDICAL HISTORY: Past Medical History:  Diagnosis Date  . Anemia   . Arthritis    bilat. knees  . Back pain   . Constipation   . Hypertension   . Joint pain   . Lactose intolerance   . Rheumatoid arthritis (HCC)   . Swelling of both lower extremities     PAST SURGICAL HISTORY: Past Surgical History:  Procedure Laterality Date  . COLONOSCOPY N/A 01/06/2014   Procedure: COLONOSCOPY;  Surgeon: Malissa Hippo, MD;  [facility];  Service: Endoscopy;  Laterality: N/A;  830  . FOOT SURGERY    . PARTIAL HYSTERECTOMY  jan 1999    SOCIAL HISTORY: Social History   Tobacco Use  . Smoking status: Never Smoker  . Smokeless tobacco: Never Used  Substance Use Topics  . Alcohol use: No  . Drug use: Not on file    FAMILY HISTORY: Family History   Problem Relation Age of Onset  . Asthma Mother   . Heart disease Mother   . Pancreatic cancer Mother   . Ovarian cancer Mother   . Diabetes Mother   . Stroke Mother   . Asthma Father   . Colon cancer Father   . Prostate cancer Father   . Stroke Father   . High blood pressure Father   . Asthma Sister   . Cancer Paternal Grandmother        mouth   ROS: Review of Systems  Constitutional: Negative for weight loss.  Respiratory: Negative for shortness of breath.   Cardiovascular: Negative for chest pain.  Gastrointestinal: Negative for nausea and vomiting.  Musculoskeletal:       Negative for muscle weakness.  Endo/Heme/Allergies:       Negative for hypoglycemia.   PHYSICAL EXAM: Blood pressure 118/77, pulse (!) 59, temperature 98.3 F (36.8 C), temperature source Oral, height 5\' 3"  (1.6 m), weight (!) 331 lb (150.1 kg), SpO2 100 %. Body mass index is 58.63  kg/m. Physical Exam Vitals signs reviewed.  Constitutional:      Appearance: Normal appearance. She is obese.  Cardiovascular:     Rate and Rhythm: Normal rate.     Pulses: Normal pulses.  Pulmonary:     Effort: Pulmonary effort is normal.     Breath sounds: Normal breath sounds.  Musculoskeletal: Normal range of motion.  Skin:    General: Skin is warm and dry.  Neurological:     Mental Status: She is alert and oriented to person, place, and time.     Comments: Uses a cane for ambulation.  Psychiatric:        Behavior: Behavior normal.   RECENT LABS AND TESTS: BMET    Component Value Date/Time   NA 140 04/15/2011 1745   K 3.4 (L) 04/15/2011 1745   CL 105 04/15/2011 1745   CO2 27 04/15/2011 1745   GLUCOSE 90 04/15/2011 1745   BUN 13 04/15/2011 1745   CREATININE 0.51 04/15/2011 1745   CALCIUM 9.8 04/15/2011 1745   GFRNONAA >90 04/15/2011 1745   GFRAA >90 04/15/2011 1745   Lab Results  Component Value Date   HGBA1C 5.4 04/08/2018   Lab Results  Component Value Date   INSULIN 4.4 04/08/2018    CBC    Component Value Date/Time   WBC 5.6 04/15/2011 1745   RBC 4.44 04/15/2011 1745   HGB 12.2 04/15/2011 1745   HCT 37.3 04/15/2011 1745   PLT 202 04/15/2011 1745   MCV 84.0 04/15/2011 1745   MCH 27.5 04/15/2011 1745   MCHC 32.7 04/15/2011 1745   RDW 14.0 04/15/2011 1745   LYMPHSABS 1.9 04/15/2011 1745   MONOABS 0.3 04/15/2011 1745   EOSABS 0.2 04/15/2011 1745   BASOSABS 0.0 04/15/2011 1745   Iron/TIBC/Ferritin/ %Sat No results found for: IRON, TIBC, FERRITIN, IRONPCTSAT Lipid Panel  No results found for: CHOL, TRIG, HDL, CHOLHDL, VLDL, LDLCALC, LDLDIRECT Hepatic Function Panel     Component Value Date/Time   PROT 7.3 04/15/2011 1745   ALBUMIN 4.1 04/15/2011 1745   AST 13 04/15/2011 1745   ALT 16 04/15/2011 1745   ALKPHOS 67 04/15/2011 1745   BILITOT 0.5 04/15/2011 1745   No results found for: TSH   Ref. Range 04/08/2018 10:44  Vitamin D, 25-Hydroxy Latest Ref Range: 30.0 - 100.0 ng/mL 6.1 (L)   OBESITY BEHAVIORAL INTERVENTION VISIT  Today's visit was #4  Starting weight: 336 lbs Starting date: 04/08/2018 Today's weight: 331 lbs Today's date: 05/26/2018 Total lbs lost to date: 5    05/26/2018  Height  (1.6 m)  Weight 331 lb (150.1 kg) (A)  BMI (Calculated) 58.65  BLOOD PRESSURE - SYSTOLIC 118  BLOOD PRESSURE - DIASTOLIC 77   Body Fat % 66.3 %   ASK: We discussed the diagnosis of obesity with Whitney Bauer today and Whitney Bauer agreed to give [place] permission to discuss obesity behavioral modification therapy today.  ASSESS: Whitney Bauer has the diagnosis of obesity and her BMI today is 58.65. Whitney Bauer is in the action stage of change.   ADVISE: Whitney Bauer was educated on the multiple health risks of obesity as well as the benefit of weight loss to improve her health. She was advised of the need for long term treatment and the importance of lifestyle modifications to improve her current health and to decrease her risk of future health problems.  AGREE: Multiple dietary  modification options and treatment options were discussed and  Whitney Bauer agreed to follow the recommendations documented in the  above note.  ARRANGE: Whitney Bauer was educated on the importance of frequent visits to treat obesity as outlined per CMS and USPSTF guidelines and agreed to schedule her next follow up appointment today.  Whitney Bauer, Denise Haag, am acting as Energy managertranscriptionist for [place] Marjani Kobel, FNP-C.  I have reviewed the above documentation for accuracy and completeness, and I agree with the above.  - Gurpreet Mikhail, FNP-C.

## 2018-06-01 ENCOUNTER — Telehealth: Payer: Self-pay | Admitting: Orthopaedic Surgery

## 2018-06-01 NOTE — Telephone Encounter (Signed)
Call received from [facility] at [employer], ph #[phone]; states case is still open, and she is following up to find out if patient received the medication or if she is still needing the [employer]. Per [place], Dr Sanjuan Dame nurse, verified that patient has already received the medication, and has completed the series of 3 injections.  Di Kindle states she will now close the case.

## 2018-06-10 ENCOUNTER — Ambulatory Visit (INDEPENDENT_AMBULATORY_CARE_PROVIDER_SITE_OTHER): Payer: No Typology Code available for payment source | Admitting: Dietician

## 2018-06-10 ENCOUNTER — Encounter (INDEPENDENT_AMBULATORY_CARE_PROVIDER_SITE_OTHER): Payer: Self-pay

## 2018-06-17 ENCOUNTER — Encounter (INDEPENDENT_AMBULATORY_CARE_PROVIDER_SITE_OTHER): Payer: Self-pay

## 2018-06-18 ENCOUNTER — Encounter (INDEPENDENT_AMBULATORY_CARE_PROVIDER_SITE_OTHER): Payer: Self-pay

## 2018-06-23 ENCOUNTER — Ambulatory Visit: Payer: No Typology Code available for payment source | Admitting: Orthopaedic Surgery

## 2018-06-23 ENCOUNTER — Ambulatory Visit (INDEPENDENT_AMBULATORY_CARE_PROVIDER_SITE_OTHER): Payer: No Typology Code available for payment source | Admitting: Family Medicine

## 2018-07-07 ENCOUNTER — Other Ambulatory Visit: Payer: Self-pay

## 2018-07-07 ENCOUNTER — Ambulatory Visit (INDEPENDENT_AMBULATORY_CARE_PROVIDER_SITE_OTHER): Payer: No Typology Code available for payment source | Admitting: Orthopaedic Surgery

## 2018-07-07 ENCOUNTER — Encounter: Payer: Self-pay | Admitting: Orthopaedic Surgery

## 2018-07-07 DIAGNOSIS — Z6841 Body Mass Index (BMI) 40.0 and over, adult: Secondary | ICD-10-CM | POA: Diagnosis not present

## 2018-07-07 DIAGNOSIS — G8929 Other chronic pain: Secondary | ICD-10-CM | POA: Diagnosis not present

## 2018-07-07 DIAGNOSIS — M25561 Pain in right knee: Secondary | ICD-10-CM | POA: Diagnosis not present

## 2018-07-07 MED ORDER — OXYCODONE-ACETAMINOPHEN 5-325 MG PO TABS: 1.0000 | ORAL_TABLET | Freq: Four times a day (QID) | ORAL | 0 refills | Status: AC | PRN

## 2018-07-07 NOTE — Progress Notes (Addendum)
Virtual Visit via Video Note  I connected with Whitney Bauer on 07/07/18 at  8:00 AM EDT by a video enabled telemedicine application and verified that I am speaking with the correct person using two identifiers.   I discussed the limitations of evaluation and management by telemedicine and the availability of in person appointments. The patient expressed understanding and agreed to proceed.  History of Present Illness: She has continued pain of the right knee more when standing and sitting.  She has no new trauma.  She has popping and swelling. She has no giving way.  She is taking her medicine.   Observations/Objective: Per above   Assessment and Plan: Encounter Diagnoses  Name Primary?  . Chronic pain of right knee Yes  . Body mass index 60.0-69.9, adult (HCC)   . Morbid obesity (HCC)      Follow Up Instructions: I will call in oxycodone and have her continue present course.  I will set up appointment in six weeks by telephone.  Call if needed.  I have reviewed the [place] Controlled Substance Reporting System web site prior to prescribing narcotic medicine for this patient.      I discussed the assessment and treatment plan with the patient. The patient was provided an opportunity to ask questions and all were answered. The patient agreed with the plan and demonstrated an understanding of the instructions.   The patient was advised to call back or seek an in-person evaluation if the symptoms worsen or if the condition fails to improve as anticipated.  I provided 5 minutes of non-face-to-face time during this encounter.   Darreld Mclean, MD

## 2018-08-18 ENCOUNTER — Ambulatory Visit: Payer: Self-pay | Admitting: Orthopaedic Surgery

## 2018-08-19 ENCOUNTER — Encounter: Payer: Self-pay | Admitting: Orthopaedic Surgery

## 2018-08-19 ENCOUNTER — Other Ambulatory Visit: Payer: Self-pay

## 2018-08-19 ENCOUNTER — Ambulatory Visit (INDEPENDENT_AMBULATORY_CARE_PROVIDER_SITE_OTHER): Payer: No Typology Code available for payment source | Admitting: Orthopaedic Surgery

## 2018-08-19 DIAGNOSIS — Z6841 Body Mass Index (BMI) 40.0 and over, adult: Secondary | ICD-10-CM | POA: Diagnosis not present

## 2018-08-19 DIAGNOSIS — M25562 Pain in left knee: Secondary | ICD-10-CM | POA: Diagnosis not present

## 2018-08-19 DIAGNOSIS — M25561 Pain in right knee: Secondary | ICD-10-CM | POA: Diagnosis not present

## 2018-08-19 DIAGNOSIS — G8929 Other chronic pain: Secondary | ICD-10-CM

## 2018-08-19 NOTE — Progress Notes (Signed)
Virtual Visit via Telephone Note  I connected with@ on 08/19/18 at  8:30 AM EDT by telephone and verified that I am speaking with the correct person using two identifiers.  Location: Patient: home Provider: home   I discussed the limitations, risks, security and privacy concerns of performing an evaluation and management service by telephone and the availability of in person appointments. I also discussed with the patient that there may be a patient responsible charge related to this service. The patient expressed understanding and agreed to proceed.   History of Present Illness: She has pain in the knees.  She has no new trauma.  The rainy weather has caused more pain  She is taking the diclofenac every 12 hours and it helps.  She has no redness.  She is trying to be active.   Observations/Objective: Per above.  Assessment and Plan: Encounter Diagnoses  Name Primary?  . Chronic pain of right knee Yes  . Body mass index 60.0-69.9, adult (HCC)   . Morbid obesity (HCC)   . Chronic pain of left knee      Follow Up Instructions: Six weeks.   Call if any problem.  Precautions discussed.    I discussed the assessment and treatment plan with the patient. The patient was provided an opportunity to ask questions and all were answered. The patient agreed with the plan and demonstrated an understanding of the instructions.   The patient was advised to call back or seek an in-person evaluation if the symptoms worsen or if the condition fails to improve as anticipated.  I provided 6 minutes of non-face-to-face time during this encounter.   Darreld Mclean, MD

## 2018-08-31 ENCOUNTER — Other Ambulatory Visit: Payer: Self-pay

## 2018-08-31 ENCOUNTER — Other Ambulatory Visit: Payer: No Typology Code available for payment source

## 2018-08-31 DIAGNOSIS — Z20822 Contact with and (suspected) exposure to covid-19: Secondary | ICD-10-CM

## 2018-09-03 LAB — NOVEL CORONAVIRUS, NAA: SARS-CoV-2, NAA: NOT DETECTED

## 2018-09-09 ENCOUNTER — Encounter (INDEPENDENT_AMBULATORY_CARE_PROVIDER_SITE_OTHER): Payer: Self-pay

## 2018-12-25 ENCOUNTER — Encounter (INDEPENDENT_AMBULATORY_CARE_PROVIDER_SITE_OTHER): Payer: Self-pay | Admitting: *Deleted

## 2018-12-29 ENCOUNTER — Ambulatory Visit: Payer: No Typology Code available for payment source | Admitting: Orthopaedic Surgery

## 2018-12-29 ENCOUNTER — Other Ambulatory Visit: Payer: Self-pay

## 2018-12-29 ENCOUNTER — Encounter: Payer: Self-pay | Admitting: Orthopaedic Surgery

## 2018-12-29 VITALS — BP 122/62 | HR 62 | Ht 63.0 in | Wt 329.0 lb

## 2018-12-29 DIAGNOSIS — M25561 Pain in right knee: Secondary | ICD-10-CM | POA: Diagnosis not present

## 2018-12-29 DIAGNOSIS — G8929 Other chronic pain: Secondary | ICD-10-CM

## 2018-12-29 DIAGNOSIS — Z6841 Body Mass Index (BMI) 40.0 and over, adult: Secondary | ICD-10-CM

## 2018-12-29 NOTE — Progress Notes (Signed)
CC:  I have pain of my right knee. I would like an injection.  The patient has chronic pain of the right knee.  There is no recent trauma.  There is no redness.  Injections in the past have helped.  The knee has no redness, has an effusion and crepitus present.  ROM of the right knee is 0-100.  Impression:  Chronic knee pain right  Return: prn  PROCEDURE NOTE:  The patient requests injections of the right knee , verbal consent was obtained.  The right knee was prepped appropriately after time out was performed.   Sterile technique was observed and injection of 1 cc of Depo-Medrol 40 mg with several cc's of plain xylocaine. Anesthesia was provided by ethyl chloride and a 20-gauge needle was used to inject the knee area. The injection was tolerated well.  A band aid dressing was applied.  The patient was advised to apply ice later today and tomorrow to the injection sight as needed.  Electronically Signed Sanjuana Kava, MD 10/6/202011:39 AM

## 2018-12-31 ENCOUNTER — Telehealth: Payer: Self-pay | Admitting: Orthopaedic Surgery

## 2018-12-31 NOTE — Telephone Encounter (Signed)
Called patient to notify forms have been completed by [employer], signed by Dr Luna Glasgow, and are ready for pickup as requested. Left message.

## 2019-02-11 ENCOUNTER — Telehealth: Payer: Self-pay | Admitting: Orthopaedic Surgery

## 2019-02-11 NOTE — Telephone Encounter (Signed)
Patient's intermittent FMLA forms, initially processed by [employer], our contracted forms provider, have been appended as per employer ([place] [employer]) request, with start date of intermittent leave request 01/03/2019 through 01/03/2020. Patient picked up forms to mail to employer, as had done initially as requested.

## 2019-04-06 ENCOUNTER — Other Ambulatory Visit: Payer: Self-pay

## 2019-04-06 ENCOUNTER — Ambulatory Visit: Payer: No Typology Code available for payment source | Attending: Internal Medicine

## 2019-04-06 DIAGNOSIS — Z20822 Contact with and (suspected) exposure to covid-19: Secondary | ICD-10-CM

## 2019-04-07 LAB — NOVEL CORONAVIRUS, NAA: SARS-CoV-2, NAA: DETECTED — AB

## 2019-04-08 ENCOUNTER — Telehealth: Payer: Self-pay

## 2019-04-08 ENCOUNTER — Telehealth: Payer: Self-pay | Admitting: *Deleted

## 2019-04-08 NOTE — Telephone Encounter (Signed)
Attempted to return call to patient, no answer, left message to return call.

## 2019-04-08 NOTE — Telephone Encounter (Signed)
Pt would like to speak to a nurse regarding her test result (Covid) Will send message to nurse triage.   Whitney Bauer

## 2019-04-08 NOTE — Telephone Encounter (Signed)
Attempted to call pt.  Left vm to return call to [phone] re: COVID test results.

## 2019-04-08 NOTE — Telephone Encounter (Signed)
Patient notified of + COVID test result. Questioned about symptoms, instructed in cleansing and quarantine and isolation practices, and dates with criteria to end each. Instructed when to seek medical care at Urgent Care/ER, masking if leave isolation. Educated on why re-testing is not necessary. Pt to notify PCP incase further follow up needed and informed that the Health Department notified. Questions answered, pt verbalized understanding of instructions.

## 2019-04-09 ENCOUNTER — Telehealth: Payer: Self-pay | Admitting: Nurse Practitioner

## 2019-04-09 ENCOUNTER — Telehealth: Payer: Self-pay

## 2019-04-09 NOTE — Telephone Encounter (Signed)
Patient tested positive for covid last Wednesday (1/6) and needs to have someone wash her clothes. She wants to know if they will be put a risk by washing her clothes. Will reach out to a nurse for support and have them call her back.   Joycelyn Rua Hopkins

## 2019-04-09 NOTE — Telephone Encounter (Signed)
ret'd call to pt.  Advised to presort her laundry in separate bags, prior to having someone take them to the [facility].  Advised that the person washing her clothes should wear mask/ gloves, avoid touching face with their hands, observe freq. hand washing, practice social distancing at the [place].  Reassured pt. That it is not as likely to catch the virus from soft surfaces, as compared to hard surfaces.  Pt. Stated she intends to wait and do her own laundry, once she comes out of quarantine.  All questions answered.  Verb. Understanding.

## 2019-04-09 NOTE — Telephone Encounter (Signed)
Called to Discuss with patient about Covid symptoms and the use of bamlanivimab, a monoclonal antibody infusion for those with mild to moderate Covid symptoms and at a high risk of hospitalization.     Pt is qualified for this infusion at the [facility] infusion center due to co-morbid conditions and/or a member of an at-risk group.     Unable to reach pt  

## 2019-04-26 ENCOUNTER — Other Ambulatory Visit: Payer: Self-pay | Admitting: Orthopaedic Surgery

## 2019-06-02 ENCOUNTER — Ambulatory Visit: Payer: No Typology Code available for payment source | Admitting: Orthopaedic Surgery

## 2019-06-03 ENCOUNTER — Encounter: Payer: Self-pay | Admitting: Orthopaedic Surgery

## 2019-06-03 ENCOUNTER — Ambulatory Visit: Payer: No Typology Code available for payment source | Admitting: Orthopaedic Surgery

## 2019-06-03 ENCOUNTER — Other Ambulatory Visit: Payer: Self-pay

## 2019-06-03 VITALS — BP 102/58 | HR 61 | Temp 97.5°F | Ht 63.0 in | Wt 324.0 lb

## 2019-06-03 DIAGNOSIS — M25562 Pain in left knee: Secondary | ICD-10-CM | POA: Diagnosis not present

## 2019-06-03 DIAGNOSIS — G8929 Other chronic pain: Secondary | ICD-10-CM

## 2019-06-03 DIAGNOSIS — Z6841 Body Mass Index (BMI) 40.0 and over, adult: Secondary | ICD-10-CM | POA: Diagnosis not present

## 2019-06-03 DIAGNOSIS — Z8616 Personal history of COVID-19: Secondary | ICD-10-CM

## 2019-06-03 DIAGNOSIS — R430 Anosmia: Secondary | ICD-10-CM

## 2019-06-03 NOTE — Progress Notes (Signed)
Patient Whitney Bauer, female DOB:08/11/1955, 64 y.o. UXN:[id number]  Chief Complaint  Patient presents with  . Knee Pain    Left knee     HPI  Whitney Bauer is a 64 y.o. female who has some swelling of the left lower leg anteriorly at times.  She has no trauma.  She has long standing DJD of the left knee.  She has no redness. She has no pain of the calf.  She is on blood pressure medicine and diuretics.    She has negative Homan's today and no swelling today. She has no redness.  I have talked to her about salt intake, changing medicine around and also about getting custom made support hose.  She will talk to Dr. Ouida Sills about her medicine.   Body mass index is 57.39 kg/m.  ROS  Review of Systems  HENT: Negative for congestion.   Respiratory: Negative for cough and shortness of breath.   Cardiovascular: Negative for chest pain and leg swelling.  Endocrine: Positive for cold intolerance.  Musculoskeletal: Positive for arthralgias, gait problem and joint swelling.  Allergic/Immunologic: Positive for environmental allergies.  All other systems reviewed and are negative.   All other systems reviewed and are negative.  The following is a summary of the past history medically, past history surgically, known current medicines, social history and family history.  This information is gathered electronically by the computer from prior information and documentation.  I review this each visit and have found including this information at this point in the chart is beneficial and informative.    Past Medical History:  Diagnosis Date  . Anemia   . Arthritis    bilat. knees  . Back pain   . Constipation   . Hypertension   . Joint pain   . Lactose intolerance   . Rheumatoid arthritis (HCC)   . Swelling of both lower extremities     Past Surgical History:  Procedure Laterality Date  . COLONOSCOPY N/A 01/06/2014   Procedure: COLONOSCOPY;  Surgeon: Malissa Hippo, MD;  [facility];  Service: Endoscopy;  Laterality: N/A;  830  . FOOT SURGERY    . PARTIAL HYSTERECTOMY  jan 1999    Family History  Problem Relation Age of Onset  . Asthma Mother   . Heart disease Mother   . Pancreatic cancer Mother   . Ovarian cancer Mother   . Diabetes Mother   . Stroke Mother   . Asthma Father   . Colon cancer Father   . Prostate cancer Father   . Stroke Father   . High blood pressure Father   . Asthma Sister   . Cancer Paternal Grandmother        mouth    Social History Social History   Tobacco Use  . Smoking status: Never Smoker  . Smokeless tobacco: Never Used  Substance Use Topics  . Alcohol use: No  . Drug use: Not on file    No Known Allergies  Current Outpatient Medications  Medication Sig Dispense Refill  . atenolol-chlorthalidone (TENORETIC) 50-25 MG tablet Take 1 tablet by mouth daily.    . cyclobenzaprine (FLEXERIL) 10 MG tablet Take 10 mg by mouth as needed for muscle spasms.     . diclofenac (VOLTAREN) 75 MG EC tablet TAKE 1 TABLET (75 MG TOTAL) BY MOUTH 2 (TWO) TIMES DAILY WITH A MEAL. 60 tablet 5  . loperamide (IMODIUM) 1 MG/5ML solution Take 6 mg by mouth as needed for  diarrhea or loose stools (patient uses at least twice weekly).    . pantoprazole (PROTONIX) 40 MG tablet Take 1 tablet (40 mg total) by mouth daily. 90 tablet 3  . Vitamin D, Ergocalciferol, (DRISDOL) 1.25 MG (50000 UT) CAPS capsule Take 1 capsule (50,000 Units total) by mouth every 7 (seven) days. 4 capsule 0   No current facility-administered medications for this visit.     Physical Exam  Blood pressure (!) 102/58, pulse 61, temperature (!) 97.5 F (36.4 C), height 5\' 3"  (1.6 m), weight (!) 324 lb (147 kg).  Constitutional: overall normal hygiene, normal nutrition, well developed, normal grooming, normal body habitus. Assistive device:none  Musculoskeletal: gait and station Limp left, muscle tone and strength are normal, no tremors or atrophy is present.  .   Neurological: coordination overall normal.  Deep tendon reflex/nerve stretch intact.  Sensation normal.  Cranial nerves II-XII intact.   Skin:   Normal overall no scars, lesions, ulcers or rashes. No psoriasis.  Psychiatric: Alert and oriented x 3.  Recent memory intact, remote memory unclear.  Normal mood and affect. Well groomed.  Good eye contact.  Cardiovascular: overall no swelling, no varicosities, no edema bilaterally, normal temperatures of the legs and arms, no clubbing, cyanosis and good capillary refill. She has no swelling today.  Lymphatic: palpation is normal.  All other systems reviewed and are negative   The patient has been educated about the nature of the problem(s) and counseled on treatment options.  The patient appeared to understand what I have discussed and is in agreement with it.  Encounter Diagnoses  Name Primary?  . Chronic pain of left knee Yes  . Body mass index 60.0-69.9, adult ([place])   . Morbid obesity due to excess calories ([place])   . History of COVID-19   . Loss of smell     PLAN Call if any problems.  Precautions discussed.  Continue current medications.   Return to clinic prn   Electronically Signed Sanjuana Kava, MD 3/11/20212:52 PM

## 2019-07-14 ENCOUNTER — Telehealth: Payer: Self-pay | Admitting: Orthopaedic Surgery

## 2019-07-14 NOTE — Telephone Encounter (Signed)
Patient requests refill on Oxycodone/Acetaminophen 5-325  Mgs.  Qty  110      Sig: Take 1 tablet by mouth every 6 (six) hours as needed for up to 30 days for moderate pain or severe pain (Must last 30 days.).   Patient states she uses [facility] in [place]

## 2019-07-14 NOTE — Telephone Encounter (Signed)
State site says she got refill on 07-04-2019.  Too early.

## 2019-08-05 ENCOUNTER — Ambulatory Visit: Payer: No Typology Code available for payment source | Admitting: Orthopaedic Surgery

## 2019-08-05 ENCOUNTER — Encounter: Payer: Self-pay | Admitting: Orthopaedic Surgery

## 2019-08-05 ENCOUNTER — Other Ambulatory Visit: Payer: Self-pay

## 2019-08-05 DIAGNOSIS — Z6841 Body Mass Index (BMI) 40.0 and over, adult: Secondary | ICD-10-CM

## 2019-08-05 DIAGNOSIS — G8929 Other chronic pain: Secondary | ICD-10-CM

## 2019-08-05 DIAGNOSIS — M25562 Pain in left knee: Secondary | ICD-10-CM | POA: Diagnosis not present

## 2019-08-05 MED ORDER — OXYCODONE-ACETAMINOPHEN 5-325 MG PO TABS: 1.0000 | ORAL_TABLET | Freq: Four times a day (QID) | ORAL | 0 refills | Status: AC | PRN

## 2019-08-05 NOTE — Progress Notes (Signed)
PROCEDURE NOTE:  The patient requests injections of the left knee , verbal consent was obtained.  The left knee was prepped appropriately after time out was performed.   Sterile technique was observed and injection of 1 cc of Depo-Medrol 40 mg with several cc's of plain xylocaine. Anesthesia was provided by ethyl chloride and a 20-gauge needle was used to inject the knee area. The injection was tolerated well.  A band aid dressing was applied.  The patient was advised to apply ice later today and tomorrow to the injection sight as needed.  I have reviewed the [place] Controlled Substance Reporting System web site prior to prescribing narcotic medicine for this patient.   Electronically Signed Darreld Mclean, MD 5/13/202110:44 AM

## 2019-08-10 ENCOUNTER — Ambulatory Visit: Payer: No Typology Code available for payment source | Attending: Internal Medicine

## 2019-08-10 DIAGNOSIS — Z23 Encounter for immunization: Secondary | ICD-10-CM

## 2019-08-10 NOTE — Progress Notes (Signed)
   Covid-19 Vaccination Clinic  Name:  ARLETTA LUMADUE    MRN: [id number] DOB: Jun 27, 1955  08/10/2019  Ms. Reddix was observed post Covid-19 immunization for 15 minutes without incident. She was provided with Vaccine Information Sheet and instruction to access the V-Safe system.   Ms. Vales was instructed to call 911 with any severe reactions post vaccine: Marland Kitchen Difficulty breathing  . Swelling of face and throat  . A fast heartbeat  . A bad rash all over body  . Dizziness and weakness   Immunizations Administered    Name Date Dose VIS Date Route   Moderna COVID-19 Vaccine 08/10/2019  8:50 AM 0.5 mL 02/2019 Intramuscular   Manufacturer: Moderna   Lot: [id number]   NDC: 73403-709-64

## 2019-09-09 ENCOUNTER — Ambulatory Visit: Payer: No Typology Code available for payment source

## 2019-09-09 ENCOUNTER — Encounter: Payer: Self-pay | Admitting: Orthopaedic Surgery

## 2019-09-09 ENCOUNTER — Other Ambulatory Visit: Payer: Self-pay

## 2019-09-09 ENCOUNTER — Ambulatory Visit: Payer: No Typology Code available for payment source | Admitting: Orthopaedic Surgery

## 2019-09-09 VITALS — BP 139/98 | HR 72 | Ht 63.0 in | Wt 317.0 lb

## 2019-09-09 DIAGNOSIS — Z6841 Body Mass Index (BMI) 40.0 and over, adult: Secondary | ICD-10-CM

## 2019-09-09 DIAGNOSIS — M542 Cervicalgia: Secondary | ICD-10-CM

## 2019-09-09 DIAGNOSIS — M5030 Other cervical disc degeneration, unspecified cervical region: Secondary | ICD-10-CM | POA: Diagnosis not present

## 2019-09-09 DIAGNOSIS — Z8616 Personal history of COVID-19: Secondary | ICD-10-CM

## 2019-09-09 MED ORDER — PREDNISONE 5 MG (21) PO TBPK: ORAL_TABLET | ORAL | 0 refills | Status: DC

## 2019-09-09 NOTE — Progress Notes (Signed)
Patient Whitney Bauer, female DOB:[birth year], 64 y.o. EAV:[id number]  Chief Complaint  Patient presents with  . Neck Pain    HPI  Whitney Bauer is a 64 y.o. female who has developed pain in the neck running to the left shoulder over the last several weeks.  She has pain in the left upper trapezius as well.  She has no numbness or paresthesias.  She is not improving with rest, heat, ice and her diclofenac.  She has no trauma.  She has long history of knee pain.   Body mass index is 56.15 kg/m.  The patient meets the AMA guidelines for Morbid (severe) obesity with a BMI > 40.0 and I have recommended weight loss.   ROS  Review of Systems  HENT: Negative for congestion.   Respiratory: Negative for cough and shortness of breath.   Cardiovascular: Negative for chest pain and leg swelling.  Endocrine: Positive for cold intolerance.  Musculoskeletal: Positive for arthralgias, gait problem and joint swelling.  Allergic/Immunologic: Positive for environmental allergies.  All other systems reviewed and are negative.   All other systems reviewed and are negative.  The following is a summary of the past history medically, past history surgically, known current medicines, social history and family history.  This information is gathered electronically by the computer from prior information and documentation.  I review this each visit and have found including this information at this point in the chart is beneficial and informative.    Past Medical History:  Diagnosis Date  . Anemia   . Arthritis    bilat. knees  . Back pain   . Constipation   . Hypertension   . Joint pain   . Lactose intolerance   . Rheumatoid arthritis ([place])   . Swelling of both lower extremities     Past Surgical History:  Procedure Laterality Date  . COLONOSCOPY N/A 01/06/2014   Procedure: COLONOSCOPY;  Surgeon: Rogene Houston, MD;  [facility];  Service: Endoscopy;  Laterality: N/A;  830  . FOOT  SURGERY    . PARTIAL HYSTERECTOMY  jan 1999    Family History  Problem Relation Age of Onset  . Asthma Mother   . Heart disease Mother   . Pancreatic cancer Mother   . Ovarian cancer Mother   . Diabetes Mother   . Stroke Mother   . Asthma Father   . Colon cancer Father   . Prostate cancer Father   . Stroke Father   . High blood pressure Father   . Asthma Sister   . Cancer Paternal Grandmother        mouth    Social History Social History   Tobacco Use  . Smoking status: Never Smoker  . Smokeless tobacco: Never Used  Substance Use Topics  . Alcohol use: No  . Drug use: Not on file    No Known Allergies  Current Outpatient Medications  Medication Sig Dispense Refill  . atenolol-chlorthalidone (TENORETIC) 50-25 MG tablet Take 1 tablet by mouth daily.    . cyclobenzaprine (FLEXERIL) 10 MG tablet Take 10 mg by mouth as needed for muscle spasms.     . diclofenac (VOLTAREN) 75 MG EC tablet TAKE 1 TABLET (75 MG TOTAL) BY MOUTH 2 (TWO) TIMES DAILY WITH A MEAL. 60 tablet 5  . loperamide (IMODIUM) 1 MG/5ML solution Take 6 mg by mouth as needed for diarrhea or loose stools (patient uses at least twice weekly).    . pantoprazole (PROTONIX) 40  MG tablet Take 1 tablet (40 mg total) by mouth daily. 90 tablet 3  . Vitamin D, Ergocalciferol, (DRISDOL) 1.25 MG (50000 UT) CAPS capsule Take 1 capsule (50,000 Units total) by mouth every 7 (seven) days. 4 capsule 0   No current facility-administered medications for this visit.     Physical Exam  Blood pressure (!) 139/98, pulse 72, height 5\' 3"  (1.6 m), weight (!) 317 lb (143.8 kg).  Constitutional: overall normal hygiene, normal nutrition, well developed, normal grooming, normal body habitus. Assistive device:cane  Musculoskeletal: gait and station Limp right, muscle tone and strength are normal, no tremors or atrophy is present.  .  Neurological: coordination overall normal.  Deep tendon reflex/nerve stretch intact.  Sensation  normal.  Cranial nerves II-XII intact.   Skin:   Normal overall no scars, lesions, ulcers or rashes. No psoriasis.  Psychiatric: Alert and oriented x 3.  Recent memory intact, remote memory unclear.  Normal mood and affect. Well groomed.  Good eye contact.  Cardiovascular: overall no swelling, no varicosities, no edema bilaterally, normal temperatures of the legs and arms, no clubbing, cyanosis and good capillary refill.  Lymphatic: palpation is normal.  Neck has full motion but tender more on the left side, no spasm.  ROM of the left shoulder is full.  Grips are normal.  Reflexes normal.   All other systems reviewed and are negative   The patient has been educated about the nature of the problem(s) and counseled on treatment options.  The patient appeared to understand what I have discussed and is in agreement with it.  Encounter Diagnoses  Name Primary?  . Cervicalgia Yes  . Body mass index 50.0-59.9, adult (HCC)   . Morbid obesity due to excess calories (HCC)   . History of COVID-19    X-rays were done of the cervical spine, reported separately.  PLAN Call if any problems.  Precautions discussed.  Continue current medications. I will begin prednisone dose pack.  Get MRI of cervical spine.  Return to clinic 1 week   Electronically Signed 07-21-1994, MD 6/17/20219:33 AM

## 2019-09-14 ENCOUNTER — Ambulatory Visit: Payer: No Typology Code available for payment source

## 2019-09-16 ENCOUNTER — Encounter: Payer: Self-pay | Admitting: Orthopaedic Surgery

## 2019-09-16 ENCOUNTER — Ambulatory Visit: Payer: No Typology Code available for payment source | Admitting: Orthopaedic Surgery

## 2019-09-16 ENCOUNTER — Other Ambulatory Visit: Payer: Self-pay

## 2019-09-16 DIAGNOSIS — M542 Cervicalgia: Secondary | ICD-10-CM

## 2019-09-16 DIAGNOSIS — Z6841 Body Mass Index (BMI) 40.0 and over, adult: Secondary | ICD-10-CM

## 2019-09-16 NOTE — Progress Notes (Signed)
Patient Whitney Bauer, female DOB:1955/04/11, 64 y.o. ATF:[id number]  Chief Complaint  Patient presents with   Neck Pain    bothers me when I drive for a long period of time    HPI  Whitney Bauer is a 64 y.o. female who continues to have neck pain.  The prednisone helped. She has resumed the diclofenac.  She has MRI scheduled but it is a month away at [facility].  She needs open unit.  She has pain medicine.  She has pain going to the shoulder on the left but not below.  She has no weakness.  I will see her after MRI is done.  If she gets worse, she is to call me.   There is no height or weight on file to calculate BMI.  ROS  Review of Systems  HENT: Negative for congestion.   Respiratory: Negative for cough and shortness of breath.   Cardiovascular: Negative for chest pain and leg swelling.  Endocrine: Positive for cold intolerance.  Musculoskeletal: Positive for arthralgias, gait problem and joint swelling.  Allergic/Immunologic: Positive for environmental allergies.  All other systems reviewed and are negative.   All other systems reviewed and are negative.  The following is a summary of the past history medically, past history surgically, known current medicines, social history and family history.  This information is gathered electronically by the computer from prior information and documentation.  I review this each visit and have found including this information at this point in the chart is beneficial and informative.    Past Medical History:  Diagnosis Date   Anemia    Arthritis    bilat. knees   Back pain    Constipation    Hypertension    Joint pain    Lactose intolerance    Rheumatoid arthritis (HCC)    Swelling of both lower extremities     Past Surgical History:  Procedure Laterality Date   COLONOSCOPY N/A 01/06/2014   Procedure: COLONOSCOPY;  Surgeon: Rogene Houston, MD;  [facility];  Service: Endoscopy;  Laterality: N/A;  43    FOOT SURGERY     PARTIAL HYSTERECTOMY  jan 1999    Family History  Problem Relation Age of Onset   Asthma Mother    Heart disease Mother    Pancreatic cancer Mother    Ovarian cancer Mother    Diabetes Mother    Stroke Mother    Asthma Father    Colon cancer Father    Prostate cancer Father    Stroke Father    High blood pressure Father    Asthma Sister    Cancer Paternal Grandmother        mouth    Social History Social History   Tobacco Use   Smoking status: Never Smoker   Smokeless tobacco: Never Used  Substance Use Topics   Alcohol use: No   Drug use: Not on file    No Known Allergies  Current Outpatient Medications  Medication Sig Dispense Refill   atenolol-chlorthalidone (TENORETIC) 50-25 MG tablet Take 1 tablet by mouth daily.     cyclobenzaprine (FLEXERIL) 10 MG tablet Take 10 mg by mouth as needed for muscle spasms.      diclofenac (VOLTAREN) 75 MG EC tablet TAKE 1 TABLET (75 MG TOTAL) BY MOUTH 2 (TWO) TIMES DAILY WITH A MEAL. 60 tablet 5   loperamide (IMODIUM) 1 MG/5ML solution Take 6 mg by mouth as needed for diarrhea or loose stools (patient  uses at least twice weekly).     pantoprazole (PROTONIX) 40 MG tablet Take 1 tablet (40 mg total) by mouth daily. 90 tablet 3   predniSONE (STERAPRED UNI-PAK 21 TAB) 5 MG (21) TBPK tablet Take 6 pills first day; 5 pills second day; 4 pills third day; 3 pills fourth day; 2 pills next day and 1 pill last day. 21 tablet 0   Vitamin D, Ergocalciferol, (DRISDOL) 1.25 MG (50000 UT) CAPS capsule Take 1 capsule (50,000 Units total) by mouth every 7 (seven) days. 4 capsule 0   No current facility-administered medications for this visit.     Physical Exam  There were no vitals taken for this visit.  Constitutional: overall normal hygiene, normal nutrition, well developed, normal grooming, normal body habitus. Assistive device:cane  Musculoskeletal: gait and station Limp right, muscle tone and  strength are normal, no tremors or atrophy is present.  .  Neurological: coordination overall normal.  Deep tendon reflex/nerve stretch intact.  Sensation normal.  Cranial nerves II-XII intact.   Skin:   Normal overall no scars, lesions, ulcers or rashes. No psoriasis.  Psychiatric: Alert and oriented x 3.  Recent memory intact, remote memory unclear.  Normal mood and affect. Well groomed.  Good eye contact.  Cardiovascular: overall no swelling, no varicosities, no edema bilaterally, normal temperatures of the legs and arms, no clubbing, cyanosis and good capillary refill.  Lymphatic: palpation is normal.  Neck is tender but has good ROM. NV intact.  All other systems reviewed and are negative   The patient has been educated about the nature of the problem(s) and counseled on treatment options.  The patient appeared to understand what I have discussed and is in agreement with it.  Encounter Diagnoses  Name Primary?   Cervicalgia Yes   Body mass index 50.0-59.9, adult (HCC)    Morbid obesity due to excess calories [facility])     PLAN Call if any problems.  Precautions discussed.  Continue current medications.   Return to clinic 1 month   Get MRI at [facility].  If she gets it earlier, call and I will see her earlier.  Electronically Signed Darreld Mclean, MD 6/24/20219:13 AM

## 2019-09-23 ENCOUNTER — Ambulatory Visit: Payer: No Typology Code available for payment source | Admitting: Orthopaedic Surgery

## 2019-10-14 ENCOUNTER — Ambulatory Visit
Admission: RE | Admit: 2019-10-14 | Discharge: 2019-10-14 | Disposition: A | Discharge status: Home / Self Care | Payer: No Typology Code available for payment source | Source: Ambulatory Visit | Attending: Orthopaedic Surgery | Admitting: Orthopaedic Surgery

## 2019-10-14 ENCOUNTER — Other Ambulatory Visit: Payer: Self-pay

## 2019-10-14 DIAGNOSIS — M542 Cervicalgia: Secondary | ICD-10-CM

## 2019-10-14 DIAGNOSIS — M5030 Other cervical disc degeneration, unspecified cervical region: Secondary | ICD-10-CM

## 2019-10-19 ENCOUNTER — Encounter: Payer: Self-pay | Admitting: Orthopaedic Surgery

## 2019-10-19 ENCOUNTER — Ambulatory Visit: Payer: No Typology Code available for payment source | Admitting: Orthopaedic Surgery

## 2019-10-19 ENCOUNTER — Other Ambulatory Visit: Payer: Self-pay

## 2019-10-19 VITALS — BP 120/71 | HR 67

## 2019-10-19 DIAGNOSIS — M542 Cervicalgia: Secondary | ICD-10-CM | POA: Diagnosis not present

## 2019-10-19 NOTE — Patient Instructions (Signed)
Out of work 

## 2019-10-19 NOTE — Addendum Note (Signed)
Addended by: Baird Kay on: 10/19/2019 03:12 PM   Modules accepted: Orders

## 2019-10-19 NOTE — Progress Notes (Signed)
Patient Whitney Bauer, female DOB:04-Oct-1955, 64 y.o. EAV:[id number]  Chief Complaint  Patient presents with   Neck Pain    here to go over MRI/ depends on what I doing as to how it hurts    HPI  Whitney Bauer is a 64 y.o. female who has neck pain and left sided paresthesias.   She had MRI of the cervical spine which showed: IMPRESSION: 1. C3-4 focal advanced left-sided facet osteoarthritis with anterolisthesis. The disc is also degenerated at this level and there is spinal stenosis with mild cord flattening and high-grade left foraminal impingement. 2. C6-7 left foraminal protrusion with prominent impingement. 3. C7-T1 mild to moderate left foraminal narrowing.  I have explained the findings to her.  I will have her see neurosurgeon.  She has seen Dr. Jeral Fruit in the past and would like to see Dr. Danielle Dess who she has seen also.  I will arrange this.  She has the CD of the MRI.  The patient meets the AMA guidelines for Morbid (severe) obesity with a BMI > 40.0 and I have recommended weight loss.  ROS  Review of Systems  All other systems reviewed and are negative.  The following is a summary of the past history medically, past history surgically, known current medicines, social history and family history.  This information is gathered electronically by the computer from prior information and documentation.  I review this each visit and have found including this information at this point in the chart is beneficial and informative.    Past Medical History:  Diagnosis Date   Anemia    Arthritis    bilat. knees   Back pain    Constipation    Hypertension    Joint pain    Lactose intolerance    Rheumatoid arthritis (HCC)    Swelling of both lower extremities     Past Surgical History:  Procedure Laterality Date   COLONOSCOPY N/A 01/06/2014   Procedure: COLONOSCOPY;  Surgeon: Malissa Hippo, MD;  [facility];  Service: Endoscopy;  Laterality: N/A;   830   FOOT SURGERY     PARTIAL HYSTERECTOMY  jan 1999    Family History  Problem Relation Age of Onset   Asthma Mother    Heart disease Mother    Pancreatic cancer Mother    Ovarian cancer Mother    Diabetes Mother    Stroke Mother    Asthma Father    Colon cancer Father    Prostate cancer Father    Stroke Father    High blood pressure Father    Asthma Sister    Cancer Paternal Grandmother        mouth    Social History Social History   Tobacco Use   Smoking status: Never Smoker   Smokeless tobacco: Never Used  Substance Use Topics   Alcohol use: No   Drug use: Not on file    No Known Allergies  Current Outpatient Medications  Medication Sig Dispense Refill   atenolol-chlorthalidone (TENORETIC) 50-25 MG tablet Take 1 tablet by mouth daily.     cyclobenzaprine (FLEXERIL) 10 MG tablet Take 10 mg by mouth as needed for muscle spasms.      diclofenac (VOLTAREN) 75 MG EC tablet TAKE 1 TABLET (75 MG TOTAL) BY MOUTH 2 (TWO) TIMES DAILY WITH A MEAL. 60 tablet 5   loperamide (IMODIUM) 1 MG/5ML solution Take 6 mg by mouth as needed for diarrhea or loose stools (patient uses at least  twice weekly).     pantoprazole (PROTONIX) 40 MG tablet Take 1 tablet (40 mg total) by mouth daily. 90 tablet 3   predniSONE (STERAPRED UNI-PAK 21 TAB) 5 MG (21) TBPK tablet Take 6 pills first day; 5 pills second day; 4 pills third day; 3 pills fourth day; 2 pills next day and 1 pill last day. 21 tablet 0   Vitamin D, Ergocalciferol, (DRISDOL) 1.25 MG (50000 UT) CAPS capsule Take 1 capsule (50,000 Units total) by mouth every 7 (seven) days. (Patient not taking: Reported on 10/19/2019) 4 capsule 0   No current facility-administered medications for this visit.     Physical Exam  Blood pressure 120/71, pulse 67.  Constitutional: overall normal hygiene, normal nutrition, well developed, normal grooming, normal body habitus. Assistive device:cane  Musculoskeletal: gait  and station Limp right, muscle tone and strength are normal, no tremors or atrophy is present.  .  Neurological: coordination overall normal.  Deep tendon reflex/nerve stretch intact.  Sensation normal.  Cranial nerves II-XII intact.   Skin:   Normal overall no scars, lesions, ulcers or rashes. No psoriasis.  Psychiatric: Alert and oriented x 3.  Recent memory intact, remote memory unclear.  Normal mood and affect. Well groomed.  Good eye contact.  Cardiovascular: overall no swelling, no varicosities, no edema bilaterally, normal temperatures of the legs and arms, no clubbing, cyanosis and good capillary refill.  Neck has good ROM but painful.  NV intact.  Lymphatic: palpation is normal.  All other systems reviewed and are negative   The patient has been educated about the nature of the problem(s) and counseled on treatment options.  The patient appeared to understand what I have discussed and is in agreement with it.  Encounter Diagnosis  Name Primary?   Cervicalgia Yes    PLAN Call if any problems.  Precautions discussed.  Continue current medications.   Return to clinic to neurosurgeon    Out of work.  Electronically Signed Darreld Mclean, MD 7/27/20212:58 PM

## 2019-11-04 ENCOUNTER — Telehealth (INDEPENDENT_AMBULATORY_CARE_PROVIDER_SITE_OTHER): Payer: Self-pay | Admitting: *Deleted

## 2019-11-04 ENCOUNTER — Ambulatory Visit (INDEPENDENT_AMBULATORY_CARE_PROVIDER_SITE_OTHER): Payer: No Typology Code available for payment source | Admitting: Gastroenterology

## 2019-11-04 ENCOUNTER — Encounter (INDEPENDENT_AMBULATORY_CARE_PROVIDER_SITE_OTHER): Payer: Self-pay | Admitting: *Deleted

## 2019-11-04 ENCOUNTER — Other Ambulatory Visit: Payer: Self-pay

## 2019-11-04 ENCOUNTER — Encounter (INDEPENDENT_AMBULATORY_CARE_PROVIDER_SITE_OTHER): Payer: Self-pay | Admitting: Gastroenterology

## 2019-11-04 VITALS — BP 117/78 | HR 58 | Temp 98.5°F | Ht 62.0 in | Wt 321.7 lb

## 2019-11-04 DIAGNOSIS — Z800 Family history of malignant neoplasm of digestive organs: Secondary | ICD-10-CM | POA: Diagnosis not present

## 2019-11-04 DIAGNOSIS — R1013 Epigastric pain: Secondary | ICD-10-CM

## 2019-11-04 MED ORDER — PLENVU 140 G PO SOLR: 1.0000 | Freq: Once | ORAL | 0 refills | Status: AC

## 2019-11-04 NOTE — Progress Notes (Signed)
Patient profile: Whitney Bauer is a 64 y.o. female seen for evaluation of abd pain, she had [place] of abd after that OV.     History of Present Illness: Whitney Bauer is seen today for history of EG pain, this began initially about late July. Pain lasted about 2 weeks-described as stabbing pain, Tums helped the pain when had it. Eating made EG pain worse. No nausea/vomiting. No GERD symptoms with the pain. Takes diclofenac w/ food.  Pain lasted for 2 weeks then completely resolved 1 week ago and has not returned.  She states she feels well at this time.  She currently denies any nausea, vomiting, nausea.  Having a BM about q2days. If has constipation drinks milkshake to provoke diarrhea.  Otherwise denies lower abdominal pain, constipation, diarrhea, rectal bleeding.  Was on medrol dose pack for neck pain when EG pain began  Denies NSAID use. Non smoker. Very rare alcohol    Wt Readings from Last 3 Encounters:  11/04/19 (!) 321 lb 11.2 oz (145.9 kg)  09/09/19 (!) 317 lb (143.8 kg)  06/03/19 (!) 324 lb (147 kg)     Last Colonoscopy: Findings: 2015  Prep excellent. Single small AV malformation noted at cecum. Scattered diverticula throughout the colon but mainly at ascending and sigmoid colon. Normal rectal mucosa and anorectal junction.  5 year repeat given father w/ hx colon cancer diagnosis early 55s   Last Endoscopy: none prior    Past Medical History:  Past Medical History:  Diagnosis Date  . Anemia   . Arthritis    bilat. knees  . Back pain   . Constipation   . Hypertension   . Joint pain   . Lactose intolerance   . Rheumatoid arthritis (HCC)   . Swelling of both lower extremities     Problem List: Patient Active Problem List   Diagnosis Date Noted  . Vitamin D deficiency 05/06/2018  . Gastroesophageal reflux disease 04/13/2018  . Essential hypertension 04/13/2018  . Class 3 severe obesity with serious comorbidity and body mass index (BMI) of 50.0 to 59.9 in  adult [facility]) 04/13/2018  . Derangement of posterior horn of medial meniscus of right knee 09/10/2016  . Chronic pain of right knee 09/10/2016  . DOE (dyspnea on exertion) 01/28/2011  . Muscle weakness (generalized) 10/23/2010    Past Surgical History: Past Surgical History:  Procedure Laterality Date  . COLONOSCOPY N/A 01/06/2014   Procedure: COLONOSCOPY;  Surgeon: Malissa Hippo, MD;  [facility];  Service: Endoscopy;  Laterality: N/A;  830  . FOOT SURGERY    . PARTIAL HYSTERECTOMY  jan 1999    Allergies: No Known Allergies    Home Medications:  Current Outpatient Medications:  .  atenolol-chlorthalidone (TENORETIC) 50-25 MG tablet, Take 1 tablet by mouth daily., Disp: , Rfl:  .  cyclobenzaprine (FLEXERIL) 10 MG tablet, Take 10 mg by mouth as needed for muscle spasms. , Disp: , Rfl:  .  diclofenac (VOLTAREN) 75 MG EC tablet, TAKE 1 TABLET (75 MG TOTAL) BY MOUTH 2 (TWO) TIMES DAILY WITH A MEAL., Disp: 60 tablet, Rfl: 5 .  loperamide (IMODIUM) 1 MG/5ML solution, Take 6 mg by mouth as needed for diarrhea or loose stools (patient uses at least twice weekly)., Disp: , Rfl:  .  OXYCODONE HCL PO, Take by mouth. Patient will takes 1 by mouth every 12 hours., Disp: , Rfl:  .  pantoprazole (PROTONIX) 40 MG tablet, Take 1 tablet (40 mg total) by  mouth daily. (Patient not taking: Reported on 11/04/2019), Disp: 90 tablet, Rfl: 3   Family History: family history includes Asthma in her father, mother, and sister; Cancer in her paternal grandmother; Colon cancer in her father; Diabetes in her mother; Heart disease in her mother; High blood pressure in her father; Ovarian cancer in her mother; Pancreatic cancer in her mother; Prostate cancer in her father; Stroke in her father and mother.    Social History:   reports that she has never smoked. She has never used smokeless tobacco. She reports that she does not drink alcohol and does not use drugs.   Review of Systems: Constitutional:  Denies weight loss/weight gain  Eyes: No changes in vision. ENT: No oral lesions, sore throat.  GI: see HPI.  Heme/Lymph: No easy bruising.  CV: No chest pain.  GU: No hematuria.  Integumentary: No rashes.  Neuro: No headaches.  Psych: No depression/anxiety.  Endocrine: No heat/cold intolerance.  Allergic/Immunologic: No urticaria.  Resp: No cough, SOB.  Musculoskeletal: No joint swelling.    Physical Examination: BP 117/78 (BP Location: Right Arm, Patient Position: Sitting, Cuff Size: Large)   Pulse (!) 58   Temp 98.5 F (36.9 C) (Oral)   Ht 5\' 2"  (1.575 m)   Wt (!) 321 lb 11.2 oz (145.9 kg)   BMI 58.84 kg/m  Gen: NAD, alert and oriented x 4 HEENT: PEERLA, EOMI, Neck: supple, no JVD Chest: CTA bilaterally, no wheezes, crackles, or other adventitious sounds CV: RRR, no m/g/c/r Abd: soft, NT, ND, +BS in all four quadrants; no HSM, guarding, ridigity, or rebound tenderness Ext: no edema, well perfused with 2+ pulses, Skin: no rash or lesions noted on observed skin Lymph: no noted LAD  Data Reviewed:  IMPRESSION: 2019 Increased hepatic echotexture compatible with fatty infiltrative change. Normal appearance of the gallbladder. If there are clinical concerns of chronic gallbladder dysfunction, a nuclear medicine hepatobiliary scan with gallbladder ejection fraction determination may be useful. No acute intra-abdominal abnormality is observed.    Assessment/Plan: Whitney Bauer is a 64 y.o. female seen for follow up.   1.  Epigastric pain-patient reports about 2 weeks of significant pain that has since resolved.  Suspicious for gastritis or peptic ulcer disease.  She reports pain has completely resolved for the past week. Given pain has resolved will hold off on PPI treatment at this time. We will add on endoscopy to her colonoscopy for evaluation.  Reviewed can take diclofenac with food to decrease risk of GI irritation   2.  Family history of colon cancer-father, last  colonoscopy 2015. No lower GI symptoms. Due for repeat this time. Given BMI we will plan for propofol sedation.   Whitney Bauer was seen today for follow-up.  Diagnoses and all orders for this visit:  Epigastric pain  Family history of colon cancer      I personally performed the service, non-incident to. (WP)  2016, [facility]

## 2019-11-04 NOTE — Telephone Encounter (Signed)
Patient needs Plenvu (copay card) ° °

## 2019-11-04 NOTE — Patient Instructions (Signed)
Please call me if upper abd pain returns  We are scheduling colonoscopy/endoscopy.

## 2019-11-07 ENCOUNTER — Other Ambulatory Visit: Payer: Self-pay | Admitting: Orthopaedic Surgery

## 2019-11-30 ENCOUNTER — Ambulatory Visit (INDEPENDENT_AMBULATORY_CARE_PROVIDER_SITE_OTHER): Payer: No Typology Code available for payment source | Admitting: Orthopaedic Surgery

## 2019-11-30 ENCOUNTER — Encounter: Payer: Self-pay | Admitting: Orthopaedic Surgery

## 2019-11-30 ENCOUNTER — Ambulatory Visit: Payer: No Typology Code available for payment source

## 2019-11-30 ENCOUNTER — Other Ambulatory Visit: Payer: Self-pay

## 2019-11-30 DIAGNOSIS — M25562 Pain in left knee: Secondary | ICD-10-CM

## 2019-11-30 DIAGNOSIS — G8929 Other chronic pain: Secondary | ICD-10-CM | POA: Diagnosis not present

## 2019-11-30 DIAGNOSIS — M170 Bilateral primary osteoarthritis of knee: Secondary | ICD-10-CM

## 2019-11-30 DIAGNOSIS — M25561 Pain in right knee: Secondary | ICD-10-CM

## 2019-11-30 NOTE — Progress Notes (Signed)
PROCEDURE NOTE:  The patient requests injections of the left knee , verbal consent was obtained.  The left knee was prepped appropriately after time out was performed.   Sterile technique was observed and injection of 1 cc of Depo-Medrol 40 mg with several cc's of plain xylocaine. Anesthesia was provided by ethyl chloride and a 20-gauge needle was used to inject the knee area. The injection was tolerated well.  A band aid dressing was applied.  The patient was advised to apply ice later today and tomorrow to the injection sight as needed.  X-rays were done of both knees, reported separately.  The DJD is far worse on the left knee.  I will get MRI of the left knee.  Return in two weeks.  Call if any problem.  Precautions discussed.   Electronically Signed Darreld Mclean, MD 9/7/20212:16 PM

## 2019-11-30 NOTE — Patient Instructions (Addendum)
Ok to use [employer] patches for up to 12 hours at a time You can try the one with lidocaine, it may help more. You can cut the patch to size if needed  MRI scan has been ordered for you, we will get approval for the scan if needed, then [facility] will call you with appointment.

## 2019-12-07 ENCOUNTER — Other Ambulatory Visit (INDEPENDENT_AMBULATORY_CARE_PROVIDER_SITE_OTHER): Payer: Self-pay | Admitting: *Deleted

## 2019-12-14 ENCOUNTER — Ambulatory Visit: Payer: No Typology Code available for payment source | Admitting: Orthopaedic Surgery

## 2019-12-14 NOTE — Patient Instructions (Signed)
Whitney Bauer  12/14/2019     @PREFPERIOPPHARMACY @   Your procedure is scheduled on  12/22/2019.  Report to 12/24/2019 at  0800  A.M.  Call this number if you have problems the morning of surgery:  [phone]   Remember:  Follow the diet and prep instructions given to you by the office.                       Take these medicines the morning of surgery with A SIP OF WATER  Atenolol, flexeril(if needed), nexium.     Do not wear jewelry, make-up or nail polish.  Do not wear lotions, powders, or perfumes. Please wear deodorant and brush your teeth.  Do not shave 48 hours prior to surgery.  Men may shave face and neck.  Do not bring valuables to the hospital.  [facility] is not responsible for any belongings or valuables.  Contacts, dentures or bridgework may not be worn into surgery.  Leave your suitcase in the car.  After surgery it may be brought to your room.  For patients admitted to the hospital, discharge time will be determined by your treatment team.  Patients discharged the day of surgery will not be allowed to drive home.   Name and phone number of your driver:   family Special instructions:  DO NOT smoke the morning of your procedure.  Please read over the following fact sheets that you were given. Anesthesia Post-op Instructions and Care and Recovery After Surgery       Upper Endoscopy, Adult, Care After This sheet gives you information about how to care for yourself after your procedure. Your health care provider may also give you more specific instructions. If you have problems or questions, contact your health care provider. What can I expect after the procedure? After the procedure, it is common to have:  A sore throat.  Mild stomach pain or discomfort.  Bloating.  Nausea. Follow these instructions at home:   Follow instructions from your health care provider about what to eat or drink after your procedure.  Return to your normal  activities as told by your health care provider. Ask your health care provider what activities are safe for you.  Take over-the-counter and prescription medicines only as told by your health care provider.  Do not drive for 24 hours if you were given a sedative during your procedure.  Keep all follow-up visits as told by your health care provider. This is important. Contact a health care provider if you have:  A sore throat that lasts longer than one day.  Trouble swallowing. Get help right away if:  You vomit blood or your vomit looks like coffee grounds.  You have: ? A fever. ? Bloody, black, or tarry stools. ? A severe sore throat or you cannot swallow. ? Difficulty breathing. ? Severe pain in your chest or abdomen. Summary  After the procedure, it is common to have a sore throat, mild stomach discomfort, bloating, and nausea.  Do not drive for 24 hours if you were given a sedative during the procedure.  Follow instructions from your health care provider about what to eat or drink after your procedure.  Return to your normal activities as told by your health care provider. This information is not intended to replace advice given to you by your health care provider. Make sure you discuss any questions you have with your health  care provider. Document Revised: 09/02/2017 Document Reviewed: 08/11/2017 Elsevier Patient Education  [place].  Colonoscopy, Adult, Care After This sheet gives you information about how to care for yourself after your procedure. Your health care provider may also give you more specific instructions. If you have problems or questions, contact your health care provider. What can I expect after the procedure? After the procedure, it is common to have:  A small amount of blood in your stool for 24 hours after the procedure.  Some gas.  Mild cramping or bloating of your abdomen. Follow these instructions at home: Eating and  drinking   Drink enough fluid to keep your urine pale yellow.  Follow instructions from your health care provider about eating or drinking restrictions.  Resume your normal diet as instructed by your health care provider. Avoid heavy or fried foods that are hard to digest. Activity  Rest as told by your health care provider.  Avoid sitting for a long time without moving. Get up to take short walks every 1-2 hours. This is important to improve blood flow and breathing. Ask for help if you feel weak or unsteady.  Return to your normal activities as told by your health care provider. Ask your health care provider what activities are safe for you. Managing cramping and bloating   Try walking around when you have cramps or feel bloated.  Apply heat to your abdomen as told by your health care provider. Use the heat source that your health care provider recommends, such as a moist heat pack or a heating pad. ? Place a towel between your skin and the heat source. ? Leave the heat on for 20-30 minutes. ? Remove the heat if your skin turns bright red. This is especially important if you are unable to feel pain, heat, or cold. You may have a greater risk of getting burned. General instructions  For the first 24 hours after the procedure: ? Do not drive or use machinery. ? Do not sign important documents. ? Do not drink alcohol. ? Do your regular daily activities at a slower pace than normal. ? Eat soft foods that are easy to digest.  Take over-the-counter and prescription medicines only as told by your health care provider.  Keep all follow-up visits as told by your health care provider. This is important. Contact a health care provider if:  You have blood in your stool 2-3 days after the procedure. Get help right away if you have:  More than a small spotting of blood in your stool.  Large blood clots in your stool.  Swelling of your abdomen.  Nausea or vomiting.  A  fever.  Increasing pain in your abdomen that is not relieved with medicine. Summary  After the procedure, it is common to have a small amount of blood in your stool. You may also have mild cramping and bloating of your abdomen.  For the first 24 hours after the procedure, do not drive or use machinery, sign important documents, or drink alcohol.  Get help right away if you have a lot of blood in your stool, nausea or vomiting, a fever, or increased pain in your abdomen. This information is not intended to replace advice given to you by your health care provider. Make sure you discuss any questions you have with your health care provider. Document Revised: 10/05/2018 Document Reviewed: 10/05/2018 Elsevier Patient Education  [place] After These instructions provide you with information about  caring for yourself after your procedure. Your health care provider may also give you more specific instructions. Your treatment has been planned according to current medical practices, but problems sometimes occur. Call your health care provider if you have any problems or questions after your procedure. What can I expect after the procedure? After your procedure, you may:  Feel sleepy for several hours.  Feel clumsy and have poor balance for several hours.  Feel forgetful about what happened after the procedure.  Have poor judgment for several hours.  Feel nauseous or vomit.  Have a sore throat if you had a breathing tube during the procedure. Follow these instructions at home: For at least 24 hours after the procedure:      Have a responsible adult stay with you. It is important to have someone help care for you until you are awake and alert.  Rest as needed.  Do not: ? Participate in activities in which you could fall or become injured. ? Drive. ? Use heavy machinery. ? Drink alcohol. ? Take sleeping pills or medicines that cause  drowsiness. ? Make important decisions or sign legal documents. ? Take care of children on your own. Eating and drinking  Follow the diet that is recommended by your health care provider.  If you vomit, drink water, juice, or soup when you can drink without vomiting.  Make sure you have little or no nausea before eating solid foods. General instructions  Take over-the-counter and prescription medicines only as told by your health care provider.  If you have sleep apnea, surgery and certain medicines can increase your risk for breathing problems. Follow instructions from your health care provider about wearing your sleep device: ? Anytime you are sleeping, including during daytime naps. ? While taking prescription pain medicines, sleeping medicines, or medicines that make you drowsy.  If you smoke, do not smoke without supervision.  Keep all follow-up visits as told by your health care provider. This is important. Contact a health care provider if:  You keep feeling nauseous or you keep vomiting.  You feel light-headed.  You develop a rash.  You have a fever. Get help right away if:  You have trouble breathing. Summary  For several hours after your procedure, you may feel sleepy and have poor judgment.  Have a responsible adult stay with you for at least 24 hours or until you are awake and alert. This information is not intended to replace advice given to you by your health care provider. Make sure you discuss any questions you have with your health care provider. Document Revised: 06/09/2017 Document Reviewed: 07/02/2015 Elsevier Patient Education  [place].

## 2019-12-14 NOTE — Pre-Procedure Instructions (Signed)
Left patient a message to stop taking her phentermine until after procedure and instructed her to call back if needed.

## 2019-12-15 ENCOUNTER — Other Ambulatory Visit: Payer: Self-pay

## 2019-12-15 ENCOUNTER — Ambulatory Visit (HOSPITAL_COMMUNITY): Payer: No Typology Code available for payment source

## 2019-12-15 ENCOUNTER — Ambulatory Visit (HOSPITAL_COMMUNITY)
Admission: RE | Admit: 2019-12-15 | Discharge: 2019-12-15 | Disposition: A | Discharge status: Home / Self Care | Payer: No Typology Code available for payment source | Source: Ambulatory Visit | Attending: Orthopaedic Surgery | Admitting: Orthopaedic Surgery

## 2019-12-15 DIAGNOSIS — G8929 Other chronic pain: Secondary | ICD-10-CM | POA: Diagnosis present

## 2019-12-15 DIAGNOSIS — M25562 Pain in left knee: Secondary | ICD-10-CM | POA: Insufficient documentation

## 2019-12-16 ENCOUNTER — Encounter: Payer: Self-pay | Admitting: Orthopaedic Surgery

## 2019-12-16 ENCOUNTER — Ambulatory Visit: Payer: No Typology Code available for payment source | Admitting: Orthopaedic Surgery

## 2019-12-16 VITALS — Ht 62.0 in | Wt 321.0 lb

## 2019-12-16 DIAGNOSIS — M25562 Pain in left knee: Secondary | ICD-10-CM

## 2019-12-16 DIAGNOSIS — G8929 Other chronic pain: Secondary | ICD-10-CM | POA: Diagnosis not present

## 2019-12-16 DIAGNOSIS — Z6841 Body Mass Index (BMI) 40.0 and over, adult: Secondary | ICD-10-CM | POA: Diagnosis not present

## 2019-12-16 NOTE — Progress Notes (Signed)
What did the MRI show?  She has pain in the left knee.  She had MRI of left knee which showed: IMPRESSION: 1. Degenerated and torn medial and lateral menisci. 2. 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 3. Severe tricompartmental degenerative changes. 4. Moderate-sized joint effusion with fairly significant synovitis. This is relatively low T2 signal intensity and PVNS would be a consideration. 5. Mild lateral tilt and orientation of the patella in relation to the femoral trochlear groove likely due to patient's body habitus and increased Q angle. 6. Limited examination as detailed above.  I have explained the findings to her.  I have gone over each number above and explained the findings in detail.  I have independently reviewed the MRI.     I spent over 30 minutes with her discussing her options.  She is overweight. BMI is 58.  She is using a cane.  She has a high level of pain and does not complain much.  I have known her over the years and she rarely complains of pain.  She is complaining now and says she can hardly get around now.  She is redesigning her home to accommodate handicap provisions.  She lives alone but gets help.  She needs some pain relief.  She knows her weight is a problem. She was set up for [facility] program before COVID hit but that disrupted her going.  I will have her seen at [facility] for further evaluation and see if she is a candidate for arthroscopy.  She is agreeable to this.  Return in six weeks.  Call if any problem.  Precautions discussed.   Electronically Signed Darreld Mclean, MD 9/23/202112:10 PM

## 2019-12-20 ENCOUNTER — Other Ambulatory Visit: Payer: Self-pay

## 2019-12-20 ENCOUNTER — Encounter (HOSPITAL_COMMUNITY): Payer: Self-pay

## 2019-12-20 ENCOUNTER — Encounter (HOSPITAL_COMMUNITY)
Admission: RE | Admit: 2019-12-20 | Discharge: 2019-12-20 | Disposition: A | Discharge status: Home / Self Care | Payer: No Typology Code available for payment source | Source: Ambulatory Visit | Attending: Internal Medicine | Admitting: Internal Medicine

## 2019-12-20 ENCOUNTER — Other Ambulatory Visit (HOSPITAL_COMMUNITY)
Admission: RE | Admit: 2019-12-20 | Discharge: 2019-12-20 | Disposition: A | Discharge status: Home / Self Care | Payer: No Typology Code available for payment source | Source: Ambulatory Visit | Attending: Internal Medicine | Admitting: Internal Medicine

## 2019-12-20 DIAGNOSIS — Z20822 Contact with and (suspected) exposure to covid-19: Secondary | ICD-10-CM | POA: Diagnosis not present

## 2019-12-20 DIAGNOSIS — Z01818 Encounter for other preprocedural examination: Secondary | ICD-10-CM | POA: Diagnosis present

## 2019-12-20 HISTORY — DX: Gastro-esophageal reflux disease without esophagitis: K21.9

## 2019-12-20 HISTORY — DX: Family history of other specified conditions: Z84.89

## 2019-12-20 LAB — CBC WITH DIFFERENTIAL/PLATELET
Abs Immature Granulocytes: 0.02 10*3/uL (ref 0.00–0.07)
Basophils Absolute: 0 10*3/uL (ref 0.0–0.1)
Basophils Relative: 1 %
Eosinophils Absolute: 0.2 10*3/uL (ref 0.0–0.5)
Eosinophils Relative: 5 %
HCT: 37.6 % (ref 36.0–46.0)
Hemoglobin: 12.2 g/dL (ref 12.0–15.0)
Immature Granulocytes: 0 %
Lymphocytes Relative: 40 %
Lymphs Abs: 1.8 10*3/uL (ref 0.7–4.0)
MCH: 29.3 pg (ref 26.0–34.0)
MCHC: 32.4 g/dL (ref 30.0–36.0)
MCV: 90.4 fL (ref 80.0–100.0)
Monocytes Absolute: 0.5 10*3/uL (ref 0.1–1.0)
Monocytes Relative: 10 %
Neutro Abs: 2 10*3/uL (ref 1.7–7.7)
Neutrophils Relative %: 44 %
Platelets: 216 10*3/uL (ref 150–400)
RBC: 4.16 MIL/uL (ref 3.87–5.11)
RDW: 13.5 % (ref 11.5–15.5)
WBC: 4.5 10*3/uL (ref 4.0–10.5)
nRBC: 0 % (ref 0.0–0.2)

## 2019-12-20 LAB — BASIC METABOLIC PANEL
Anion gap: 8 (ref 5–15)
BUN: 21 mg/dL (ref 8–23)
CO2: 28 mmol/L (ref 22–32)
Calcium: 9.4 mg/dL (ref 8.9–10.3)
Chloride: 103 mmol/L (ref 98–111)
Creatinine, Ser: 0.84 mg/dL (ref 0.44–1.00)
GFR calc Af Amer: >60 mL/min (ref >60)
GFR calc non Af Amer: >60 mL/min (ref >60)
Glucose, Bld: 106 mg/dL — ABNORMAL HIGH (ref 70–99)
Potassium: 3.4 mmol/L — ABNORMAL LOW (ref 3.5–5.1)
Sodium: 139 mmol/L (ref 135–145)

## 2019-12-20 LAB — MAGNESIUM: Magnesium: 2.1 mg/dL (ref 1.7–2.4)

## 2019-12-21 LAB — SARS CORONAVIRUS 2 (TAT 6-24 HRS): SARS Coronavirus 2: NEGATIVE

## 2019-12-22 ENCOUNTER — Ambulatory Visit (HOSPITAL_COMMUNITY): Payer: No Typology Code available for payment source | Admitting: Anesthesiology

## 2019-12-22 ENCOUNTER — Encounter (HOSPITAL_COMMUNITY)
Admission: RE | Disposition: A | Discharge status: Home / Self Care | Payer: Self-pay | Source: Home / Self Care | Attending: Internal Medicine

## 2019-12-22 ENCOUNTER — Ambulatory Visit (HOSPITAL_COMMUNITY)
Admission: RE | Admit: 2019-12-22 | Discharge: 2019-12-22 | Disposition: A | Discharge status: Home / Self Care | Payer: No Typology Code available for payment source | Attending: Internal Medicine | Admitting: Internal Medicine

## 2019-12-22 ENCOUNTER — Encounter (HOSPITAL_COMMUNITY): Payer: Self-pay | Admitting: Internal Medicine

## 2019-12-22 DIAGNOSIS — Z800 Family history of malignant neoplasm of digestive organs: Secondary | ICD-10-CM | POA: Diagnosis not present

## 2019-12-22 DIAGNOSIS — K2970 Gastritis, unspecified, without bleeding: Secondary | ICD-10-CM

## 2019-12-22 DIAGNOSIS — M069 Rheumatoid arthritis, unspecified: Secondary | ICD-10-CM | POA: Insufficient documentation

## 2019-12-22 DIAGNOSIS — I10 Essential (primary) hypertension: Secondary | ICD-10-CM | POA: Insufficient documentation

## 2019-12-22 DIAGNOSIS — K219 Gastro-esophageal reflux disease without esophagitis: Secondary | ICD-10-CM | POA: Insufficient documentation

## 2019-12-22 DIAGNOSIS — Z1211 Encounter for screening for malignant neoplasm of colon: Secondary | ICD-10-CM | POA: Insufficient documentation

## 2019-12-22 DIAGNOSIS — Z7901 Long term (current) use of anticoagulants: Secondary | ICD-10-CM | POA: Diagnosis not present

## 2019-12-22 DIAGNOSIS — G8929 Other chronic pain: Secondary | ICD-10-CM | POA: Diagnosis not present

## 2019-12-22 DIAGNOSIS — K5730 Diverticulosis of large intestine without perforation or abscess without bleeding: Secondary | ICD-10-CM | POA: Insufficient documentation

## 2019-12-22 DIAGNOSIS — K644 Residual hemorrhoidal skin tags: Secondary | ICD-10-CM | POA: Insufficient documentation

## 2019-12-22 DIAGNOSIS — Z8249 Family history of ischemic heart disease and other diseases of the circulatory system: Secondary | ICD-10-CM | POA: Insufficient documentation

## 2019-12-22 DIAGNOSIS — K449 Diaphragmatic hernia without obstruction or gangrene: Secondary | ICD-10-CM | POA: Insufficient documentation

## 2019-12-22 DIAGNOSIS — Z79899 Other long term (current) drug therapy: Secondary | ICD-10-CM | POA: Diagnosis not present

## 2019-12-22 DIAGNOSIS — Z791 Long term (current) use of non-steroidal anti-inflammatories (NSAID): Secondary | ICD-10-CM | POA: Diagnosis not present

## 2019-12-22 DIAGNOSIS — R1013 Epigastric pain: Secondary | ICD-10-CM

## 2019-12-22 DIAGNOSIS — K222 Esophageal obstruction: Secondary | ICD-10-CM | POA: Insufficient documentation

## 2019-12-22 HISTORY — PX: COLONOSCOPY WITH PROPOFOL: SHX5780

## 2019-12-22 HISTORY — PX: ESOPHAGOGASTRODUODENOSCOPY (EGD) WITH PROPOFOL: SHX5813

## 2019-12-22 HISTORY — PX: BIOPSY: SHX5522

## 2019-12-22 LAB — HM COLONOSCOPY

## 2019-12-22 SURGERY — COLONOSCOPY WITH PROPOFOL
Anesthesia: General

## 2019-12-22 MED ORDER — LIDOCAINE VISCOUS HCL 2 % MT SOLN
15.0000 mL | Freq: Once | OROMUCOSAL | Status: AC
  Administered 2019-12-22: 15 mL via OROMUCOSAL

## 2019-12-22 MED ORDER — LIDOCAINE VISCOUS HCL 2 % MT SOLN
OROMUCOSAL | Status: AC
  Filled 2019-12-22: qty 15

## 2019-12-22 MED ORDER — PROPOFOL 10 MG/ML IV BOLUS
INTRAVENOUS | Status: DC | PRN
  Administered 2019-12-22: 20 mg via INTRAVENOUS
  Administered 2019-12-22: 30 mg via INTRAVENOUS
  Administered 2019-12-22: 60 mg via INTRAVENOUS
  Administered 2019-12-22: 30 mg via INTRAVENOUS
  Administered 2019-12-22 (×3): 20 mg via INTRAVENOUS
  Administered 2019-12-22: 40 mg via INTRAVENOUS

## 2019-12-22 MED ORDER — STERILE WATER FOR IRRIGATION IR SOLN
Status: DC | PRN
  Administered 2019-12-22: 2.5 mL

## 2019-12-22 MED ORDER — PROPOFOL 500 MG/50ML IV EMUL
INTRAVENOUS | Status: DC | PRN
  Administered 2019-12-22: 200 ug/kg/min via INTRAVENOUS

## 2019-12-22 MED ORDER — LIDOCAINE HCL (PF) 2 % IJ SOLN
INTRAMUSCULAR | Status: DC | PRN
  Administered 2019-12-22: 50 mg via INTRADERMAL

## 2019-12-22 MED ORDER — PROPOFOL 10 MG/ML IV BOLUS
INTRAVENOUS | Status: AC
  Filled 2019-12-22: qty 40

## 2019-12-22 MED ORDER — LACTATED RINGERS IV SOLN: Freq: Once | INTRAVENOUS | Status: AC

## 2019-12-22 MED ORDER — PHENYLEPHRINE HCL (PRESSORS) 10 MG/ML IV SOLN
INTRAVENOUS | Status: DC | PRN
  Administered 2019-12-22: 300 ug via INTRAVENOUS
  Administered 2019-12-22: 200 ug via INTRAVENOUS
  Administered 2019-12-22: 100 ug via INTRAVENOUS

## 2019-12-22 MED ORDER — LACTATED RINGERS IV SOLN: INTRAVENOUS | Status: DC | PRN

## 2019-12-22 MED ORDER — GLYCOPYRROLATE 0.2 MG/ML IJ SOLN
INTRAMUSCULAR | Status: AC
  Filled 2019-12-22: qty 1

## 2019-12-22 MED ORDER — GLYCOPYRROLATE 0.2 MG/ML IJ SOLN
0.2000 mg | Freq: Once | INTRAMUSCULAR | Status: AC
  Administered 2019-12-22: 0.2 mg via INTRAVENOUS

## 2019-12-22 MED ORDER — PROPOFOL 500 MG/50ML IV EMUL: INTRAVENOUS | Status: DC | PRN

## 2019-12-22 NOTE — Anesthesia Postprocedure Evaluation (Signed)
Anesthesia Post Note  Patient: Whitney Bauer  Procedure(s) Performed: COLONOSCOPY WITH PROPOFOL (N/A ) ESOPHAGOGASTRODUODENOSCOPY (EGD) WITH PROPOFOL (N/A ) BIOPSY  Patient location during evaluation: PACU Anesthesia Type: General Level of consciousness: awake and alert and oriented Pain management: pain level controlled Vital Signs Assessment: post-procedure vital signs reviewed and stable Respiratory status: spontaneous breathing and respiratory function stable Cardiovascular status: blood pressure returned to baseline and stable Postop Assessment: no apparent nausea or vomiting Anesthetic complications: no   No complications documented.   Last Vitals:  Vitals:   12/22/19 1015 12/22/19 1021  BP: 118/69 128/83  Pulse: 71 60  Resp: 20 20  Temp:  36.5 C  SpO2: 100% 100%    Last Pain:  Vitals:   12/22/19 1021  TempSrc: Oral  PainSc: 0-No pain                 [name] C Grayden Burley

## 2019-12-22 NOTE — Op Note (Signed)
[facility] Patient Name: Whitney Bauer Procedure Date: 12/22/2019 9:02 AM MRN: [id number] Date of Birth: 04-11-1955 Attending MD: Lionel December , MD CSN: [id number] Age: 64 Admit Type: Outpatient Procedure:                Upper GI endoscopy Indications:              Epigastric abdominal pain Providers:                Lionel December, MD, Nena Polio, RN, Pandora Leiter,                            Technician, Kristine L. Jessee Avers, Technician Referring MD:             Carylon Perches, MD Medicines:                Propofol per Anesthesia Complications:            No immediate complications. Estimated Blood Loss:     Estimated blood loss was minimal. Procedure:                Pre-Anesthesia Assessment:                           - Prior to the procedure, a History and Physical                            was performed, and patient medications and                            allergies were reviewed. The patient's tolerance of                            previous anesthesia was also reviewed. The risks                            and benefits of the procedure and the sedation                            options and risks were discussed with the patient.                            All questions were answered, and informed consent                            was obtained. Prior Anticoagulants: The patient has                            taken no previous anticoagulant or antiplatelet                            agents except for NSAID medication. ASA Grade                            Assessment: III - A patient with severe systemic  disease. After reviewing the risks and benefits,                            the patient was deemed in satisfactory condition to                            undergo the procedure.                           After obtaining informed consent, the endoscope was                            passed under direct vision. Throughout the                             procedure, the patient's blood pressure, pulse, and                            oxygen saturations were monitored continuously. The                            [id number]-[id number] ([id number]) scope was introduced through the                            mouth, and advanced to the second part of duodenum.                            The upper GI endoscopy was accomplished without                            difficulty. The patient tolerated the procedure                            well. Scope In: 9:17:25 AM Scope Out: 9:26:22 AM Total Procedure Duration: 0 hours 8 minutes 57 seconds  Findings:      The hypopharynx was normal.      The examined esophagus was normal.      A widely patent Schatzki ring was found at the gastroesophageal junction.      A 3 cm hiatal hernia was present.      Patchy mild inflammation characterized by congestion (edema) and       erythema was found in the gastric antrum and in the prepyloric region of       the stomach. Biopsies were taken with a cold forceps for histology.      The exam of the stomach was otherwise normal.      The duodenal bulb and second portion of the duodenum were normal. Impression:               - Normal hypopharynx.                           - Normal esophagus.                           - Widely patent Schatzki ring.                           -  3 cm hiatal hernia.                           - Gastritis. Biopsied.                           - Normal duodenal bulb and second portion of the                            duodenum. Moderate Sedation:      Per Anesthesia Care Recommendation:           - Patient has a contact number available for                            emergencies. The signs and symptoms of potential                            delayed complications were discussed with the                            patient. Return to normal activities tomorrow.                            Written discharge instructions were provided to the                             patient.                           - Resume previous diet today.                           - Continue present medications.                           - No aspirin, ibuprofen, naproxen, or other                            non-steroidal anti-inflammatory drugs for 1 day.                           - Await pathology results. Procedure Code(s):        --- Professional ---                           [phone], Esophagogastroduodenoscopy, flexible,                            transoral; with biopsy, single or multiple Diagnosis Code(s):        --- Professional ---                           K22.2, Esophageal obstruction                           K44.9, Diaphragmatic hernia without obstruction or  gangrene                           K29.70, Gastritis, unspecified, without bleeding                           R10.13, Epigastric pain CPT copyright 2019 American Medical Association. All rights reserved. The codes documented in this report are preliminary and upon coder review may  be revised to meet current compliance requirements. Lionel December, MD Lionel December, MD 12/22/2019 9:55:39 AM This report has been signed electronically. Number of Addenda: 0

## 2019-12-22 NOTE — Op Note (Signed)
[facility] Patient Name: Whitney Bauer Procedure Date: 12/22/2019 9:31 AM MRN: [id number] Date of Birth: 02-22-1956 Attending MD: Lionel December , MD CSN: [id number] Age: 64 Admit Type: Outpatient Procedure:                Colonoscopy Indications:              Screening for colorectal malignant neoplasm,                            Screening in patient at increased risk: Colorectal                            cancer in father 55 or older Providers:                Lionel December, MD, Nena Polio, RN, Pandora Leiter,                            Technician, Kristine L. Jessee Avers, Technician Referring MD:             Carylon Perches, MD Medicines:                Propofol per Anesthesia Complications:            No immediate complications. Estimated Blood Loss:     Estimated blood loss: none. Procedure:                Pre-Anesthesia Assessment:                           - Prior to the procedure, a History and Physical                            was performed, and patient medications and                            allergies were reviewed. The patient's tolerance of                            previous anesthesia was also reviewed. The risks                            and benefits of the procedure and the sedation                            options and risks were discussed with the patient.                            All questions were answered, and informed consent                            was obtained. Prior Anticoagulants: The patient has                            taken no previous anticoagulant or antiplatelet  agents except for NSAID medication. ASA Grade                            Assessment: III - A patient with severe systemic                            disease. After reviewing the risks and benefits,                            the patient was deemed in satisfactory condition to                            undergo the procedure.                           After  obtaining informed consent, the colonoscope                            was passed under direct vision. Throughout the                            procedure, the patient's blood pressure, pulse, and                            oxygen saturations were monitored continuously. The                            [id number]-HQ190L ([id number]) scope was introduced through                            the anus and advanced to the the cecum, identified                            by appendiceal orifice and ileocecal valve. The                            colonoscopy was performed without difficulty. The                            patient tolerated the procedure well. The quality                            of the bowel preparation was good. Scope In: 9:33:58 AM Scope Out: 9:47:09 AM Scope Withdrawal Time: 0 hours 7 minutes 59 seconds  Total Procedure Duration: 0 hours 13 minutes 11 seconds  Findings:      The perianal and digital rectal examinations were normal.      Multiple diverticula were found in the entire colon.      The exam was otherwise normal throughout the examined colon.      External hemorrhoids were found during retroflexion. The hemorrhoids       were small. Impression:               - Diverticulosis in the entire examined colon.                           -  External hemorrhoids.                           - No specimens collected. Moderate Sedation:      Per Anesthesia Care Recommendation:           - Patient has a contact number available for                            emergencies. The signs and symptoms of potential                            delayed complications were discussed with the                            patient. Return to normal activities tomorrow.                            Written discharge instructions were provided to the                            patient.                           - High fiber diet today.                           - Continue present medications.                            - Repeat colonoscopy in 5 years for screening                            purposes. Procedure Code(s):        --- Professional ---                           [phone], Colonoscopy, flexible; diagnostic, including                            collection of specimen(s) by brushing or washing,                            when performed (separate procedure) Diagnosis Code(s):        --- Professional ---                           Z12.11, Encounter for screening for malignant                            neoplasm of colon                           Z80.0, Family history of malignant neoplasm of                            digestive organs  K64.4, Residual hemorrhoidal skin tags                           K57.30, Diverticulosis of large intestine without                            perforation or abscess without bleeding CPT copyright 2019 American Medical Association. All rights reserved. The codes documented in this report are preliminary and upon coder review may  be revised to meet current compliance requirements. Lionel December, MD Lionel December, MD 12/22/2019 10:00:50 AM This report has been signed electronically. Number of Addenda: 0

## 2019-12-22 NOTE — Anesthesia Preprocedure Evaluation (Addendum)
Anesthesia Evaluation  Patient identified by MRN, date of birth, ID band Patient awake    Reviewed: Allergy & Precautions, NPO status , Patient's Chart, lab work & pertinent test results, reviewed documented beta blocker date and time   History of Anesthesia Complications (+) Family history of anesthesia reactionNegative for: history of anesthetic complications  Airway Mallampati: II  TM Distance: >3 FB Neck ROM: Full    Dental  (+) Loose, Missing, Dental Advisory Given,    Pulmonary shortness of breath and with exertion,    Pulmonary exam normal breath sounds clear to auscultation       Cardiovascular Exercise Tolerance: Poor hypertension, Pt. on medications and Pt. on home beta blockers + DOE  Normal cardiovascular exam Rhythm:Regular Rate:Normal  20-Dec-2019 15:41:17 [facility] ROUTINE RECORD Normal sinus rhythm with sinus arrhythmia Normal ECG No previous ECGs available Confirmed by Jodelle Red [phone]) on 12/20/2019 10:26:42 PM   Neuro/Psych negative neurological ROS  negative psych ROS   GI/Hepatic GERD  ,  Endo/Other    Renal/GU      Musculoskeletal  (+) Arthritis ,   Abdominal   Peds  Hematology  (+) anemia ,   Anesthesia Other Findings   Reproductive/Obstetrics                            Anesthesia Physical Anesthesia Plan  ASA: III  Anesthesia Plan: General   Post-op Pain Management:    Induction: Intravenous  PONV Risk Score and Plan: TIVA  Airway Management Planned: Nasal Cannula and Natural Airway  Additional Equipment:   Intra-op Plan:   Post-operative Plan:   Informed Consent: I have reviewed the patients History and Physical, chart, labs and discussed the procedure including the risks, benefits and alternatives for the proposed anesthesia with the patient or authorized representative who has indicated his/her understanding  and acceptance.     Dental advisory given  Plan Discussed with: CRNA and Surgeon  Anesthesia Plan Comments:         Anesthesia Quick Evaluation

## 2019-12-22 NOTE — H&P (Signed)
Whitney Bauer is an 64 y.o. female.   Chief Complaint: Patient is here for esophagogastroduodenoscopy and colonoscopy. HPI: Patient is 64 year old African-American female who presents with 7-day long history of intermittent epigastric pain without nausea vomiting melena anorexia or weight loss.  She states she was having this pain quite frequently but she has not had in the last 3 weeks since she stopped taking pain medication.  She is on Lovenox which she has taken for years and she says she is not had any side effects or issues with this medication.  Her bowels are regular.  She denies rectal bleeding. She had ultrasound in June 2019 and was negative for cholelithiasis. Her last colonoscopy was normal in July 2015. Family history significant for colon carcinoma in her father who was around 48 at the time of diagnosis.  Past Medical History:  Diagnosis Date  . Anemia   . Arthritis    bilat. knees  . Back pain   . Constipation   . Family history of adverse reaction to anesthesia    patients mother had cardiac issues during anesthesia  . GERD (gastroesophageal reflux disease)   . Hypertension   . Joint pain   . Lactose intolerance   . Rheumatoid arthritis (HCC)   . Swelling of both lower extremities     Past Surgical History:  Procedure Laterality Date  . COLONOSCOPY N/A 01/06/2014   Procedure: COLONOSCOPY;  Surgeon: Malissa Hippo, MD;  [facility];  Service: Endoscopy;  Laterality: N/A;  830  . FOOT SURGERY    . PARTIAL HYSTERECTOMY  jan 1999    Family History  Problem Relation Age of Onset  . Asthma Mother   . Heart disease Mother   . Pancreatic cancer Mother   . Ovarian cancer Mother   . Diabetes Mother   . Stroke Mother   . Asthma Father   . Colon cancer Father   . Prostate cancer Father   . Stroke Father   . High blood pressure Father   . Asthma Sister   . Cancer Paternal Grandmother        mouth   Social History:  reports that she has never smoked.  She has never used smokeless tobacco. She reports that she does not drink alcohol and does not use drugs.  Allergies:  Allergies  Allergen Reactions  . Pantoprazole Other (See Comments)    Facial/mouth/jaw spasms (facial droop)    Medications Prior to Admission  Medication Sig Dispense Refill  . atenolol-chlorthalidone (TENORETIC) 50-25 MG tablet Take 1 tablet by mouth daily.    . cyclobenzaprine (FLEXERIL) 10 MG tablet Take 10 mg by mouth daily as needed for muscle spasms.     . diclofenac (VOLTAREN) 75 MG EC tablet TAKE 1 TABLET (75 MG TOTAL) BY MOUTH 2 (TWO) TIMES DAILY WITH A MEAL. (Patient taking differently: Take 75 mg by mouth daily. ) 60 tablet 5  . loperamide (IMODIUM) 1 MG/5ML solution Take 2-4 mg by mouth 4 (four) times daily as needed for diarrhea or loose stools.     . trolamine salicylate (ASPERCREME) 10 % cream Apply 1-2 application topically 4 (four) times daily as needed for muscle pain.    Marland Kitchen esomeprazole (NEXIUM) 40 MG capsule Take 40 mg by mouth daily.    . phentermine 15 MG capsule Take 15 mg by mouth daily.      Results for orders placed or performed during the hospital encounter of 12/20/19 (from the past 48 hour(s))  SARS CORONAVIRUS 2 (TAT 6-24 HRS) Nasopharyngeal Nasopharyngeal Swab     Status: None   Collection Time: 12/20/19  3:37 PM   Specimen: Nasopharyngeal Swab  Result Value Ref Range   SARS Coronavirus 2 NEGATIVE NEGATIVE    Comment: (NOTE) SARS-CoV-2 target nucleic acids are NOT DETECTED.  The SARS-CoV-2 RNA is generally detectable in upper and lower respiratory specimens during the acute phase of infection. Negative results do not preclude SARS-CoV-2 infection, do not rule out co-infections with other pathogens, and should not be used as the sole basis for treatment or other patient management decisions. Negative results must be combined with clinical observations, patient history, and epidemiological information. The expected result is  Negative.  Fact Sheet for Patients: HairSlick.no  Fact Sheet for Healthcare Providers: quierodirigir.com  This test is not yet approved or cleared by the [place] FDA and  has been authorized for detection and/or diagnosis of SARS-CoV-2 by FDA under an Emergency Use Authorization (EUA). This EUA will remain  in effect (meaning this test can be used) for the duration of the COVID-19 declaration under Se ction 564(b)(1) of the Act, 21 U.S.C. section 360bbb-3(b)(1), unless the authorization is terminated or revoked sooner.  Performed at [facility], [address] [place], [place] [address]   CBC with Differential/Platelet     Status: None   Collection Time: 12/20/19  3:49 PM  Result Value Ref Range   WBC 4.5 4.0 - 10.5 K/uL   RBC 4.16 3.87 - 5.11 MIL/uL   Hemoglobin 12.2 12.0 - 15.0 g/dL   HCT 16.5 36 - 46 %   MCV 90.4 80.0 - 100.0 fL   MCH 29.3 26.0 - 34.0 pg   MCHC 32.4 30.0 - 36.0 g/dL   RDW 79.0 38.3 - 33.8 %   Platelets 216 150 - 400 K/uL   nRBC 0.0 0.0 - 0.2 %   Neutrophils Relative % 44 %   Neutro Abs 2.0 1.7 - 7.7 K/uL   Lymphocytes Relative 40 %   Lymphs Abs 1.8 0.7 - 4.0 K/uL   Monocytes Relative 10 %   Monocytes Absolute 0.5 0 - 1 K/uL   Eosinophils Relative 5 %   Eosinophils Absolute 0.2 0 - 0 K/uL   Basophils Relative 1 %   Basophils Absolute 0.0 0 - 0 K/uL   Immature Granulocytes 0 %   Abs Immature Granulocytes 0.02 0.00 - 0.07 K/uL    Comment: Performed at [facility], [address], [place], [place] [address]  Basic metabolic panel     Status: Abnormal   Collection Time: 12/20/19  3:49 PM  Result Value Ref Range   Sodium 139 135 - 145 mmol/L   Potassium 3.4 (L) 3.5 - 5.1 mmol/L   Chloride 103 98 - 111 mmol/L   CO2 28 22 - 32 mmol/L   Glucose, Bld 106 (H) 70 - 99 mg/dL    Comment: Glucose reference range applies only to samples taken after fasting for at least 8 hours.   BUN 21 8 - 23  mg/dL   Creatinine, Ser 1.66 0.44 - 1.00 mg/dL   Calcium 9.4 8.9 - 06.0 mg/dL   GFR calc non Af Amer >60 >60 mL/min   GFR calc Af Amer >60 >60 mL/min   Anion gap 8 5 - 15    Comment: Performed at [facility], [address] [place], [place] [address]  Magnesium     Status: None   Collection Time: 12/20/19  3:49 PM  Result Value Ref Range   Magnesium 2.1 1.7 - 2.4 mg/dL    Comment: Performed at [facility], [address] [place], [place] [address]   No results found.  Review of Systems  Blood pressure 124/66, pulse 62, temperature 97.7 F (36.5 C), temperature source Oral, resp. rate 16, SpO2 100 %. Physical Exam HENT:     Mouth/Throat:     Mouth: Mucous membranes are moist.     Pharynx: Oropharynx is clear.  Eyes:     General: No scleral icterus.    Conjunctiva/sclera: Conjunctivae normal.  Cardiovascular:     Rate and Rhythm: Normal rate and regular rhythm.     Heart sounds: Normal heart sounds. No murmur heard.   Pulmonary:     Effort: Pulmonary effort is normal.     Breath sounds: Normal breath sounds.  Abdominal:     Comments: Abdomen is full.  On palpation is soft and nontender with organomegaly or masses.  Musculoskeletal:     Cervical back: Neck supple.     Comments: She has bilateral nonpitting edema to both legs.  Lymphadenopathy:     Cervical: No cervical adenopathy.  Skin:    General: Skin is warm and dry.  Neurological:     Mental Status: She is alert.      Assessment/Plan  Chronic epigastric pain. Family history of CRC in first-degree relative. Diagnostic esophagogastroduodenoscopy and high risk screening colonoscopy.  Lionel December, MD 12/22/2019, 9:07 AM

## 2019-12-22 NOTE — Transfer of Care (Signed)
Immediate Anesthesia Transfer of Care Note  Patient: Whitney Bauer  Procedure(s) Performed: COLONOSCOPY WITH PROPOFOL (N/A ) ESOPHAGOGASTRODUODENOSCOPY (EGD) WITH PROPOFOL (N/A ) BIOPSY  Patient Location: PACU  Anesthesia Type:General  Level of Consciousness: awake, alert  and oriented  Airway & Oxygen Therapy: Patient Spontanous Breathing  Post-op Assessment: Report given to RN  Post vital signs: Reviewed and stable  Last Vitals:  Vitals Value Taken Time  BP 98/45 12/22/19 0953  Temp    Pulse    Resp 24 12/22/19 0954  SpO2    Vitals shown include unvalidated device data.  Last Pain:  Vitals:   12/22/19 0839  TempSrc: Oral  PainSc: 0-No pain      Patients Stated Pain Goal: 6 (12/22/19 0839)  Complications: No complications documented.

## 2019-12-22 NOTE — Discharge Instructions (Signed)
No aspirin or NSAIDs for 24 hours. Resume usual medications as before. High-fiber diet. No driving for 24 hours. Physician will call with biopsy results.     Colonoscopy, Adult, Care After This sheet gives you information about how to care for yourself after your procedure. Your doctor may also give you more specific instructions. If you have problems or questions, call your doctor. What can I expect after the procedure? After the procedure, it is common to have:  A small amount of blood in your poop (stool) for 24 hours.  Some gas.  Mild cramping or bloating in your belly (abdomen). Follow these instructions at home: Eating and drinking   Drink enough fluid to keep your pee (urine) pale yellow.  Follow instructions from your doctor about what you cannot eat or drink.  Return to your normal diet as told by your doctor. Avoid heavy or fried foods that are hard to digest. Activity  Rest as told by your doctor.  Do not sit for a long time without moving. Get up to take short walks every 1-2 hours. This is important. Ask for help if you feel weak or unsteady.  Return to your normal activities as told by your doctor. Ask your doctor what activities are safe for you. To help cramping and bloating:   Try walking around.  Put heat on your belly as told by your doctor. Use the heat source that your doctor recommends, such as a moist heat pack or a heating pad. ? Put a towel between your skin and the heat source. ? Leave the heat on for 20-30 minutes. ? Remove the heat if your skin turns bright red. This is very important if you are unable to feel pain, heat, or cold. You may have a greater risk of getting burned. General instructions  For the first 24 hours after the procedure: ? Do not drive or use machinery. ? Do not sign important documents. ? Do not drink alcohol. ? Do your daily activities more slowly than normal. ? Eat foods that are soft and easy to digest.  Take  over-the-counter or prescription medicines only as told by your doctor.  Keep all follow-up visits as told by your doctor. This is important. Contact a doctor if:  You have blood in your poop 2-3 days after the procedure. Get help right away if:  You have more than a small amount of blood in your poop.  You see large clumps of tissue (blood clots) in your poop.  Your belly is swollen.  You feel like you may vomit (nauseous).  You vomit.  You have a fever.  You have belly pain that gets worse, and medicine does not help your pain. Summary  After the procedure, it is common to have a small amount of blood in your poop. You may also have mild cramping and bloating in your belly.  For the first 24 hours after the procedure, do not drive or use machinery, do not sign important documents, and do not drink alcohol.  Get help right away if you have a lot of blood in your poop, feel like you may vomit, have a fever, or have more belly pain. This information is not intended to replace advice given to you by your health care provider. Make sure you discuss any questions you have with your health care provider. Document Revised: 10/05/2018 Document Reviewed: 10/05/2018 Elsevier Patient Education  2020 Elsevier Inc.    High-Fiber Diet Fiber, also called dietary fiber, is   a type of carbohydrate that is found in fruits, vegetables, whole grains, and beans. A high-fiber diet can have many health benefits. Your health care provider may recommend a high-fiber diet to help:  Prevent constipation. Fiber can make your bowel movements more regular.  Lower your cholesterol.  Relieve the following conditions: ? Swelling of veins in the anus (hemorrhoids). ? Swelling and irritation (inflammation) of specific areas of the digestive tract (uncomplicated diverticulosis). ? A problem of the large intestine (colon) that sometimes causes pain and diarrhea (irritable bowel syndrome, IBS).  Prevent  overeating as part of a weight-loss plan.  Prevent heart disease, type 2 diabetes, and certain cancers. What is my plan? The recommended daily fiber intake in grams (g) includes:  38 g for men age 50 or younger.  30 g for men over age 50.  25 g for women age 50 or younger.  21 g for women over age 50. You can get the recommended daily intake of dietary fiber by:  Eating a variety of fruits, vegetables, grains, and beans.  Taking a fiber supplement, if it is not possible to get enough fiber through your diet. What do I need to know about a high-fiber diet?  It is better to get fiber through food sources rather than from fiber supplements. There is not a lot of research about how effective supplements are.  Always check the fiber content on the nutrition facts label of any prepackaged food. Look for foods that contain 5 g of fiber or more per serving.  Talk with a diet and nutrition specialist (dietitian) if you have questions about specific foods that are recommended or not recommended for your medical condition, especially if those foods are not listed below.  Gradually increase how much fiber you consume. If you increase your intake of dietary fiber too quickly, you may have bloating, cramping, or gas.  Drink plenty of water. Water helps you to digest fiber. What are tips for following this plan?  Eat a wide variety of high-fiber foods.  Make sure that half of the grains that you eat each day are whole grains.  Eat breads and cereals that are made with whole-grain flour instead of refined flour or white flour.  Eat brown rice, bulgur wheat, or millet instead of white rice.  Start the day with a breakfast that is high in fiber, such as a cereal that contains 5 g of fiber or more per serving.  Use beans in place of meat in soups, salads, and pasta dishes.  Eat high-fiber snacks, such as berries, raw vegetables, nuts, and popcorn.  Choose whole fruits and vegetables instead  of processed forms like juice or sauce. What foods can I eat?  Fruits Berries. Pears. Apples. Oranges. Avocado. Prunes and raisins. Dried figs. Vegetables Sweet potatoes. Spinach. Kale. Artichokes. Cabbage. Broccoli. Cauliflower. Green peas. Carrots. Squash. Grains Whole-grain breads. Multigrain cereal. Oats and oatmeal. Brown rice. Barley. Bulgur wheat. Millet. Quinoa. Bran muffins. Popcorn. Rye wafer crackers. Meats and other proteins Navy, kidney, and pinto beans. Soybeans. Split peas. Lentils. Nuts and seeds. Dairy Fiber-fortified yogurt. Beverages Fiber-fortified soy milk. Fiber-fortified orange juice. Other foods Fiber bars. The items listed above may not be a complete list of recommended foods and beverages. Contact a dietitian for more options. What foods are not recommended? Fruits Fruit juice. Cooked, strained fruit. Vegetables Fried potatoes. Canned vegetables. Well-cooked vegetables. Grains White bread. Pasta made with refined flour. White rice. Meats and other proteins Fatty cuts of meat. Fried chicken   or fried fish. Dairy Milk. Yogurt. Cream cheese. Sour cream. Fats and oils Butters. Beverages Soft drinks. Other foods Cakes and pastries. The items listed above may not be a complete list of foods and beverages to avoid. Contact a dietitian for more information. Summary  Fiber is a type of carbohydrate. It is found in fruits, vegetables, whole grains, and beans.  There are many health benefits of eating a high-fiber diet, such as preventing constipation, lowering blood cholesterol, helping with weight loss, and reducing your risk of heart disease, diabetes, and certain cancers.  Gradually increase your intake of fiber. Increasing too fast can result in cramping, bloating, and gas. Drink plenty of water while you increase your fiber.  The best sources of fiber include whole fruits and vegetables, whole grains, nuts, seeds, and beans. This information is not  intended to replace advice given to you by your health care provider. Make sure you discuss any questions you have with your health care provider. Document Revised: 01/13/2017 Document Reviewed: 01/13/2017 Elsevier Patient Education  2020 Elsevier Inc.    Monitored Anesthesia Care, Care After These instructions provide you with information about caring for yourself after your procedure. Your health care provider may also give you more specific instructions. Your treatment has been planned according to current medical practices, but problems sometimes occur. Call your health care provider if you have any problems or questions after your procedure. What can I expect after the procedure? After your procedure, you may:  Feel sleepy for several hours.  Feel clumsy and have poor balance for several hours.  Feel forgetful about what happened after the procedure.  Have poor judgment for several hours.  Feel nauseous or vomit.  Have a sore throat if you had a breathing tube during the procedure. Follow these instructions at home: For at least 24 hours after the procedure:      Have a responsible adult stay with you. It is important to have someone help care for you until you are awake and alert.  Rest as needed.  Do not: ? Participate in activities in which you could fall or become injured. ? Drive. ? Use heavy machinery. ? Drink alcohol. ? Take sleeping pills or medicines that cause drowsiness. ? Make important decisions or sign legal documents. ? Take care of children on your own. Eating and drinking  Follow the diet that is recommended by your health care provider.  If you vomit, drink water, juice, or soup when you can drink without vomiting.  Make sure you have little or no nausea before eating solid foods. General instructions  Take over-the-counter and prescription medicines only as told by your health care provider.  If you have sleep apnea, surgery and certain  medicines can increase your risk for breathing problems. Follow instructions from your health care provider about wearing your sleep device: ? Anytime you are sleeping, including during daytime naps. ? While taking prescription pain medicines, sleeping medicines, or medicines that make you drowsy.  If you smoke, do not smoke without supervision.  Keep all follow-up visits as told by your health care provider. This is important. Contact a health care provider if:  You keep feeling nauseous or you keep vomiting.  You feel light-headed.  You develop a rash.  You have a fever. Get help right away if:  You have trouble breathing. Summary  For several hours after your procedure, you may feel sleepy and have poor judgment.  Have a responsible adult stay with you for at   least 24 hours or until you are awake and alert. This information is not intended to replace advice given to you by your health care provider. Make sure you discuss any questions you have with your health care provider. Document Revised: 06/09/2017 Document Reviewed: 07/02/2015 Elsevier Patient Education  2020 Elsevier Inc.  

## 2019-12-23 ENCOUNTER — Other Ambulatory Visit: Payer: Self-pay

## 2019-12-23 LAB — SURGICAL PATHOLOGY

## 2019-12-27 ENCOUNTER — Encounter (HOSPITAL_COMMUNITY): Payer: Self-pay | Admitting: Internal Medicine

## 2019-12-27 ENCOUNTER — Telehealth: Payer: Self-pay | Admitting: Orthopaedic Surgery

## 2019-12-27 NOTE — Telephone Encounter (Signed)
elays she was contacted by [facility] for her referral appointment. States she had asked for a "closer location", and was scheduled  At [facility] location in [facility] for appointment Thursday, 12/30/19, 10:45am, with provider, orthopaedic specialist Gaynelle Cage. States "she hopes Dr Hilda Lias does not mind that she asked for a location closer than some of the other options.  Assured her that this sounds fine, and that I will relay to Dr Hilda Lias. (referral workqueue updated as well).

## 2019-12-27 NOTE — Telephone Encounter (Signed)
Tell her that is fine.  She needs to be comfortable.

## 2019-12-28 NOTE — Telephone Encounter (Signed)
Pt aware.

## 2019-12-29 ENCOUNTER — Encounter (HOSPITAL_COMMUNITY): Payer: Self-pay | Admitting: Physical Therapy

## 2019-12-29 ENCOUNTER — Ambulatory Visit (HOSPITAL_COMMUNITY): Payer: No Typology Code available for payment source | Attending: Neurological Surgery | Admitting: Physical Therapy

## 2019-12-29 ENCOUNTER — Other Ambulatory Visit: Payer: Self-pay

## 2019-12-29 DIAGNOSIS — M6281 Muscle weakness (generalized): Secondary | ICD-10-CM | POA: Insufficient documentation

## 2019-12-29 DIAGNOSIS — R29898 Other symptoms and signs involving the musculoskeletal system: Secondary | ICD-10-CM | POA: Insufficient documentation

## 2019-12-29 DIAGNOSIS — M542 Cervicalgia: Secondary | ICD-10-CM | POA: Insufficient documentation

## 2019-12-29 NOTE — Therapy (Signed)
[facility] [address] [place], [place], [address] Phone: [phone]   Fax:  [phone]  Physical Therapy Evaluation  Patient Details  Name: Whitney Bauer MRN: [id number] Date of Birth: 10/04/1955 Referring Provider (PT): Barnett Abu, MD   Encounter Date: 12/29/2019   PT End of Session - 12/29/19 1538    Visit Number 1    Number of Visits 10    Date for PT Re-Evaluation 02/02/20    Authorization Type [employer] ([employer]; no auth required; 60 visit limit PT/OT/ST)    Progress Note Due on Visit 10    PT Start Time 1440    PT Stop Time 1525    PT Time Calculation (min) 45 min    Activity Tolerance Patient tolerated treatment well;No increased pain    Behavior During Therapy WFL for tasks assessed/performed           Past Medical History:  Diagnosis Date  . Anemia   . Arthritis    bilat. knees  . Back pain   . Constipation   . Family history of adverse reaction to anesthesia    patients mother had cardiac issues during anesthesia  . GERD (gastroesophageal reflux disease)   . Hypertension   . Joint pain   . Lactose intolerance   . Rheumatoid arthritis (HCC)   . Swelling of both lower extremities     Past Surgical History:  Procedure Laterality Date  . BIOPSY  12/22/2019   Procedure: BIOPSY;  Surgeon: Malissa Hippo, MD;  [facility];  Service: Endoscopy;;  antral  . COLONOSCOPY N/A 01/06/2014   Procedure: COLONOSCOPY;  Surgeon: Malissa Hippo, MD;  [facility];  Service: Endoscopy;  Laterality: N/A;  830  . COLONOSCOPY WITH PROPOFOL N/A 12/22/2019   Procedure: COLONOSCOPY WITH PROPOFOL;  Surgeon: Malissa Hippo, MD;  [facility];  Service: Endoscopy;  Laterality: N/A;  930  . ESOPHAGOGASTRODUODENOSCOPY (EGD) WITH PROPOFOL N/A 12/22/2019   Procedure: ESOPHAGOGASTRODUODENOSCOPY (EGD) WITH PROPOFOL;  Surgeon: Malissa Hippo, MD;  [facility];  Service: Endoscopy;   Laterality: N/A;  . FOOT SURGERY    . PARTIAL HYSTERECTOMY  jan 1999    There were no vitals filed for this visit.    Subjective Assessment - 12/29/19 1448    Subjective Patient reports that she has been getting a pain in the left side of her neck which started in April 2021 and now seems to be moving to the right side of her neck in the past 2-3 days. Patient denied any tingling or numbness. Patient reported that the pain travels down to the top of her shoulder. Patient reports that she has trouble with lifting her left arm as well. Denied any dizziness, speaking issues, or swallowing issues. Patient reports that once she gets up and gets moving the pain decreases.    Pertinent History Neck pain and arm weakness of insidious onset    Limitations Lifting    Patient Stated Goals To avoid neck surgery    Currently in Pain? No/denies              [facility] PT Assessment - 12/29/19 0001      Assessment   Medical Diagnosis Cervicalgia    Referring Provider (PT) Barnett Abu, MD    Onset Date/Surgical Date --   April 2021   Hand Dominance Right    Next MD Visit 02/04/20    Prior Therapy Yes, for knees  Precautions   Precautions None      Restrictions   Weight Bearing Restrictions No      Balance Screen   Has the patient fallen in the past 6 months No      Home Environment   Living Environment Private residence    Living Arrangements Alone    Type of Home House    Home Layout One level      Prior Function   Level of Independence Independent with community mobility without device    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    Leisure Crosswords      Cognition   Overall Cognitive Status Within Functional Limits for tasks assessed      Observation/Other Assessments   Focus on Therapeutic Outcomes (FOTO)  57.1%      ROM / Strength   AROM / PROM / Strength AROM;Strength      AROM   Overall AROM Comments PROM of LT shoulder WFL    AROM  Assessment Site Cervical    Cervical Flexion 50    Cervical Extension 55    Cervical - Right Side Bend 30   Pulling on left side   Cervical - Left Side Bend 33    Cervical - Right Rotation 70    Cervical - Left Rotation 65      Strength   Overall Strength Comments Elbow strength, wrist strength, and grip [facility]    Strength Assessment Site Shoulder    Right/Left Shoulder Right;Left    Right Shoulder Flexion 4/5    Right Shoulder ABduction 4-/5    Right Shoulder Internal Rotation --   Full resistance in seated 90/90 position   Right Shoulder External Rotation --   Full resistance in seated 90/90 position   Left Shoulder Flexion 3-/5    Left Shoulder ABduction 3-/5    Left Shoulder Internal Rotation --   Full resistance in seated 90/90 position   Left Shoulder External Rotation --   Full resistance in seated 90/90 position     Palpation   Palpation comment Denied tenderness to palpation through cervical paraspinals or periscapular muscles                      Objective measurements completed on examination: See above findings.               PT Education - 12/29/19 1536    Education Details Discussed examination findings POC and initial HEP.    Person(s) Educated Patient    Methods Explanation;Handout    Comprehension Verbalized understanding            PT Short Term Goals - 12/29/19 1540      PT SHORT TERM GOAL #1   Title Patient will report understanding and regular compliance with HEP to improve strength, mobility and decrease pain.    Time 2    Period Weeks    Status New    Target Date 01/12/20      PT SHORT TERM GOAL #2   Title Patient will report improvement in overall subjective complaint of at least 25% for improved QOL.    Time 2    Period Weeks    Status New    Target Date 01/12/20             PT Long Term Goals - 12/29/19 1541      PT LONG TERM GOAL #1   Title Patient will report improvement in  overall subjective complaint of  at least 50% for improved QOL.    Time 5    Period Weeks    Status New    Target Date 02/02/20      PT LONG TERM GOAL #2   Title Patient will demonstrate left shoulder strength of at least 4/5 in all planes of movement tested as deficient evaluation, in order to improve ease of reaching to place dishes overhead with both hands.    Time 5    Period Weeks    Status New    Target Date 02/02/20      PT LONG TERM GOAL #3   Title Patient will demonstrate full pain-free cervical AROM for improved ease of observing the environment.    Time 5    Period Weeks    Status New    Target Date 02/02/20                  Plan - 12/29/19 1547    Clinical Impression Statement Patient is a 64 year old female who presents to outpatient physical therapy with primary complaints of neck pain and left shoulder weakness which have been ongoing for several months. Upon examination, patient demonstrates some limitations in cervical AROM and demonstrates significant deficits in left shoulder strength particularly shoulder flexion and abduction, although patient's PROM of the left shoulder is WFL. Patient is currently limited in her functional mobility due to these deficits. Patient would benefit from continued skilled physical therapy in order to address the abovementioned deficits and help patient return to her PLOF. Therapist educated patient on initial HEP.    Personal Factors and Comorbidities Time since onset of injury/illness/exacerbation;Comorbidity 2    Comorbidities HTN; obesity    Examination-Activity Limitations Lift;Reach Overhead    Examination-Participation Restrictions Community Activity;Laundry;Shop;Meal Prep;Cleaning    Stability/Clinical Decision Making Stable/Uncomplicated    Clinical Decision Making Low    Rehab Potential Fair    PT Frequency 2x / week    PT Duration Other (comment)   5 weeks   PT Treatment/Interventions ADLs/Self Care Home Management;Cryotherapy;Electrical  Stimulation;Moist Heat;Functional mobility training;Therapeutic activities;Therapeutic exercise;Patient/family education;Manual techniques;Passive range of motion;Dry needling;Energy conservation;Taping    PT Next Visit Plan Review HEP, trial cervical retractions next session, wall walks and exercises to improve left shoulder/scapular strength    PT Home Exercise Plan 12/29/19: table slides flexion and abduction, scapular retraction    Consulted and Agree with Plan of Care Patient           Patient will benefit from skilled therapeutic intervention in order to improve the following deficits and impairments:  Pain, Decreased mobility, Decreased activity tolerance, Decreased endurance, Decreased range of motion, Decreased strength, Hypomobility, Obesity  Visit Diagnosis: Cervicalgia  Muscle weakness (generalized)  Other symptoms and signs involving the musculoskeletal system     Problem List Patient Active Problem List   Diagnosis Date Noted  . Vitamin D deficiency 05/06/2018  . Gastroesophageal reflux disease 04/13/2018  . Essential hypertension 04/13/2018  . Class 3 severe obesity with serious comorbidity and body mass index (BMI) of 50.0 to 59.9 in adult [facility]) 04/13/2018  . Derangement of posterior horn of medial meniscus of right knee 09/10/2016  . Chronic pain of right knee 09/10/2016  . DOE (dyspnea on exertion) 01/28/2011  . Muscle weakness (generalized) 10/23/2010   Verne Carrow PT, DPT 3:53 PM, 12/29/19 [phone]  [facility] [address] [place], [place], [address] Phone: [phone]   Fax:  [phone]  Name: JOHNESHA ACHEAMPONG MRN: [id number] Date of Birth: 10-Dec-1955

## 2019-12-29 NOTE — Patient Instructions (Signed)
Medbridge access code: [email or web address]

## 2020-01-06 ENCOUNTER — Encounter (HOSPITAL_COMMUNITY): Payer: No Typology Code available for payment source

## 2020-01-17 ENCOUNTER — Telehealth (HOSPITAL_COMMUNITY): Payer: Self-pay

## 2020-01-17 ENCOUNTER — Ambulatory Visit (HOSPITAL_COMMUNITY): Payer: No Typology Code available for payment source | Admitting: Physical Therapy

## 2020-01-17 NOTE — Telephone Encounter (Signed)
pt called to cx this appt due to she has a stomache

## 2020-01-20 ENCOUNTER — Ambulatory Visit (HOSPITAL_COMMUNITY): Payer: No Typology Code available for payment source

## 2020-01-20 ENCOUNTER — Encounter (HOSPITAL_COMMUNITY): Payer: Self-pay

## 2020-01-20 ENCOUNTER — Other Ambulatory Visit: Payer: Self-pay

## 2020-01-20 DIAGNOSIS — M6281 Muscle weakness (generalized): Secondary | ICD-10-CM

## 2020-01-20 DIAGNOSIS — M542 Cervicalgia: Secondary | ICD-10-CM | POA: Diagnosis not present

## 2020-01-20 DIAGNOSIS — R29898 Other symptoms and signs involving the musculoskeletal system: Secondary | ICD-10-CM

## 2020-01-20 NOTE — Therapy (Signed)
[facility] [address] [place], [place], [address] Phone: [phone]   Fax:  [phone]  Physical Therapy Treatment  Patient Details  Name: Whitney Bauer MRN: [id number] Date of Birth: [birth year] Referring Provider (PT): Barnett Abu, MD   Encounter Date: 01/20/2020   PT End of Session - 01/20/20 1537    Visit Number 2    Number of Visits 10    Date for PT Re-Evaluation 02/02/20    Authorization Type [employer] ([employer]; no auth required; 60 visit limit PT/OT/ST)    Progress Note Due on Visit 10    PT Start Time 1532    PT Stop Time 1612    PT Time Calculation (min) 40 min    Activity Tolerance Patient tolerated treatment well;No increased pain;Patient limited by fatigue    Behavior During Therapy [facility] for tasks assessed/performed           Past Medical History:  Diagnosis Date  . Anemia   . Arthritis    bilat. knees  . Back pain   . Constipation   . Family history of adverse reaction to anesthesia    patients mother had cardiac issues during anesthesia  . GERD (gastroesophageal reflux disease)   . Hypertension   . Joint pain   . Lactose intolerance   . Rheumatoid arthritis (HCC)   . Swelling of both lower extremities     Past Surgical History:  Procedure Laterality Date  . BIOPSY  12/22/2019   Procedure: BIOPSY;  Surgeon: Malissa Hippo, MD;  [facility];  Service: Endoscopy;;  antral  . COLONOSCOPY N/A 01/06/2014   Procedure: COLONOSCOPY;  Surgeon: Malissa Hippo, MD;  [facility];  Service: Endoscopy;  Laterality: N/A;  830  . COLONOSCOPY WITH PROPOFOL N/A 12/22/2019   Procedure: COLONOSCOPY WITH PROPOFOL;  Surgeon: Malissa Hippo, MD;  [facility];  Service: Endoscopy;  Laterality: N/A;  930  . ESOPHAGOGASTRODUODENOSCOPY (EGD) WITH PROPOFOL N/A 12/22/2019   Procedure: ESOPHAGOGASTRODUODENOSCOPY (EGD) WITH PROPOFOL;  Surgeon: Malissa Hippo, MD;  [facility];  Service: Endoscopy;  Laterality: N/A;  . FOOT SURGERY    . PARTIAL HYSTERECTOMY  jan 1999    There were no vitals filed for this visit.   Subjective Assessment - 01/20/20 1535    Subjective Pt reports her neck is feeling good today.  Reports her arms went numb the day following arm slides at home so she stopped doing the exercises.    Pertinent History Neck pain and arm weakness of insidious onset    Patient Stated Goals To avoid neck surgery    Currently in Pain? No/denies                             [facility] Adult PT Treatment/Exercise - 01/20/20 0001      Exercises   Exercises Shoulder      Shoulder Exercises: Supine   Flexion AAROM;10 reps;Left    Flexion Limitations AAROM with cane    ABduction Left;10 reps    ABduction Limitations AAROM with cane      Shoulder Exercises: Seated   Flexion AROM;AAROM;Left    Abduction AROM;AAROM;Left;10 reps    Other Seated Exercises cervical retraction 10x 5"    Other Seated Exercises scapular retractoin 10x 5"      Shoulder Exercises: Standing   Flexion 5 reps;Left    Flexion Limitations 2 sets,  limited by fatigue                  PT Education - 01/20/20 1626    Education Details Reviewed goals and compliance iwht HEP.  Pt stated her arms went numb following HEP so stopped.  Reviewed importance of compliance iwht HEP and modified current exercise program to address s/s.  Printout given and pt able to demonstrate wiht cueing to reduce leaning back with posture exercises.    Person(s) Educated Patient    Methods Explanation;Demonstration;Verbal cues;Handout    Comprehension Verbalized understanding;Returned demonstration;Need further instruction            PT Short Term Goals - 12/29/19 1540      PT SHORT TERM GOAL #1   Title Patient will report understanding and regular compliance with HEP to improve strength, mobility and decrease pain.    Time 2    Period Weeks    Status New    Target Date  01/12/20      PT SHORT TERM GOAL #2   Title Patient will report improvement in overall subjective complaint of at least 25% for improved QOL.    Time 2    Period Weeks    Status New    Target Date 01/12/20             PT Long Term Goals - 12/29/19 1541      PT LONG TERM GOAL #1   Title Patient will report improvement in overall subjective complaint of at least 50% for improved QOL.    Time 5    Period Weeks    Status New    Target Date 02/02/20      PT LONG TERM GOAL #2   Title Patient will demonstrate left shoulder strength of at least 4/5 in all planes of movement tested as deficient evaluation, in order to improve ease of reaching to place dishes overhead with both hands.    Time 5    Period Weeks    Status New    Target Date 02/02/20      PT LONG TERM GOAL #3   Title Patient will demonstrate full pain-free cervical AROM for improved ease of observing the environment.    Time 5    Period Weeks    Status New    Target Date 02/02/20                 Plan - 01/20/20 1540    Clinical Impression Statement Reviewed goals, educated importance of HEP compliance.  Pt stated she experienced numbness Bil shoulders following table slides.  Modified current HEP with ability to demonstrate and verbalize understanding.  Session focus on UE strengthening and postural awareness.  Pt with tendency to lean back during sitting UE movements and postural strenghtening exercises.  Encouraged to improve awareness of posture with exercises to reduce compensation.  Noted UE fatigue following 5 reps during wall slides.  No reports of pain through session, was limited by fatigue.    Personal Factors and Comorbidities Time since onset of injury/illness/exacerbation;Comorbidity 2    Comorbidities HTN; obesity    Examination-Activity Limitations Lift;Reach Overhead    Examination-Participation Restrictions Community Activity;Laundry;Shop;Meal Prep;Cleaning    Stability/Clinical Decision Making  Stable/Uncomplicated    Clinical Decision Making Low    Rehab Potential Fair    PT Frequency 2x / week    PT Duration --   5 weeks   PT Treatment/Interventions ADLs/Self Care Home Management;Cryotherapy;Electrical Stimulation;Moist Heat;Functional mobility training;Therapeutic activities;Therapeutic exercise;Patient/family  education;Manual techniques;Passive range of motion;Dry needling;Energy conservation;Taping    PT Next Visit Plan Review form/mechanics with current HEP.  Review form with cervical retractions next session, wall walks and exercises to improve left shoulder/scapular strength    PT Home Exercise Plan 12/29/19: table slides flexion and abduction, scapular retraction; 01/20/20: modified: AAROM with UE flexion and abduction wiht cane supine then progress to sitting wiht good posture, cervical and scapular retraction           Patient will benefit from skilled therapeutic intervention in order to improve the following deficits and impairments:  Pain, Decreased mobility, Decreased activity tolerance, Decreased endurance, Decreased range of motion, Decreased strength, Hypomobility, Obesity  Visit Diagnosis: Cervicalgia  Muscle weakness (generalized)  Other symptoms and signs involving the musculoskeletal system     Problem List Patient Active Problem List   Diagnosis Date Noted  . Vitamin D deficiency 05/06/2018  . Gastroesophageal reflux disease 04/13/2018  . Essential hypertension 04/13/2018  . Class 3 severe obesity with serious comorbidity and body mass index (BMI) of 50.0 to 59.9 in adult [facility]) 04/13/2018  . Derangement of posterior horn of medial meniscus of right knee 09/10/2016  . Chronic pain of right knee 09/10/2016  . DOE (dyspnea on exertion) 01/28/2011  . Muscle weakness (generalized) 10/23/2010   Becky Sax, LPTA/CLT; CBIS [phone]  Juel Burrow 01/20/2020, 4:30 PM  [facility] [address] [place], [place], [address] Phone: [phone]   Fax:  [phone]  Name: BLAKELY MARANAN MRN: [id number] Date of Birth: 1956/01/13

## 2020-01-20 NOTE — Patient Instructions (Signed)
Shoulder: Flexion (Supine)    With hands shoulder width apart, slowly lower dowel to floor behind head. Do not let elbows bend. Keep back flat. Hold 5 seconds. Repeat 10 times. Do 2 sessions per day. CAUTION: Stretch slowly and gently.  Copyright  [employer]. All rights reserved.   Flexion (Assistive)    Clasp hands together and raise arms above head, keeping elbows as straight as possible. Can be done sitting or lying. Repeat 10 times. Do 2 sessions per day.  Copyright  [employer]. All rights reserved.   Shoulder: Abduction (Supine)    With left arm on end of cane and thumbs up. Slowly move arm up to side of head by pushing with opposite arm. Do not let elbow bend. Hold 5seconds. Repeat 10 times. Do 2 sessions per day. CAUTION: Stretch slowly and gently.  Copyright  [employer]HI. All rights reserved.   SHOULDER: Abduction / Adduction (Cane)    Thumbs up! Hold cane with both hands. Raise arms out to one side.  Hold 5 seconds each side. 10 reps per set, 2 sets per day, 4 days per week  Copyright  [employer]HI. All rights reserved.   Flexibility: Neck Retraction     Sitting tall, no leaning back. Pull head straight back, keeping eyes and jaw level. Repeat 10 times per set. Do 2 sets per day.  http://orth.exer.us/345   Copyright  VHI. All rights reserved.   SCAPULA: Retraction    Sitting tall, no leaning back.  Pinch shoulder blades together. Do not shrug shoulders. Hold 5 seconds.  10 reps per set, 2 sets per day, 4 days per week  Copyright  VHI. All rights reserved.

## 2020-01-25 ENCOUNTER — Other Ambulatory Visit: Payer: Self-pay

## 2020-01-25 ENCOUNTER — Ambulatory Visit (HOSPITAL_COMMUNITY): Payer: No Typology Code available for payment source | Attending: Neurological Surgery | Admitting: Physical Therapy

## 2020-01-25 DIAGNOSIS — M6281 Muscle weakness (generalized): Secondary | ICD-10-CM | POA: Insufficient documentation

## 2020-01-25 DIAGNOSIS — R29898 Other symptoms and signs involving the musculoskeletal system: Secondary | ICD-10-CM | POA: Diagnosis present

## 2020-01-25 DIAGNOSIS — M542 Cervicalgia: Secondary | ICD-10-CM | POA: Insufficient documentation

## 2020-01-25 NOTE — Therapy (Signed)
[facility] [address] [place], [place], [address] Phone: [phone]   Fax:  [phone]  Physical Therapy Treatment  Patient Details  Name: Whitney Bauer MRN: [id number] Date of Birth: Jul 12, 1955 Referring Provider (PT): Barnett Abu, MD   Encounter Date: 01/25/2020   PT End of Session - 01/25/20 1554    Visit Number 3    Number of Visits 10    Date for PT Re-Evaluation 02/02/20    Authorization Type [employer] ([employer]; no auth required; 60 visit limit PT/OT/ST)    Progress Note Due on Visit 10    PT Start Time 1450    PT Stop Time 1530    PT Time Calculation (min) 40 min    Activity Tolerance Patient tolerated treatment well;No increased pain;Patient limited by fatigue    Behavior During Therapy [facility] for tasks assessed/performed           Past Medical History:  Diagnosis Date  . Anemia   . Arthritis    bilat. knees  . Back pain   . Constipation   . Family history of adverse reaction to anesthesia    patients mother had cardiac issues during anesthesia  . GERD (gastroesophageal reflux disease)   . Hypertension   . Joint pain   . Lactose intolerance   . Rheumatoid arthritis (HCC)   . Swelling of both lower extremities     Past Surgical History:  Procedure Laterality Date  . BIOPSY  12/22/2019   Procedure: BIOPSY;  Surgeon: Malissa Hippo, MD;  [facility];  Service: Endoscopy;;  antral  . COLONOSCOPY N/A 01/06/2014   Procedure: COLONOSCOPY;  Surgeon: Malissa Hippo, MD;  [facility];  Service: Endoscopy;  Laterality: N/A;  830  . COLONOSCOPY WITH PROPOFOL N/A 12/22/2019   Procedure: COLONOSCOPY WITH PROPOFOL;  Surgeon: Malissa Hippo, MD;  [facility];  Service: Endoscopy;  Laterality: N/A;  930  . ESOPHAGOGASTRODUODENOSCOPY (EGD) WITH PROPOFOL N/A 12/22/2019   Procedure: ESOPHAGOGASTRODUODENOSCOPY (EGD) WITH PROPOFOL;  Surgeon: Malissa Hippo, MD;  [facility];  Service: Endoscopy;  Laterality: N/A;  . FOOT SURGERY    . PARTIAL HYSTERECTOMY  jan 1999    There were no vitals filed for this visit.   Subjective Assessment - 01/25/20 1456    Subjective pt states she stopped some of her exercises because they were hurting her back.  Currently no pain in her cervical region but back is 4/10 currently.    Currently in Pain? No/denies                             [facility] Adult PT Treatment/Exercise - 01/25/20 0001      Shoulder Exercises: Supine   Flexion AAROM;10 reps;Left    Flexion Limitations AAROM with cane    ABduction Left;10 reps    ABduction Limitations AAROM with cane    Other Supine Exercises decompression 1-5 5 reps      Shoulder Exercises: Seated   Other Seated Exercises cervical retraction 10x 5"    Other Seated Exercises scapular retractoin 10x 5"      Shoulder Exercises: Standing   Flexion 10 reps;Both    Other Standing Exercises wall facing slides into flexion 5X5"                    PT Short Term Goals - 12/29/19 1540  PT SHORT TERM GOAL #1   Title Patient will report understanding and regular compliance with HEP to improve strength, mobility and decrease pain.    Time 2    Period Weeks    Status New    Target Date 01/12/20      PT SHORT TERM GOAL #2   Title Patient will report improvement in overall subjective complaint of at least 25% for improved QOL.    Time 2    Period Weeks    Status New    Target Date 01/12/20             PT Long Term Goals - 12/29/19 1541      PT LONG TERM GOAL #1   Title Patient will report improvement in overall subjective complaint of at least 50% for improved QOL.    Time 5    Period Weeks    Status New    Target Date 02/02/20      PT LONG TERM GOAL #2   Title Patient will demonstrate left shoulder strength of at least 4/5 in all planes of movement tested as deficient evaluation, in order to improve ease of reaching to place dishes  overhead with both hands.    Time 5    Period Weeks    Status New    Target Date 02/02/20      PT LONG TERM GOAL #3   Title Patient will demonstrate full pain-free cervical AROM for improved ease of observing the environment.    Time 5    Period Weeks    Status New    Target Date 02/02/20                 Plan - 01/25/20 1552    Clinical Impression Statement Continued focus on postural stab and UE functional ROM. Added decompression exercises 1-5 to help reduce spinal congestion/pain. Pt able to complete these without difficulty.  Pt continues to be limited with bil UE ROM with poor stabilization with increased ROM. Added forward flexion facing wall with good results and stretch obtained.  Pt reported feeling better overall at EOS.    Personal Factors and Comorbidities Time since onset of injury/illness/exacerbation;Comorbidity 2    Comorbidities HTN; obesity    Examination-Activity Limitations Lift;Reach Overhead    Examination-Participation Restrictions Community Activity;Laundry;Shop;Meal Prep;Cleaning    Stability/Clinical Decision Making Stable/Uncomplicated    Rehab Potential Fair    PT Frequency 2x / week    PT Duration --   5 weeks   PT Treatment/Interventions ADLs/Self Care Home Management;Cryotherapy;Electrical Stimulation;Moist Heat;Functional mobility training;Therapeutic activities;Therapeutic exercise;Patient/family education;Manual techniques;Passive range of motion;Dry needling;Energy conservation;Taping    PT Next Visit Plan continue to improve form and ROM of UE's/postural strength.  Add standing theraband next session.    PT Home Exercise Plan 12/29/19: table slides flexion and abduction, scapular retraction; 01/20/20: modified: AAROM with UE flexion and abduction wiht cane supine then progress to sitting wiht good posture, cervical and scapular retraction           Patient will benefit from skilled therapeutic intervention in order to improve the following  deficits and impairments:  Pain, Decreased mobility, Decreased activity tolerance, Decreased endurance, Decreased range of motion, Decreased strength, Hypomobility, Obesity  Visit Diagnosis: Cervicalgia  Muscle weakness (generalized)  Other symptoms and signs involving the musculoskeletal system     Problem List Patient Active Problem List   Diagnosis Date Noted  . Vitamin D deficiency 05/06/2018  . Gastroesophageal reflux disease 04/13/2018  . Essential  hypertension 04/13/2018  . Class 3 severe obesity with serious comorbidity and body mass index (BMI) of 50.0 to 59.9 in adult [facility]) 04/13/2018  . Derangement of posterior horn of medial meniscus of right knee 09/10/2016  . Chronic pain of right knee 09/10/2016  . DOE (dyspnea on exertion) 01/28/2011  . Muscle weakness (generalized) 10/23/2010   Whitney Bauer, PTA/CLT [phone]  Whitney Bauer 01/25/2020, 3:55 PM  [facility] [address] [place], [place], [address] Phone: [phone]   Fax:  [phone]  Name: Whitney Bauer MRN: [id number] Date of Birth: [birth year]

## 2020-01-27 ENCOUNTER — Other Ambulatory Visit: Payer: Self-pay

## 2020-01-27 ENCOUNTER — Ambulatory Visit (HOSPITAL_COMMUNITY): Payer: No Typology Code available for payment source

## 2020-01-27 ENCOUNTER — Encounter: Payer: Self-pay | Admitting: Orthopaedic Surgery

## 2020-01-27 ENCOUNTER — Ambulatory Visit: Payer: No Typology Code available for payment source | Admitting: Orthopaedic Surgery

## 2020-01-27 VITALS — Ht 62.0 in | Wt 321.0 lb

## 2020-01-27 DIAGNOSIS — M25561 Pain in right knee: Secondary | ICD-10-CM

## 2020-01-27 DIAGNOSIS — Z6841 Body Mass Index (BMI) 40.0 and over, adult: Secondary | ICD-10-CM

## 2020-01-27 DIAGNOSIS — M25562 Pain in left knee: Secondary | ICD-10-CM

## 2020-01-27 DIAGNOSIS — G8929 Other chronic pain: Secondary | ICD-10-CM

## 2020-01-27 NOTE — Progress Notes (Deleted)
Pt arrived for appointment, reports she is really cold and didn't feel good.  Pt canceled today's session. °  °Whitney Bauer Whitney Bauer, PTA °  °

## 2020-01-27 NOTE — Progress Notes (Deleted)
Pt arrived for appointment, reports she is really cold and didn't feel good.  Pt canceled today's session.  Juel Burrow, PTA

## 2020-01-27 NOTE — Progress Notes (Signed)
Patient Whitney Bauer, female DOB:06-May-1955, 64 y.o. YQM:[id number]  Chief Complaint  Patient presents with  . Knee Pain    HPI  Whitney Bauer is a 64 y.o. female who has chronic pain of the knees.  She went to [facility] and they said she needed to lose over 100 pounds first. She is going to the [employer].  She is depressed.  She has pain in the knees.  She has no new trauma.  She has swelling.  She uses a cane.   Body mass index is 58.71 kg/m.  The patient meets the AMA guidelines for Morbid (severe) obesity with a BMI > 40.0 and I have recommended weight loss.   ROS  Review of Systems  HENT: Negative for congestion.   Respiratory: Negative for cough and shortness of breath.   Cardiovascular: Negative for chest pain and leg swelling.  Endocrine: Positive for cold intolerance.  Musculoskeletal: Positive for arthralgias, gait problem and joint swelling.  Allergic/Immunologic: Positive for environmental allergies.  All other systems reviewed and are negative.   All other systems reviewed and are negative.  The following is a summary of the past history medically, past history surgically, known current medicines, social history and family history.  This information is gathered electronically by the computer from prior information and documentation.  I review this each visit and have found including this information at this point in the chart is beneficial and informative.    Past Medical History:  Diagnosis Date  . Anemia   . Arthritis    bilat. knees  . Back pain   . Constipation   . Family history of adverse reaction to anesthesia    patients mother had cardiac issues during anesthesia  . GERD (gastroesophageal reflux disease)   . Hypertension   . Joint pain   . Lactose intolerance   . Rheumatoid arthritis (HCC)   . Swelling of both lower extremities     Past Surgical History:  Procedure Laterality Date  . BIOPSY  12/22/2019   Procedure: BIOPSY;   Surgeon: Malissa Hippo, MD;  [facility];  Service: Endoscopy;;  antral  . COLONOSCOPY N/A 01/06/2014   Procedure: COLONOSCOPY;  Surgeon: Malissa Hippo, MD;  [facility];  Service: Endoscopy;  Laterality: N/A;  830  . COLONOSCOPY WITH PROPOFOL N/A 12/22/2019   Procedure: COLONOSCOPY WITH PROPOFOL;  Surgeon: Malissa Hippo, MD;  [facility];  Service: Endoscopy;  Laterality: N/A;  930  . ESOPHAGOGASTRODUODENOSCOPY (EGD) WITH PROPOFOL N/A 12/22/2019   Procedure: ESOPHAGOGASTRODUODENOSCOPY (EGD) WITH PROPOFOL;  Surgeon: Malissa Hippo, MD;  [facility];  Service: Endoscopy;  Laterality: N/A;  . FOOT SURGERY    . PARTIAL HYSTERECTOMY  jan 1999    Family History  Problem Relation Age of Onset  . Asthma Mother   . Heart disease Mother   . Pancreatic cancer Mother   . Ovarian cancer Mother   . Diabetes Mother   . Stroke Mother   . Asthma Father   . Colon cancer Father   . Prostate cancer Father   . Stroke Father   . High blood pressure Father   . Asthma Sister   . Cancer Paternal Grandmother        mouth    Social History Social History   Tobacco Use  . Smoking status: Never Smoker  . Smokeless tobacco: Never Used  Vaping Use  . Vaping Use: Never used  Substance  Use Topics  . Alcohol use: No  . Drug use: Never    Allergies  Allergen Reactions  . Pantoprazole Other (See Comments)    Facial/mouth/jaw spasms (facial droop)    Current Outpatient Medications  Medication Sig Dispense Refill  . atenolol-chlorthalidone (TENORETIC) 50-25 MG tablet Take 1 tablet by mouth daily.    . cyclobenzaprine (FLEXERIL) 10 MG tablet Take 10 mg by mouth daily as needed for muscle spasms.     . diclofenac (VOLTAREN) 75 MG EC tablet TAKE 1 TABLET (75 MG TOTAL) BY MOUTH 2 (TWO) TIMES DAILY WITH A MEAL. (Patient taking differently: Take 75 mg by mouth daily. ) 60 tablet 5  . esomeprazole (NEXIUM) 40 MG capsule Take 40 mg by mouth daily.    Marland Kitchen  loperamide (IMODIUM) 1 MG/5ML solution Take 2-4 mg by mouth 4 (four) times daily as needed for diarrhea or loose stools.     . phentermine 15 MG capsule Take 15 mg by mouth daily.    Marland Kitchen trolamine salicylate (ASPERCREME) 10 % cream Apply 1-2 application topically 4 (four) times daily as needed for muscle pain.    . traMADol (ULTRAM) 50 MG tablet Take 50 mg by mouth every 6 (six) hours as needed. (Patient not taking: Reported on 01/27/2020)     No current facility-administered medications for this visit.     Physical Exam  Height 5\' 2"  (1.575 m), weight (!) 321 lb (145.6 kg).  Constitutional: overall normal hygiene, normal nutrition, well developed, normal grooming, normal body habitus. Assistive device:cane  Musculoskeletal: gait and station Limp right, muscle tone and strength are normal, no tremors or atrophy is present.  .  Neurological: coordination overall normal.  Deep tendon reflex/nerve stretch intact.  Sensation normal.  Cranial nerves II-XII intact.   Skin:   Normal overall no scars, lesions, ulcers or rashes. No psoriasis.  Psychiatric: Alert and oriented x 3.  Recent memory intact, remote memory unclear.  Normal mood and affect. Well groomed.  Good eye contact.  Cardiovascular: overall no swelling, no varicosities, no edema bilaterally, normal temperatures of the legs and arms, no clubbing, cyanosis and good capillary refill.  Lymphatic: palpation is normal.  Both knees are tender, effusion, crepitus, ROM 0 to 100.  Limp right.  Stable.  All other systems reviewed and are negative   The patient has been educated about the nature of the problem(s) and counseled on treatment options.  The patient appeared to understand what I have discussed and is in agreement with it.  Encounter Diagnoses  Name Primary?  . Chronic pain of left knee Yes  . Chronic pain of right knee   . Body mass index 50.0-59.9, adult (HCC)   . Morbid obesity due to excess calories [facility])      PLAN Call if any problems.  Precautions discussed.  Continue current medications.   Return to clinic 3 months   Electronically Signed [facility], MD 11/4/202111:45 AM

## 2020-02-01 ENCOUNTER — Telehealth (HOSPITAL_COMMUNITY): Payer: Self-pay

## 2020-02-01 ENCOUNTER — Ambulatory Visit (HOSPITAL_COMMUNITY): Payer: No Typology Code available for payment source
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
## 2020-02-02 ENCOUNTER — Ambulatory Visit (INDEPENDENT_AMBULATORY_CARE_PROVIDER_SITE_OTHER): Payer: No Typology Code available for payment source | Admitting: Bariatrics

## 2020-02-02 ENCOUNTER — Encounter (INDEPENDENT_AMBULATORY_CARE_PROVIDER_SITE_OTHER): Payer: Self-pay | Admitting: Bariatrics

## 2020-02-02 ENCOUNTER — Other Ambulatory Visit: Payer: Self-pay

## 2020-02-02 VITALS — BP 126/79 | HR 57 | Temp 98.6°F | Ht 62.0 in | Wt 313.0 lb

## 2020-02-02 DIAGNOSIS — Z9189 Other specified personal risk factors, not elsewhere classified: Secondary | ICD-10-CM

## 2020-02-02 DIAGNOSIS — M1990 Unspecified osteoarthritis, unspecified site: Secondary | ICD-10-CM

## 2020-02-02 DIAGNOSIS — E559 Vitamin D deficiency, unspecified: Secondary | ICD-10-CM | POA: Diagnosis not present

## 2020-02-02 DIAGNOSIS — Z0289 Encounter for other administrative examinations: Secondary | ICD-10-CM

## 2020-02-02 DIAGNOSIS — Z1331 Encounter for screening for depression: Secondary | ICD-10-CM | POA: Diagnosis not present

## 2020-02-02 DIAGNOSIS — R5383 Other fatigue: Secondary | ICD-10-CM | POA: Diagnosis not present

## 2020-02-02 DIAGNOSIS — I10 Essential (primary) hypertension: Secondary | ICD-10-CM

## 2020-02-02 DIAGNOSIS — R7309 Other abnormal glucose: Secondary | ICD-10-CM

## 2020-02-02 DIAGNOSIS — R0602 Shortness of breath: Secondary | ICD-10-CM

## 2020-02-02 DIAGNOSIS — E7800 Pure hypercholesterolemia, unspecified: Secondary | ICD-10-CM

## 2020-02-02 DIAGNOSIS — Z6841 Body Mass Index (BMI) 40.0 and over, adult: Secondary | ICD-10-CM

## 2020-02-02 NOTE — Progress Notes (Signed)
Chief Complaint:   OBESITY Whitney Bauer (MR# [id number]) is a 64 y.o. female who presents for evaluation and treatment of obesity and related comorbidities. Current BMI is Body mass index is 57.25 kg/m.Marland Kitchen Whitney Bauer has been struggling with her weight for many years and has been unsuccessful in either losing weight, maintaining weight loss, or reaching her healthy weight goal.  Whitney Bauer is currently in the action stage of change and ready to dedicate time achieving and maintaining a healthier weight. Whitney Bauer is interested in becoming our patient and working on intensive lifestyle modifications including (but not limited to) diet and exercise for weight loss.  Whitney Bauer likes to cook, but is limited due to her knees. She craves carbohydrates and steak. She states that's she is a "picky eater." She was here in 2018 and saw Whitney Rieger, PA-C.  Whitney Bauer's habits were reviewed today and are as follows: she struggles with family and or coworkers weight loss sabotage, her desired weight loss is 148 lbs, she has been heavy most of her life, she started gaining weight 25 years ago, her heaviest weight ever was 355 pounds, she is a picky eater and doesn't like to eat healthier foods, she craves steak, rice, crackers, chocolate bar, pineapple chunks, grapes, chips and dips, shrimp, and crab legs, she skips lunch (works 3rd shift), she is frequently drinking liquids with calories, she frequently makes poor food choices, she frequently eats larger portions than normal, she has binge eating behaviors and she struggles with emotional eating.  Depression Screen Whitney Bauer Food and Mood (modified PHQ-9) score was 11.  Depression screen PHQ 2/9 02/02/2020  Decreased Interest 3  Down, Depressed, Hopeless 1  PHQ - 2 Score 4  Altered sleeping 0  Tired, decreased energy 3  Change in appetite 1  Feeling bad or failure about yourself  0  Trouble concentrating 0  Moving slowly or fidgety/restless 3  Suicidal thoughts 0  PHQ-9 Score  11  Difficult doing work/chores Very difficult   Subjective:   Other fatigue. Whitney Bauer denies daytime somnolence and admits to waking up still tired. Whitney Bauer generally gets 4-5 hours of sleep per night, and states that she has generally restful sleep. Apneic episodes are not present. Epworth Sleepiness Score is 9.  SOB (shortness of breath) on exertion. Whitney Bauer notes increasing shortness of breath with exercising and seems to be worsening over time with weight gain. She notes getting out of breath sooner with activity than she used to. This has gotten worse recently. Whitney Bauer denies shortness of breath at rest or orthopnea.  Essential hypertension. Whitney Bauer is taking atenolol and chlorthalidone. Blood pressure is controlled.  BP Readings from Last 3 Encounters:  02/02/20 126/79  12/22/19 128/83  12/20/19 114/66   Lab Results  Component Value Date   CREATININE 0.84 12/20/2019   CREATININE 0.51 04/15/2011   CREATININE 0.68 02/19/2007   Other type of osteoarthritis, unspecified site. Whitney Bauer reports osteoarthritis in her knees, feet, and back. She is taking diclofenac, oxycodone-acetaminophen, and Tramadol.  Vitamin D deficiency. Whitney Bauer endorses sun exposure.   Ref. Range 04/08/2018 10:44  Vitamin D, 25-Hydroxy Latest Ref Range: 30.0 - 100.0 ng/mL 6.1 (L)   Elevated cholesterol. Whitney Bauer is on no medications.  Elevated glucose. Whitney Bauer reports having a normal appetite.  Depression screening. Whitney Bauer had a moderately positive depression screen with a PHQ-9 score of 11.  At risk for activity intolerance. Whitney Bauer is at risk of exercise intolerance due to osteoarthritis in knees and back.  Assessment/Plan:   Other  fatigue. Whitney Bauer does feel that her weight is causing her energy to be lower than it should be. Fatigue may be related to obesity, depression or many other causes. Labs will be ordered, and in the meanwhile, Whitney Bauer will focus on self care including making healthy food choices, increasing physical activity and  focusing on stress reduction. EKG 12-Lead, T3, T4, free, TSH, TSH+T4F+T3Free testing ordered today.  SOB (shortness of breath) on exertion. Whitney Bauer does feel that she gets out of breath more easily that she used to when she exercises. Whitney Bauer shortness of breath appears to be obesity related and exercise induced. She has agreed to work on weight loss and gradually increase exercise to treat her exercise induced shortness of breath. Will continue to monitor closely.   Essential hypertension. Whitney Bauer is working on healthy weight loss and exercise to improve blood pressure control. We will watch for signs of hypotension as she continues her lifestyle modifications. She will continue her medications as directed.   Other type of osteoarthritis, unspecified site. Whitney Bauer will continue her medications as directed.   Vitamin D deficiency. Low Vitamin D level contributes to fatigue and are associated with obesity, breast, and colon cancer. VITAMIN D 25 Hydroxy (Vit-D Deficiency, Fractures) level will be checked today.   Elevated cholesterol.  Lipid Panel With LDL/HDL Ratio will be checked today.  Elevated glucose.  Insulin, random, Hemoglobin A1c levels will be checked today.  Depression screening. Whitney Bauer had a positive depression screening. Depression is commonly associated with obesity and often results in emotional eating behaviors. We will monitor this closely and work on CBT to help improve the non-hunger eating patterns. Referral to Psychology may be required if no improvement is seen as she continues in our clinic.  At risk for activity intolerance. Whitney Bauer was given approximately 15 minutes of exercise intolerance counseling today. She is 64 y.o. female and has risk factors exercise intolerance including obesity. We discussed intensive lifestyle modifications today with an emphasis on specific weight loss instructions and strategies. Whitney Bauer will slowly increase activity as tolerated.  Repetitive spaced learning was  employed today to elicit superior memory formation and behavioral change.  Class 3 severe obesity with serious comorbidity and body mass index (BMI) of 50.0 to 59.9 in adult, unspecified obesity type (HCC).   Whitney Bauer is currently in the action stage of change and her goal is to continue with weight loss efforts. I recommend Whitney Bauer begin the structured treatment plan as follows:  She has agreed to the Category 2 Plan + 100 calories.   She will work on meal planning, intentional eating, stopping all sugary drinks, and avoiding bread.  We reviewed with the patient labs from 12/20/2019 including CMP, CBC, and glucose.  Exercise goals: All adults should avoid inactivity. Some physical activity is better than none, and adults who participate in any amount of physical activity gain some health benefits.   Behavioral modification strategies: increasing lean protein intake, decreasing simple carbohydrates, increasing vegetables, increasing water intake, decreasing eating out, no skipping meals, meal planning and cooking strategies, keeping healthy foods in the home and planning for success.  She was informed of the importance of frequent follow-up visits to maximize her success with intensive lifestyle modifications for her multiple health conditions. She was informed we would discuss her lab results at her next visit unless there is a critical issue that needs to be addressed sooner. Shatonya agreed to keep her next visit at the agreed upon time to discuss these results.  Objective:   Blood  pressure 126/79, pulse (!) 57, temperature 98.6 F (37 C), temperature source Oral, height 5\' 2"  (1.575 m), weight (!) 313 lb (142 kg), SpO2 100 %. Body mass index is 57.25 kg/m.  EKG: Sinus  Bradycardia with a rate of 58 BPM. Poor R-wave progression - nonspecific - may be related to body habitus. Borderline.    Indirect Calorimeter completed today shows a VO2 of 266 and a REE of 1854.  Her calculated basal metabolic  rate is 1721 thus her basal metabolic rate is better than expected.  General: Cooperative, alert, well developed, in no acute distress. HEENT: Conjunctivae and lids unremarkable. Cardiovascular: Regular rhythm.  Lungs: Normal work of breathing. Neurologic: No focal deficits. Pt ambulates with a cane. Extremities: Edema was noted in lower extremities.  Lab Results  Component Value Date   CREATININE 0.84 12/20/2019   BUN 21 12/20/2019   NA 139 12/20/2019   K 3.4 (L) 12/20/2019   CL 103 12/20/2019   CO2 28 12/20/2019   Lab Results  Component Value Date   ALT 16 04/15/2011   AST 13 04/15/2011   ALKPHOS 67 04/15/2011   BILITOT 0.5 04/15/2011   Lab Results  Component Value Date   HGBA1C 5.4 04/08/2018   Lab Results  Component Value Date   INSULIN 4.4 04/08/2018   No results found for: TSH No results found for: CHOL, HDL, LDLCALC, LDLDIRECT, TRIG, CHOLHDL Lab Results  Component Value Date   WBC 4.5 12/20/2019   HGB 12.2 12/20/2019   HCT 37.6 12/20/2019   MCV 90.4 12/20/2019   PLT 216 12/20/2019   No results found for: IRON, TIBC, FERRITIN  Attestation Statements:   Reviewed by clinician on day of visit: allergies, medications, problem list, medical history, surgical history, family history, social history, and previous encounter notes.  12/22/2019, am acting as Fernanda Drum for Energy manager, DO   I have reviewed the above documentation for accuracy and completeness, and I agree with the above. [employer], DO

## 2020-02-03 ENCOUNTER — Ambulatory Visit (HOSPITAL_COMMUNITY): Payer: No Typology Code available for payment source | Admitting: Physical Therapy

## 2020-02-03 ENCOUNTER — Encounter (HOSPITAL_COMMUNITY): Payer: Self-pay | Admitting: Physical Therapy

## 2020-02-03 DIAGNOSIS — R29898 Other symptoms and signs involving the musculoskeletal system: Secondary | ICD-10-CM

## 2020-02-03 DIAGNOSIS — M542 Cervicalgia: Secondary | ICD-10-CM | POA: Diagnosis not present

## 2020-02-03 DIAGNOSIS — M6281 Muscle weakness (generalized): Secondary | ICD-10-CM

## 2020-02-03 LAB — LIPID PANEL WITH LDL/HDL RATIO
Cholesterol, Total: 242 mg/dL — ABNORMAL HIGH (ref 100–199)
HDL: 104 mg/dL (ref >39)
LDL Chol Calc (NIH): 130 mg/dL — ABNORMAL HIGH (ref 0–99)
LDL/HDL Ratio: 1.3 ratio (ref 0.0–3.2)
Triglycerides: 48 mg/dL (ref 0–149)
VLDL Cholesterol Cal: 8 mg/dL (ref 5–40)

## 2020-02-03 LAB — INSULIN, RANDOM: INSULIN: 5.3 u[IU]/mL (ref 2.6–24.9)

## 2020-02-03 LAB — T3: T3, Total: 92 ng/dL (ref 71–180)

## 2020-02-03 LAB — HEMOGLOBIN A1C
Est. average glucose Bld gHb Est-mCnc: 114 mg/dL
Hgb A1c MFr Bld: 5.6 % (ref 4.8–5.6)

## 2020-02-03 LAB — TSH+T4F+T3FREE
Free T4: 1.63 ng/dL (ref 0.82–1.77)
T3, Free: 2.9 pg/mL (ref 2.0–4.4)
TSH: 1.65 u[IU]/mL (ref 0.450–4.500)

## 2020-02-03 LAB — VITAMIN D 25 HYDROXY (VIT D DEFICIENCY, FRACTURES): Vit D, 25-Hydroxy: 9.5 ng/mL — ABNORMAL LOW (ref 30.0–100.0)

## 2020-02-03 NOTE — Therapy (Signed)
[facility] [address] [place], [place], [address] Phone: [phone]   Fax:  [phone]  Physical Therapy Treatment and Progress Note and Discharge Note  Patient Details  Name: Whitney Bauer MRN: [id number] Date of Birth: 07/24/1955 Referring Provider (PT): Kristeen Miss, MD    PHYSICAL THERAPY DISCHARGE SUMMARY  Visits from Start of Care:4  Current functional level related to goals / functional outcomes: 1/2 STG met and 2/3 LTG met   Remaining deficits: Continued weakness in left shoulder, no neck symptoms   Education / Equipment: See below  Plan: Patient agrees to discharge.  Patient goals were partially met. Patient is being discharged due to being pleased with the current functional level.  ?????       Progress Note Reporting Period 12/29/19 to 02/03/20  See note below for Objective Data and Assessment of Progress/Goals.       Encounter Date: 02/03/2020   PT End of Session - 02/03/20 1136    Visit Number 4    Number of Visits 10    Date for PT Re-Evaluation 02/02/20    Authorization Type [employer]; no auth required; 60 visit limit PT/OT/ST)    Progress Note Due on Visit 10    PT Start Time 1131    PT Stop Time 1205    PT Time Calculation (min) 34 min    Activity Tolerance Patient tolerated treatment well;No increased pain;Patient limited by fatigue    Behavior During Therapy [facility] for tasks assessed/performed           Past Medical History:  Diagnosis Date  . Anemia   . Arthritis    bilat. knees  . Back pain   . Back pain   . Constipation   . Edema of both lower extremities   . Family history of adverse reaction to anesthesia    patients mother had cardiac issues during anesthesia  . Food allergy   . GERD (gastroesophageal reflux disease)   . Hypertension   . Joint pain   . Joint pain   . Lactose intolerance   . Rheumatoid arthritis ([place])   . Swelling of both lower extremities     . Vitamin D deficiency     Past Surgical History:  Procedure Laterality Date  . BIOPSY  12/22/2019   Procedure: BIOPSY;  Surgeon: Rogene Houston, MD;  [facility]: [facility];  Service: Endoscopy;;  antral  . COLONOSCOPY N/A 01/06/2014   Procedure: COLONOSCOPY;  Surgeon: Rogene Houston, MD;  [facility];  Service: Endoscopy;  Laterality: N/A;  830  . COLONOSCOPY WITH PROPOFOL N/A 12/22/2019   Procedure: COLONOSCOPY WITH PROPOFOL;  Surgeon: Rogene Houston, MD;  [facility]: [facility];  Service: Endoscopy;  Laterality: N/A;  930  . ESOPHAGOGASTRODUODENOSCOPY (EGD) WITH PROPOFOL N/A 12/22/2019   Procedure: ESOPHAGOGASTRODUODENOSCOPY (EGD) WITH PROPOFOL;  Surgeon: Rogene Houston, MD;  [facility];  Service: Endoscopy;  Laterality: N/A;  . FOOT SURGERY    . PARTIAL HYSTERECTOMY  jan 1999    There were no vitals filed for this visit.   Subjective Assessment - 02/03/20 1212    Subjective Patient reports her neck feels 100% better and her left shoulder feels 50% better. States she is trying to do her exercises but some times life gets in the way. States she liked stretching out her back a lot last session. Reports she sees her neck MD tomorrow. States she feels she is good in  regards to therapy concerning her neck and shoulder but would like to start therapy for her balance. States this would be ordered from a different MD.    Currently in Pain? No/denies              [facility] PT Assessment - 02/03/20 0001      Assessment   Medical Diagnosis Cervicalgia    Referring Provider (PT) Kristeen Miss, MD    Next MD Visit 02/04/20      Observation/Other Assessments   Focus on Therapeutic Outcomes (FOTO)  72% function    was 57% function      AROM   AROM Assessment Site Cervical;Shoulder    Right/Left Shoulder Right;Left    Right Shoulder Flexion 170 Degrees    Right Shoulder ABduction 170 Degrees    Right Shoulder Internal Rotation --   reaches to T6 SP    Right  Shoulder External Rotation --   reaches to T1 SP    Left Shoulder Flexion 150 Degrees    Left Shoulder ABduction 170 Degrees    Left Shoulder Internal Rotation --   reaches to T6 SP    Left Shoulder External Rotation --   reaches to left upper border of scapula   Cervical Flexion WNL   no pain   Cervical Extension WNL   no pain   Cervical - Right Side Bend WNL   WNL   Cervical - Left Side Bend WNL   WNL   Cervical - Right Rotation WNL   no pain    Cervical - Left Rotation WNL   no pain     Strength   Right Shoulder Flexion 4/5    Right Shoulder ABduction 4-/5    Right Shoulder Internal Rotation 4/5    Right Shoulder External Rotation 4/5    Left Shoulder Flexion 3+/5    Left Shoulder ABduction 3+/5    Left Shoulder Internal Rotation 4/5    Left Shoulder External Rotation 3+/5                         [facility] Adult PT Treatment/Exercise - 02/03/20 0001      Shoulder Exercises: Supine   Other Supine Exercises book stretch - too challenging, SKC with towel x5 B, lumbar ROT x10 B       Shoulder Exercises: Seated   Other Seated Exercises trunk ROT, trunk extension x10 5" holds B                   PT Education - 02/03/20 1209    Education Details on HEP, on progress, on FOTO score and plan moving forward    Person(s) Educated Patient    Methods Explanation    Comprehension Verbalized understanding            PT Short Term Goals - 02/03/20 1142      PT SHORT TERM GOAL #1   Title Patient will report understanding and regular compliance with HEP to improve strength, mobility and decrease pain.    Time 2    Period Weeks    Status On-going    Target Date 01/12/20      PT SHORT TERM GOAL #2   Title Patient will report improvement in overall subjective complaint of at least 25% for improved QOL.    Time 2    Period Weeks    Status Achieved    Target Date 01/12/20  PT Long Term Goals - 02/03/20 1141      PT LONG TERM GOAL #1   Title  Patient will report improvement in overall subjective complaint of at least 50% for improved QOL.    Time 5    Period Weeks    Status Achieved      PT LONG TERM GOAL #2   Title Patient will demonstrate left shoulder strength of at least 4/5 in all planes of movement tested as deficient evaluation, in order to improve ease of reaching to place dishes overhead with both hands.    Time 5    Period Weeks    Status On-going      PT LONG TERM GOAL #3   Title Patient will demonstrate full pain-free cervical AROM for improved ease of observing the environment.    Time 5    Period Weeks    Status Achieved                 Plan - 02/03/20 1210    Clinical Impression Statement Patient present for a reassessment on this date. She has made good progress in ROM, goals and FOTO score. Continued strength deficits noted bilaterally but patient is confident in current HEP to continue to work towards her remaining goals. Patient to discharge from PT at this time secondary to satisfaction with progress made in regards to her neck and shoulder.    Personal Factors and Comorbidities Time since onset of injury/illness/exacerbation;Comorbidity 2    Comorbidities HTN; obesity    Examination-Activity Limitations Lift;Reach Overhead    Examination-Participation Restrictions Community Activity;Laundry;Shop;Meal Prep;Cleaning    Stability/Clinical Decision Making Stable/Uncomplicated    Rehab Potential Fair    PT Frequency 2x / week    PT Duration --   5 weeks   PT Treatment/Interventions ADLs/Self Care Home Management;Cryotherapy;Electrical Stimulation;Moist Heat;Functional mobility training;Therapeutic activities;Therapeutic exercise;Patient/family education;Manual techniques;Passive range of motion;Dry needling;Energy conservation;Taping    PT Next Visit Plan pt to DC from PT to HEP at this time. interested in getting order from different MD for balance in future    PT Home Exercise Plan 12/29/19: table  slides flexion and abduction, scapular retraction; 01/20/20: modified: AAROM with UE flexion and abduction wiht cane supine then progress to sitting wiht good posture, cervical and scapular retraction           Patient will benefit from skilled therapeutic intervention in order to improve the following deficits and impairments:  Pain, Decreased mobility, Decreased activity tolerance, Decreased endurance, Decreased range of motion, Decreased strength, Hypomobility, Obesity  Visit Diagnosis: Cervicalgia  Muscle weakness (generalized)  Other symptoms and signs involving the musculoskeletal system     Problem List Patient Active Problem List   Diagnosis Date Noted  . Vitamin D deficiency 05/06/2018  . Gastroesophageal reflux disease 04/13/2018  . Essential hypertension 04/13/2018  . Class 3 severe obesity with serious comorbidity and body mass index (BMI) of 50.0 to 59.9 in adult [facility]) 04/13/2018  . Derangement of posterior horn of medial meniscus of right knee 09/10/2016  . Chronic pain of right knee 09/10/2016  . DOE (dyspnea on exertion) 01/28/2011  . Muscle weakness (generalized) 10/23/2010    12:16 PM, 02/03/20 Jerene Pitch, DPT Physical Therapy with [facility]  [phone] office   [facility] [address] [place], [place], [address] Phone: [phone]   Fax:  [phone]  Name: Whitney Bauer MRN: [id number] Date of Birth: [birth year]

## 2020-02-08 ENCOUNTER — Ambulatory Visit (HOSPITAL_COMMUNITY): Payer: No Typology Code available for payment source | Admitting: Physical Therapy

## 2020-02-10 ENCOUNTER — Ambulatory Visit (HOSPITAL_COMMUNITY): Payer: No Typology Code available for payment source | Admitting: Physical Therapy

## 2020-02-15 ENCOUNTER — Encounter (HOSPITAL_COMMUNITY): Payer: No Typology Code available for payment source

## 2020-02-16 ENCOUNTER — Ambulatory Visit (INDEPENDENT_AMBULATORY_CARE_PROVIDER_SITE_OTHER): Payer: No Typology Code available for payment source | Admitting: Bariatrics

## 2020-02-21 ENCOUNTER — Encounter (INDEPENDENT_AMBULATORY_CARE_PROVIDER_SITE_OTHER): Payer: Self-pay | Admitting: *Deleted

## 2020-02-21 ENCOUNTER — Encounter (HOSPITAL_COMMUNITY): Payer: No Typology Code available for payment source | Admitting: Physical Therapy

## 2020-02-23 ENCOUNTER — Ambulatory Visit (INDEPENDENT_AMBULATORY_CARE_PROVIDER_SITE_OTHER): Payer: No Typology Code available for payment source | Admitting: Bariatrics

## 2020-02-23 ENCOUNTER — Encounter (INDEPENDENT_AMBULATORY_CARE_PROVIDER_SITE_OTHER): Payer: Self-pay | Admitting: Bariatrics

## 2020-02-23 ENCOUNTER — Other Ambulatory Visit: Payer: Self-pay

## 2020-02-23 ENCOUNTER — Encounter (HOSPITAL_COMMUNITY): Payer: No Typology Code available for payment source

## 2020-02-23 VITALS — BP 113/73 | HR 63 | Temp 98.4°F | Ht 62.0 in | Wt 313.0 lb

## 2020-02-23 DIAGNOSIS — Z6841 Body Mass Index (BMI) 40.0 and over, adult: Secondary | ICD-10-CM | POA: Diagnosis not present

## 2020-02-23 DIAGNOSIS — E7800 Pure hypercholesterolemia, unspecified: Secondary | ICD-10-CM

## 2020-02-23 DIAGNOSIS — Z9189 Other specified personal risk factors, not elsewhere classified: Secondary | ICD-10-CM | POA: Diagnosis not present

## 2020-02-23 DIAGNOSIS — E559 Vitamin D deficiency, unspecified: Secondary | ICD-10-CM

## 2020-02-23 MED ORDER — VITAMIN D (ERGOCALCIFEROL) 1.25 MG (50000 UNIT) PO CAPS: 50000.0000 [IU] | ORAL_CAPSULE | ORAL | 0 refills | Status: DC

## 2020-02-24 NOTE — Progress Notes (Signed)
Chief Complaint:   OBESITY Whitney Bauer is here to discuss her progress with her obesity treatment plan along with follow-up of her obesity related diagnoses. Whitney Bauer is on the Category 2 Plan + 100 calories and states she is following her eating plan approximately 0% of the time. Whitney Bauer states she is exercising 0 minutes 0 times per week.  Today's visit was #: 6 Starting weight: 336 lbs Starting date: 04/08/2018 Today's weight: 313 lbs Today's date: 02/23/2020 Total lbs lost to date: 23 Total lbs lost since last in-office visit: 0  Interim History: [name]'s weight remains the same since her last visit.  Subjective:   Vitamin D deficiency. Whitney Bauer is not on Vitamin D supplementation.    Ref. Range 02/02/2020 11:54  Vitamin D, 25-Hydroxy Latest Ref Range: 30.0 - 100.0 ng/mL 9.5 (L)   Elevated cholesterol. Whitney Bauer is on no medication. Last labs showed total cholesterol 242, HDL elevated at 104, and LDL elevated at 130.   Ref. Range 02/02/2020 11:54  Cholesterol, Total Latest Ref Range: 100 - 199 mg/dL 245 (H)   At risk for osteoporosis. Whitney Bauer is at higher risk of osteopenia and osteoporosis due to Vitamin D deficiency.   Assessment/Plan:   Vitamin D deficiency. Low Vitamin D level contributes to fatigue and are associated with obesity, breast, and colon cancer. She was given a prescription for Vitamin D, Ergocalciferol, (DRISDOL) 1.25 MG (50000 UNIT) CAPS capsule every week #4 with 0 refills and will follow-up for routine testing of Vitamin D, at least 2-3 times per year to avoid over-replacement.   Elevated cholesterol. Whitney Bauer will avoid trans fats, minimize saturated fats, and increase PUFA's and MUFA's.  At risk for osteoporosis. Whitney Bauer was given approximately 15 minutes of osteoporosis prevention counseling today. Whitney Bauer is at risk for osteopenia and osteoporosis due to her Vitamin D deficiency. She was encouraged to take her Vitamin D and follow her higher calcium diet and increase  strengthening exercise to help strengthen her bones and decrease her risk of osteopenia and osteoporosis.  Repetitive spaced learning was employed today to elicit superior memory formation and behavioral change.  Class 3 severe obesity with serious comorbidity and body mass index (BMI) of 50.0 to 59.9 in adult, unspecified obesity type (HCC).  Whitney Bauer is currently in the action stage of change. As such, her goal is to continue with weight loss efforts. She has agreed to the Category 2 Plan + 100 calories.   We reviewed with the patient labs from 02/02/2020 including lipid panel, Vitamin D, A1c, insulin, and thyroid panel.  Handout was provided on Homemade Seasonings and Store Bought Seasonings.  Exercise goals: All adults should avoid inactivity. Some physical activity is better than none, and adults who participate in any amount of physical activity gain some health benefits.  Behavioral modification strategies: increasing lean protein intake, decreasing simple carbohydrates, increasing vegetables, increasing water intake, decreasing eating out, no skipping meals, meal planning and cooking strategies, keeping healthy foods in the home and planning for success.  Whitney Bauer has agreed to follow-up with our clinic in 2 weeks. She was informed of the importance of frequent follow-up visits to maximize her success with intensive lifestyle modifications for her multiple health conditions.   Objective:   Blood pressure 113/73, pulse 63, temperature 98.4 F (36.9 C), height 5\' 2"  (1.575 m), weight (!) 313 lb (142 kg), SpO2 100 %. Body mass index is 57.25 kg/m.  General: Cooperative, alert, well developed, in no acute distress. HEENT: Conjunctivae and lids  unremarkable. Cardiovascular: Regular rhythm.  Lungs: Normal work of breathing. Neurologic: No focal deficits. Pt is using a cane for ambulation.  Lab Results  Component Value Date   CREATININE 0.84 12/20/2019   BUN 21 12/20/2019   NA 139  12/20/2019   K 3.4 (L) 12/20/2019   CL 103 12/20/2019   CO2 28 12/20/2019   Lab Results  Component Value Date   ALT 16 04/15/2011   AST 13 04/15/2011   ALKPHOS 67 04/15/2011   BILITOT 0.5 04/15/2011   Lab Results  Component Value Date   HGBA1C 5.6 02/02/2020   HGBA1C 5.4 04/08/2018   Lab Results  Component Value Date   INSULIN 5.3 02/02/2020   INSULIN 4.4 04/08/2018   Lab Results  Component Value Date   TSH 1.650 02/02/2020   Lab Results  Component Value Date   CHOL 242 (H) 02/02/2020   HDL 104 02/02/2020   LDLCALC 130 (H) 02/02/2020   TRIG 48 02/02/2020   Lab Results  Component Value Date   WBC 4.5 12/20/2019   HGB 12.2 12/20/2019   HCT 37.6 12/20/2019   MCV 90.4 12/20/2019   PLT 216 12/20/2019   No results found for: IRON, TIBC, FERRITIN  Attestation Statements:   Reviewed by clinician on day of visit: allergies, medications, problem list, medical history, surgical history, family history, social history, and previous encounter notes.  Fernanda Drum, am acting as Energy manager for [employer], DO   I have reviewed the above documentation for accuracy and completeness, and I agree with the above. Corinna Capra, DO

## 2020-02-29 ENCOUNTER — Encounter (HOSPITAL_COMMUNITY): Payer: No Typology Code available for payment source

## 2020-03-02 ENCOUNTER — Encounter (HOSPITAL_COMMUNITY): Payer: No Typology Code available for payment source | Admitting: Physical Therapy

## 2020-03-15 ENCOUNTER — Ambulatory Visit (INDEPENDENT_AMBULATORY_CARE_PROVIDER_SITE_OTHER): Payer: No Typology Code available for payment source | Admitting: Bariatrics

## 2020-03-30 ENCOUNTER — Other Ambulatory Visit: Payer: Self-pay

## 2020-03-30 ENCOUNTER — Encounter (INDEPENDENT_AMBULATORY_CARE_PROVIDER_SITE_OTHER): Payer: Self-pay | Admitting: Bariatrics

## 2020-03-30 ENCOUNTER — Ambulatory Visit (INDEPENDENT_AMBULATORY_CARE_PROVIDER_SITE_OTHER): Payer: No Typology Code available for payment source | Admitting: Bariatrics

## 2020-03-30 VITALS — BP 125/80 | HR 66 | Temp 98.6°F | Ht 62.0 in | Wt 313.0 lb

## 2020-03-30 DIAGNOSIS — E559 Vitamin D deficiency, unspecified: Secondary | ICD-10-CM

## 2020-03-30 DIAGNOSIS — Z9189 Other specified personal risk factors, not elsewhere classified: Secondary | ICD-10-CM | POA: Diagnosis not present

## 2020-03-30 DIAGNOSIS — I10 Essential (primary) hypertension: Secondary | ICD-10-CM

## 2020-03-30 DIAGNOSIS — Z6841 Body Mass Index (BMI) 40.0 and over, adult: Secondary | ICD-10-CM | POA: Diagnosis not present

## 2020-03-30 MED ORDER — VITAMIN D (ERGOCALCIFEROL) 1.25 MG (50000 UNIT) PO CAPS: 50000.0000 [IU] | ORAL_CAPSULE | ORAL | 0 refills | Status: DC

## 2020-04-04 NOTE — Progress Notes (Signed)
Chief Complaint:   OBESITY Whitney Bauer is here to discuss her progress with her obesity treatment plan along with follow-up of her obesity related diagnoses. Whitney Bauer is on the Category 2 Plan and states she is following her eating plan approximately 10% of the time. Whitney Bauer states she is not exercising regularly at this time.  Today's visit was #: 7 Starting weight: 336 lbs Starting date: 04/08/2018 Today's weight: 313 lbs Today's date: 03/30/2020 Total lbs lost to date: 23 lbs Total lbs lost since last in-office visit: 0  Interim History: Whitney Bauer's weight remains the same, but she has done well overall.  She did her own thing over the holiday.  Subjective:   1. Essential hypertension Controlled.  Review: taking medications as instructed, no medication side effects noted, no chest pain on exertion, no dyspnea on exertion, no swelling of ankles.    BP Readings from Last 3 Encounters:  03/30/20 125/80  02/23/20 113/73  02/02/20 126/79   2. Vitamin D deficiency Whitney Bauer's Vitamin D level was 9.5 on 02/02/2020. She is currently taking prescription vitamin D 50,000 IU each week. She denies nausea, vomiting or muscle weakness.  3. At risk for heart disease Edit was given approximately 15 minutes of coronary artery disease prevention counseling today. She is 65 y.o. female and has risk factors for heart disease including obesity. We discussed intensive lifestyle modifications today with an emphasis on specific weight loss instructions and strategies.   Repetitive spaced learning was employed today to elicit superior memory formation and behavioral change.  Assessment/Plan:   1. Essential hypertension Rabia is working on healthy weight loss and exercise to improve blood pressure control. We will watch for signs of hypotension as she continues her lifestyle modifications.  Continue medication.  2. Vitamin D deficiency Low Vitamin D level contributes to fatigue and are associated with obesity, breast,  and colon cancer. She agrees to continue to take prescription Vitamin D @50 ,000 IU every week and will follow-up for routine testing of Vitamin D, at least 2-3 times per year to avoid over-replacement.  -Refill Vitamin D, Ergocalciferol, (DRISDOL) 1.25 MG (50000 UNIT) CAPS capsule; Take 1 capsule (50,000 Units total) by mouth every 7 (seven) days.  Dispense: 4 capsule; Refill: 0  3. At risk for heart disease Whitney Bauer was given approximately 15 minutes of coronary artery disease prevention counseling today. She is 65 y.o. female and has risk factors for heart disease including obesity. We discussed intensive lifestyle modifications today with an emphasis on specific weight loss instructions and strategies.   Repetitive spaced learning was employed today to elicit superior memory formation and behavioral change.  4. Class 3 severe obesity with serious comorbidity and body mass index (BMI) of 50.0 to 59.9 in adult, unspecified obesity type (HCC)  Whitney Bauer is currently in the action stage of change. As such, her goal is to continue with weight loss efforts. She has agreed to the Category 2 Plan.   Syliva will work on meal planning, intentional eating, and protein sources.  Exercise goals: All adults should avoid inactivity. Some physical activity is better than none, and adults who participate in any amount of physical activity gain some health benefits.  Behavioral modification strategies: increasing lean protein intake, decreasing simple carbohydrates, increasing vegetables, increasing water intake, decreasing eating out, no skipping meals, meal planning and cooking strategies, keeping healthy foods in the home and planning for success.  Whitney Bauer has agreed to follow-up with our clinic in 2-3 weeks. She was informed of the importance  of frequent follow-up visits to maximize her success with intensive lifestyle modifications for her multiple health conditions.   Objective:   Blood pressure 125/80, pulse 66,  temperature 98.6 F (37 C), temperature source Oral, height 5\' 2"  (1.575 m), weight (!) 313 lb (142 kg), SpO2 99 %. Body mass index is 57.25 kg/m.  General: Cooperative, alert, well developed, in no acute distress. HEENT: Conjunctivae and lids unremarkable. Cardiovascular: Regular rhythm.  Lungs: Normal work of breathing. Neurologic: No focal deficits.  She is walking with a cane today.  Lab Results  Component Value Date   CREATININE 0.84 12/20/2019   BUN 21 12/20/2019   NA 139 12/20/2019   K 3.4 (L) 12/20/2019   CL 103 12/20/2019   CO2 28 12/20/2019   Lab Results  Component Value Date   ALT 16 04/15/2011   AST 13 04/15/2011   ALKPHOS 67 04/15/2011   BILITOT 0.5 04/15/2011   Lab Results  Component Value Date   HGBA1C 5.6 02/02/2020   HGBA1C 5.4 04/08/2018   Lab Results  Component Value Date   INSULIN 5.3 02/02/2020   INSULIN 4.4 04/08/2018   Lab Results  Component Value Date   TSH 1.650 02/02/2020   Lab Results  Component Value Date   CHOL 242 (H) 02/02/2020   HDL 104 02/02/2020   LDLCALC 130 (H) 02/02/2020   TRIG 48 02/02/2020   Lab Results  Component Value Date   WBC 4.5 12/20/2019   HGB 12.2 12/20/2019   HCT 37.6 12/20/2019   MCV 90.4 12/20/2019   PLT 216 12/20/2019   Attestation Statements:   Reviewed by clinician on day of visit: allergies, medications, problem list, medical history, surgical history, family history, social history, and previous encounter notes.  I, 12/22/2019, CMA, am acting as Insurance claims handler for Energy manager, DO  I have reviewed the above documentation for accuracy and completeness, and I agree with the above. [employer], DO

## 2020-04-05 ENCOUNTER — Encounter (INDEPENDENT_AMBULATORY_CARE_PROVIDER_SITE_OTHER): Payer: Self-pay | Admitting: Bariatrics

## 2020-04-13 ENCOUNTER — Telehealth (INDEPENDENT_AMBULATORY_CARE_PROVIDER_SITE_OTHER): Payer: No Typology Code available for payment source | Admitting: Bariatrics

## 2020-04-13 ENCOUNTER — Encounter (INDEPENDENT_AMBULATORY_CARE_PROVIDER_SITE_OTHER): Payer: Self-pay | Admitting: Bariatrics

## 2020-04-13 DIAGNOSIS — E559 Vitamin D deficiency, unspecified: Secondary | ICD-10-CM

## 2020-04-13 DIAGNOSIS — Z6841 Body Mass Index (BMI) 40.0 and over, adult: Secondary | ICD-10-CM

## 2020-04-13 DIAGNOSIS — I10 Essential (primary) hypertension: Secondary | ICD-10-CM | POA: Diagnosis not present

## 2020-04-13 MED ORDER — VITAMIN D (ERGOCALCIFEROL) 1.25 MG (50000 UNIT) PO CAPS: 50000.0000 [IU] | ORAL_CAPSULE | ORAL | 0 refills | Status: DC

## 2020-04-17 ENCOUNTER — Encounter (INDEPENDENT_AMBULATORY_CARE_PROVIDER_SITE_OTHER): Payer: Self-pay | Admitting: Bariatrics

## 2020-04-17 NOTE — Progress Notes (Addendum)
TeleHealth Visit:  Due to the COVID-19 pandemic, this visit was completed with telemedicine (audio/video) technology to reduce patient and provider exposure as well as to preserve personal protective equipment.   Whitney Bauer has verbally consented to this TeleHealth visit. The patient is located at home, the provider is located at the [facility] office. The participants in this visit include the listed provider and patient. The visit was conducted today via telephone.  Whitney Bauer was unable to use realtime audiovisual technology today and the telehealth visit was conducted via telephone. The time spent on the visit including pre-visit review and post-visit care was 20 minutes.   Chief Complaint: OBESITY Whitney Bauer is here to discuss her progress with her obesity treatment plan along with follow-up of her obesity related diagnoses. Whitney Bauer is on the Category 2 Plan and states she is following her eating plan approximately 30% of the time. Whitney Bauer states she is not exercising at this time.  Today's visit was #: 8 Starting weight: 336 lbs Starting date: 04/08/2018  Interim History: Whitney Bauer states that she has maintained her weight.  She is doing great with her water intake.  Subjective:   1. Vitamin D deficiency [name]'s Vitamin D level was 9.5 on 02/02/2020. She is currently taking prescription vitamin D 50,000 IU each week. She denies nausea, vomiting or muscle weakness.    2. Essential hypertension Review: taking medications as instructed, no medication side effects noted, no chest pain on exertion, no dyspnea on exertion, no swelling of ankles.  She is taking her medications as prescribed.  BP Readings from Last 3 Encounters:  03/30/20 125/80  02/23/20 113/73  02/02/20 126/79   Assessment/Plan:   1. Vitamin D deficiency Low Vitamin D level contributes to fatigue and are associated with obesity, breast, and colon cancer. She agrees to continue to take prescription Vitamin D @50 ,000 IU every  week and will follow-up for routine testing of Vitamin D, at least 2-3 times per year to avoid over-replacement.  -Refill Vitamin D, Ergocalciferol, (DRISDOL) 1.25 MG (50000 UNIT) CAPS capsule; Take 1 capsule (50,000 Units total) by mouth every 7 (seven) days.  Dispense: 4 capsule; Refill: 0  2. Essential hypertension Whitney Bauer is working on healthy weight loss and exercise to improve blood pressure control. We will watch for signs of hypotension as she continues her lifestyle modifications.  Continue medications.  3. Class 3 severe obesity with serious comorbidity and body mass index (BMI) of 50.0 to 59.9 in adult, unspecified obesity type (HCC)  Whitney Bauer is currently in the action stage of change. As such, her goal is to continue with weight loss efforts. She has agreed to the Category 2 Plan.   She will work on meal planning and mindful eating.  Exercise goals: All adults should avoid inactivity. Some physical activity is better than none, and adults who participate in any amount of physical activity gain some health benefits.  Behavioral modification strategies: increasing lean protein intake, decreasing simple carbohydrates, increasing vegetables, increasing water intake, decreasing eating out, no skipping meals, meal planning and cooking strategies, keeping healthy foods in the home and planning for success.  Whitney Bauer has agreed to follow-up with our clinic in 2-3 weeks. She was informed of the importance of frequent follow-up visits to maximize her success with intensive lifestyle modifications for her multiple health conditions.  Objective:   VITALS: Per patient if applicable, see vitals. GENERAL: Alert and in no acute distress. CARDIOPULMONARY: No increased WOB. Speaking in clear sentences.  PSYCH: Pleasant and  cooperative. Speech normal rate and rhythm. Affect is appropriate. Insight and judgement are appropriate. Attention is focused, linear, and appropriate.  NEURO: Oriented as arrived to  appointment on time with no prompting.   Lab Results  Component Value Date   CREATININE 0.84 12/20/2019   BUN 21 12/20/2019   NA 139 12/20/2019   K 3.4 (L) 12/20/2019   CL 103 12/20/2019   CO2 28 12/20/2019   Lab Results  Component Value Date   ALT 16 04/15/2011   AST 13 04/15/2011   ALKPHOS 67 04/15/2011   BILITOT 0.5 04/15/2011   Lab Results  Component Value Date   HGBA1C 5.6 02/02/2020   HGBA1C 5.4 04/08/2018   Lab Results  Component Value Date   INSULIN 5.3 02/02/2020   INSULIN 4.4 04/08/2018   Lab Results  Component Value Date   TSH 1.650 02/02/2020   Lab Results  Component Value Date   CHOL 242 (H) 02/02/2020   HDL 104 02/02/2020   LDLCALC 130 (H) 02/02/2020   TRIG 48 02/02/2020   Lab Results  Component Value Date   WBC 4.5 12/20/2019   HGB 12.2 12/20/2019   HCT 37.6 12/20/2019   MCV 90.4 12/20/2019   PLT 216 12/20/2019   Attestation Statements:   Reviewed by clinician on day of visit: allergies, medications, problem list, medical history, surgical history, family history, social history, and previous encounter notes.  I, Insurance claims handler, CMA, am acting as Energy manager for [employer], DO  I have reviewed the above documentation for accuracy and completeness, and I agree with the above. Corinna Capra, DO

## 2020-04-27 ENCOUNTER — Ambulatory Visit: Payer: No Typology Code available for payment source | Admitting: Orthopaedic Surgery

## 2020-05-02 ENCOUNTER — Other Ambulatory Visit: Payer: Self-pay

## 2020-05-02 ENCOUNTER — Encounter: Payer: Self-pay | Admitting: Orthopaedic Surgery

## 2020-05-02 ENCOUNTER — Ambulatory Visit (INDEPENDENT_AMBULATORY_CARE_PROVIDER_SITE_OTHER): Payer: No Typology Code available for payment source | Admitting: Orthopaedic Surgery

## 2020-05-02 VITALS — BP 154/92 | HR 65 | Ht 62.0 in | Wt 317.1 lb

## 2020-05-02 DIAGNOSIS — M25562 Pain in left knee: Secondary | ICD-10-CM | POA: Diagnosis not present

## 2020-05-02 DIAGNOSIS — Z6841 Body Mass Index (BMI) 40.0 and over, adult: Secondary | ICD-10-CM | POA: Diagnosis not present

## 2020-05-02 DIAGNOSIS — M25561 Pain in right knee: Secondary | ICD-10-CM | POA: Diagnosis not present

## 2020-05-02 DIAGNOSIS — G8929 Other chronic pain: Secondary | ICD-10-CM

## 2020-05-02 MED ORDER — OXYCODONE-ACETAMINOPHEN 5-325 MG PO TABS: 1.0000 | ORAL_TABLET | ORAL | 0 refills | Status: DC | PRN

## 2020-05-02 MED ORDER — CYCLOBENZAPRINE HCL 10 MG PO TABS
10.0000 mg | ORAL_TABLET | Freq: Every day | ORAL | 3 refills | Status: DC | PRN
Start: 2020-05-02 — End: 2020-09-04

## 2020-05-02 NOTE — Progress Notes (Signed)
Patient Whitney Bauer, female DOB:04/22/1955, 65 y.o. BDZ:[id number]  Chief Complaint  Patient presents with  . Knee Pain    L/ hurting     HPI  Whitney Bauer is a 65 y.o. female who has bilateral knee pain, more on the right.  She has pain with the cold weather.  She has pain with prolonged standing or walking.  She has no new trauma.  She has swelling and popping.   Body mass index is 58 kg/m.  The patient meets the AMA guidelines for Morbid (severe) obesity with a BMI > 40.0 and I have recommended weight loss.  ROS  Review of Systems  HENT: Negative for congestion.   Respiratory: Negative for cough and shortness of breath.   Cardiovascular: Negative for chest pain and leg swelling.  Endocrine: Positive for cold intolerance.  Musculoskeletal: Positive for arthralgias, gait problem and joint swelling.  Allergic/Immunologic: Positive for environmental allergies.  All other systems reviewed and are negative.   All other systems reviewed and are negative.  The following is a summary of the past history medically, past history surgically, known current medicines, social history and family history.  This information is gathered electronically by the computer from prior information and documentation.  I review this each visit and have found including this information at this point in the chart is beneficial and informative.    Past Medical History:  Diagnosis Date  . Anemia   . Arthritis    bilat. knees  . Back pain   . Back pain   . Constipation   . Edema of both lower extremities   . Family history of adverse reaction to anesthesia    patients mother had cardiac issues during anesthesia  . Food allergy   . GERD (gastroesophageal reflux disease)   . Hypertension   . Joint pain   . Joint pain   . Lactose intolerance   . Rheumatoid arthritis (HCC)   . Swelling of both lower extremities   . Vitamin D deficiency     Past Surgical History:  Procedure Laterality Date   . BIOPSY  12/22/2019   Procedure: BIOPSY;  Surgeon: Malissa Hippo, MD;  Location: [facility];  Service: Endoscopy;;  antral  . COLONOSCOPY N/A 01/06/2014   Procedure: COLONOSCOPY;  Surgeon: Malissa Hippo, MD;  Location: [facility];  Service: Endoscopy;  Laterality: N/A;  830  . COLONOSCOPY WITH PROPOFOL N/A 12/22/2019   Procedure: COLONOSCOPY WITH PROPOFOL;  Surgeon: Malissa Hippo, MD;  Location: [facility];  Service: Endoscopy;  Laterality: N/A;  930  . ESOPHAGOGASTRODUODENOSCOPY (EGD) WITH PROPOFOL N/A 12/22/2019   Procedure: ESOPHAGOGASTRODUODENOSCOPY (EGD) WITH PROPOFOL;  Surgeon: Malissa Hippo, MD;  Location: [facility];  Service: Endoscopy;  Laterality: N/A;  . FOOT SURGERY    . PARTIAL HYSTERECTOMY  jan 1999    Family History  Problem Relation Age of Onset  . Asthma Mother   . Heart disease Mother   . Pancreatic cancer Mother   . Ovarian cancer Mother   . Diabetes Mother   . Stroke Mother   . High blood pressure Mother   . Asthma Father   . Colon cancer Father   . Prostate cancer Father   . Stroke Father   . High blood pressure Father   . Asthma Sister   . Cancer Paternal Grandmother        mouth    Social History Social History   Tobacco Use  .  Smoking status: Never Smoker  . Smokeless tobacco: Never Used  Vaping Use  . Vaping Use: Never used  Substance Use Topics  . Alcohol use: No  . Drug use: Never    Allergies  Allergen Reactions  . Pantoprazole Other (See Comments)    Facial/mouth/jaw spasms (facial droop)    Current Outpatient Medications  Medication Sig Dispense Refill  . atenolol-chlorthalidone (TENORETIC) 50-25 MG tablet Take 1 tablet by mouth daily.    . diclofenac (VOLTAREN) 75 MG EC tablet TAKE 1 TABLET (75 MG TOTAL) BY MOUTH 2 (TWO) TIMES DAILY WITH A MEAL. (Patient taking differently: Take 75 mg by mouth daily.) 60 tablet 5  . esomeprazole (NEXIUM) 40 MG capsule Take 40 mg by mouth daily.     Marland Kitchen loperamide (IMODIUM) 1  MG/5ML solution Take 2-4 mg by mouth 4 (four) times daily as needed for diarrhea or loose stools.     . traMADol (ULTRAM) 50 MG tablet Take 50 mg by mouth every 6 (six) hours as needed.     . Vitamin D, Ergocalciferol, (DRISDOL) 1.25 MG (50000 UNIT) CAPS capsule Take 1 capsule (50,000 Units total) by mouth every 7 (seven) days. 4 capsule 0  . cyclobenzaprine (FLEXERIL) 10 MG tablet Take 1 tablet (10 mg total) by mouth daily as needed for muscle spasms. 30 tablet 3  . oxyCODONE-acetaminophen (PERCOCET/ROXICET) 5-325 MG tablet Take 1 tablet by mouth every 4 (four) hours as needed for severe pain. 110 tablet 0   No current facility-administered medications for this visit.     Physical Exam  Blood pressure (!) 154/92, pulse 65, height 5\' 2"  (1.575 m), weight (!) 317 lb 2 oz (143.8 kg).  Constitutional: overall normal hygiene, normal nutrition, well developed, normal grooming, normal body habitus. Assistive device:cane  Musculoskeletal: gait and station Limp right, muscle tone and strength are normal, no tremors or atrophy is present.  .  Neurological: coordination overall normal.  Deep tendon reflex/nerve stretch intact.  Sensation normal.  Cranial nerves II-XII intact.   Skin:   Normal overall no scars, lesions, ulcers or rashes. No psoriasis.  Psychiatric: Alert and oriented x 3.  Recent memory intact, remote memory unclear.  Normal mood and affect. Well groomed.  Good eye contact.  Cardiovascular: overall no swelling, no varicosities, no edema bilaterally, normal temperatures of the legs and arms, no clubbing, cyanosis and good capillary refill.  Lymphatic: palpation is normal.  Both knees are tender, have effusion, crepitus, ROM right 0 to 100, left 0 to 105, NV intact.  All other systems reviewed and are negative   The patient has been educated about the nature of the problem(s) and counseled on treatment options.  The patient appeared to understand what I have discussed and is in  agreement with it.  Encounter Diagnoses  Name Primary?  . Chronic pain of right knee Yes  . Chronic pain of left knee   . Body mass index 50.0-59.9, adult (HCC)   . Morbid obesity due to excess calories [facility])     PLAN Call if any problems.  Precautions discussed.  Continue current medications.   Return to clinic 3 months   I have reviewed the [facility] Controlled Substance Reporting System web site prior to prescribing narcotic medicine for this patient.   Electronically Signed [facility], MD 2/8/202210:42 AM

## 2020-05-04 ENCOUNTER — Encounter (INDEPENDENT_AMBULATORY_CARE_PROVIDER_SITE_OTHER): Payer: Self-pay | Admitting: Bariatrics

## 2020-05-04 ENCOUNTER — Other Ambulatory Visit: Payer: Self-pay

## 2020-05-04 ENCOUNTER — Telehealth: Payer: Self-pay | Admitting: Orthopaedic Surgery

## 2020-05-04 ENCOUNTER — Ambulatory Visit (INDEPENDENT_AMBULATORY_CARE_PROVIDER_SITE_OTHER): Payer: No Typology Code available for payment source | Admitting: Bariatrics

## 2020-05-04 VITALS — BP 120/70 | HR 64 | Temp 97.9°F | Ht 62.0 in | Wt 313.0 lb

## 2020-05-04 DIAGNOSIS — Z6841 Body Mass Index (BMI) 40.0 and over, adult: Secondary | ICD-10-CM | POA: Diagnosis not present

## 2020-05-04 DIAGNOSIS — E66813 Obesity, class 3: Secondary | ICD-10-CM

## 2020-05-04 DIAGNOSIS — I10 Essential (primary) hypertension: Secondary | ICD-10-CM

## 2020-05-04 DIAGNOSIS — E7800 Pure hypercholesterolemia, unspecified: Secondary | ICD-10-CM | POA: Diagnosis not present

## 2020-05-04 NOTE — Telephone Encounter (Signed)
Whitney Bauer called stating that for the last 8 years she has used her cane or a small rolling cart with her personal items to walk into the building.  She said now they will not allow her to use it.  She wants to know if you will give her a note stating that she does need a cane or assistive device to help her into the building?  Thanks

## 2020-05-08 NOTE — Telephone Encounter (Signed)
Give her the note she needs.

## 2020-05-09 NOTE — Progress Notes (Signed)
Chief Complaint:   OBESITY Whitney Bauer is here to discuss her progress with her obesity treatment plan along with follow-up of her obesity related diagnoses. Whitney Bauer is on the Category 2 Plan and states she is following her eating plan approximately 10-20% of the time. Whitney Bauer states she is doing 0 minutes 0 times per week.  Today's visit was #: 9 Starting weight: 336 lbs Starting date: 1/15/12020 Today's weight: 313 lbs Today's date: 05/04/2020 Total lbs lost to date: 23 Total lbs lost since last in-office visit: 0  Interim History: Whitney Bauer's weight remains the same. She is under more stress at her job. She is doing some emotional eating.  Subjective:   1. Elevated cholesterol Whitney Bauer's last cholesterol panel was done on 02/02/2020. She is not on medications.  2. Essential hypertension Whitney Bauer's blood pressure is well controlled today at 120/70. She is taking Tenoretic.  Assessment/Plan:   1. Elevated cholesterol Cardiovascular risk and specific lipid/LDL goals reviewed. We discussed several lifestyle modifications today. Whitney Bauer will continue to work on diet, exercise and weight loss efforts. No trans fats, and decrease saturated fats. Orders and follow up as documented in patient record.   Counseling Intensive lifestyle modifications are the first line treatment for this issue. . Dietary changes: Increase soluble fiber. Decrease simple carbohydrates. . Exercise changes: Moderate to vigorous-intensity aerobic activity 150 minutes per week if tolerated. . Lipid-lowering medications: see documented in medical record.  2. Essential hypertension Whitney Bauer will continue her medications, and will continue working on healthy weight loss and exercise to improve blood pressure control. We will watch for signs of hypotension as she continues her lifestyle modifications.  3. Class 3 severe obesity with serious comorbidity and body mass index (BMI) of 50.0 to 59.9 in adult, unspecified obesity type (HCC) Whitney Bauer  is currently in the action stage of change. As such, her goal is to continue with weight loss efforts. She has agreed to the Category 2 Plan.   Whitney Bauer will be more adherent to the plan.  We will recheck fasting labs at her next visit.   Exercise goals: No exercise has been prescribed at this time.  Behavioral modification strategies: increasing lean protein intake, decreasing simple carbohydrates, increasing vegetables, increasing water intake, decreasing eating out, no skipping meals, meal planning and cooking strategies, keeping healthy foods in the home and planning for success.  Whitney Bauer has agreed to follow-up with our clinic in 2 to 3 weeks. She was informed of the importance of frequent follow-up visits to maximize her success with intensive lifestyle modifications for her multiple health conditions.   Objective:   Blood pressure 120/70, pulse 64, temperature 97.9 F (36.6 C), height 5\' 2"  (1.575 m), weight (!) 313 lb (142 kg), SpO2 100 %. Body mass index is 57.25 kg/m.  Whitney Bauer uses a cane for ambulation. General: Cooperative, alert, well developed, in no acute distress. HEENT: Conjunctivae and lids unremarkable. Cardiovascular: Regular rhythm.  Lungs: Normal work of breathing. Neurologic: No focal deficits.   Lab Results  Component Value Date   CREATININE 0.84 12/20/2019   BUN 21 12/20/2019   NA 139 12/20/2019   K 3.4 (L) 12/20/2019   CL 103 12/20/2019   CO2 28 12/20/2019   Lab Results  Component Value Date   ALT 16 04/15/2011   AST 13 04/15/2011   ALKPHOS 67 04/15/2011   BILITOT 0.5 04/15/2011   Lab Results  Component Value Date   HGBA1C 5.6 02/02/2020   HGBA1C 5.4 04/08/2018   Lab Results  Component Value Date   INSULIN 5.3 02/02/2020   INSULIN 4.4 04/08/2018   Lab Results  Component Value Date   TSH 1.650 02/02/2020   Lab Results  Component Value Date   CHOL 242 (H) 02/02/2020   HDL 104 02/02/2020   LDLCALC 130 (H) 02/02/2020   TRIG 48 02/02/2020    Lab Results  Component Value Date   WBC 4.5 12/20/2019   HGB 12.2 12/20/2019   HCT 37.6 12/20/2019   MCV 90.4 12/20/2019   PLT 216 12/20/2019   No results found for: IRON, TIBC, FERRITIN  Attestation Statements:   Reviewed by clinician on day of visit: allergies, medications, problem list, medical history, surgical history, family history, social history, and previous encounter notes.  Time spent on visit including pre-visit chart review and post-visit care and charting was 20 minutes.    Trude Mcburney, am acting as Energy manager for [employer], DO.  I have reviewed the above documentation for accuracy and completeness, and I agree with the above. Corinna Capra, DO

## 2020-05-10 ENCOUNTER — Encounter (INDEPENDENT_AMBULATORY_CARE_PROVIDER_SITE_OTHER): Payer: Self-pay | Admitting: Bariatrics

## 2020-05-10 ENCOUNTER — Encounter: Payer: Self-pay | Admitting: Orthopaedic Surgery

## 2020-05-16 ENCOUNTER — Telehealth: Payer: Self-pay | Admitting: Radiology

## 2020-05-16 NOTE — Telephone Encounter (Signed)
Patient called, asks if you will write her a letter stating she needs ambulation assistance at work.  She had discussed with you at last visit.  She did a reasonable accomodation several years ago, and now they are not letting her use amb asst device.  Please let her know when done and she will pick up a hard copy of the letter.

## 2020-05-25 ENCOUNTER — Encounter: Payer: Self-pay | Admitting: Orthopaedic Surgery

## 2020-05-29 ENCOUNTER — Ambulatory Visit (INDEPENDENT_AMBULATORY_CARE_PROVIDER_SITE_OTHER): Payer: No Typology Code available for payment source | Admitting: Bariatrics

## 2020-06-01 ENCOUNTER — Encounter (INDEPENDENT_AMBULATORY_CARE_PROVIDER_SITE_OTHER): Payer: Self-pay | Admitting: Bariatrics

## 2020-06-01 ENCOUNTER — Other Ambulatory Visit: Payer: Self-pay

## 2020-06-01 ENCOUNTER — Ambulatory Visit (INDEPENDENT_AMBULATORY_CARE_PROVIDER_SITE_OTHER): Payer: No Typology Code available for payment source | Admitting: Bariatrics

## 2020-06-01 VITALS — BP 116/76 | HR 64 | Temp 97.8°F | Ht 62.0 in | Wt 323.0 lb

## 2020-06-01 DIAGNOSIS — E662 Morbid (severe) obesity with alveolar hypoventilation: Secondary | ICD-10-CM | POA: Diagnosis not present

## 2020-06-01 DIAGNOSIS — I10 Essential (primary) hypertension: Secondary | ICD-10-CM

## 2020-06-01 DIAGNOSIS — E7800 Pure hypercholesterolemia, unspecified: Secondary | ICD-10-CM | POA: Diagnosis not present

## 2020-06-01 DIAGNOSIS — E559 Vitamin D deficiency, unspecified: Secondary | ICD-10-CM | POA: Diagnosis not present

## 2020-06-01 DIAGNOSIS — Z9189 Other specified personal risk factors, not elsewhere classified: Secondary | ICD-10-CM | POA: Diagnosis not present

## 2020-06-01 DIAGNOSIS — Z6841 Body Mass Index (BMI) 40.0 and over, adult: Secondary | ICD-10-CM

## 2020-06-01 MED ORDER — VITAMIN D (ERGOCALCIFEROL) 1.25 MG (50000 UNIT) PO CAPS: 50000.0000 [IU] | ORAL_CAPSULE | ORAL | 0 refills | Status: DC

## 2020-06-02 LAB — COMPREHENSIVE METABOLIC PANEL
ALT: 16 IU/L (ref 0–32)
AST: 16 IU/L (ref 0–40)
Albumin/Globulin Ratio: 1.9 (ref 1.2–2.2)
Albumin: 4.7 g/dL (ref 3.8–4.8)
Alkaline Phosphatase: 72 IU/L (ref 44–121)
BUN/Creatinine Ratio: 28 (ref 12–28)
BUN: 31 mg/dL — ABNORMAL HIGH (ref 8–27)
Bilirubin Total: 0.4 mg/dL (ref 0.0–1.2)
CO2: 23 mmol/L (ref 20–29)
Calcium: 9.6 mg/dL (ref 8.7–10.3)
Chloride: 99 mmol/L (ref 96–106)
Creatinine, Ser: 1.11 mg/dL — ABNORMAL HIGH (ref 0.57–1.00)
Globulin, Total: 2.5 g/dL (ref 1.5–4.5)
Glucose: 89 mg/dL (ref 65–99)
Potassium: 4.1 mmol/L (ref 3.5–5.2)
Sodium: 141 mmol/L (ref 134–144)
Total Protein: 7.2 g/dL (ref 6.0–8.5)
eGFR: 56 mL/min/{1.73_m2} — ABNORMAL LOW (ref >59)

## 2020-06-02 LAB — LIPID PANEL WITH LDL/HDL RATIO
Cholesterol, Total: 262 mg/dL — ABNORMAL HIGH (ref 100–199)
HDL: 91 mg/dL (ref >39)
LDL Chol Calc (NIH): 158 mg/dL — ABNORMAL HIGH (ref 0–99)
LDL/HDL Ratio: 1.7 ratio (ref 0.0–3.2)
Triglycerides: 80 mg/dL (ref 0–149)
VLDL Cholesterol Cal: 13 mg/dL (ref 5–40)

## 2020-06-02 LAB — VITAMIN D 25 HYDROXY (VIT D DEFICIENCY, FRACTURES): Vit D, 25-Hydroxy: 20.3 ng/mL — ABNORMAL LOW (ref 30.0–100.0)

## 2020-06-06 ENCOUNTER — Encounter: Payer: Self-pay | Admitting: Orthopaedic Surgery

## 2020-06-06 ENCOUNTER — Ambulatory Visit: Payer: No Typology Code available for payment source | Admitting: Orthopaedic Surgery

## 2020-06-06 ENCOUNTER — Ambulatory Visit: Payer: No Typology Code available for payment source

## 2020-06-06 ENCOUNTER — Other Ambulatory Visit: Payer: Self-pay

## 2020-06-06 VITALS — BP 175/97 | HR 69

## 2020-06-06 DIAGNOSIS — M25551 Pain in right hip: Secondary | ICD-10-CM

## 2020-06-06 DIAGNOSIS — G8929 Other chronic pain: Secondary | ICD-10-CM

## 2020-06-06 DIAGNOSIS — M5441 Lumbago with sciatica, right side: Secondary | ICD-10-CM | POA: Diagnosis not present

## 2020-06-06 MED ORDER — PREDNISONE 5 MG (21) PO TBPK: ORAL_TABLET | ORAL | 0 refills | Status: DC

## 2020-06-06 NOTE — Progress Notes (Signed)
Chief Complaint:   OBESITY Whitney Bauer is here to discuss her progress with her obesity treatment plan along with follow-up of her obesity related diagnoses. Whitney Bauer is on the Category 2 Plan and states she is following her eating plan approximately 0% of the time. Whitney Bauer states she is doing 0 minutes 0 times per week.  Today's visit was #: 10 Starting weight: 336 lbs Starting date: 04/08/2018 Today's weight: 323 lbs Today's date: 06/01/2020 Total lbs lost to date: 13 Total lbs lost since last in-office visit: 0  Interim History: Whitney Bauer is up 10 lbs since her last visit. She has not been following the plan at all, but she is surprised that she gained weight. She denies shortness of breath.  Subjective:   1. Essential hypertension Whitney Bauer is taking Tenoretic currently.  2. Vitamin D deficiency Whitney Bauer is taking Vit D prescription currently.  3. Elevated cholesterol Whitney Bauer is not currently on medications.  4. At risk for heart disease Whitney Bauer is at a higher than average risk for cardiovascular disease due to obesity.   Assessment/Plan:   1. Essential hypertension Whitney Bauer will continue Tenoretic, and will continue working on healthy weight loss and exercise to improve blood pressure control. We will watch for signs of hypotension as she continues her lifestyle modifications. We will check labs today.  - Comprehensive metabolic panel  2. Vitamin D deficiency Low Vitamin D level contributes to fatigue and are associated with obesity, breast, and colon cancer. We will check labs today, and we will refill prescription Vitamin D for 1 month. Whitney Bauer will follow-up for routine testing of Vitamin D, at least 2-3 times per year to avoid over-replacement.  - Vitamin D, Ergocalciferol, (DRISDOL) 1.25 MG (50000 UNIT) CAPS capsule; Take 1 capsule (50,000 Units total) by mouth every 7 (seven) days.  Dispense: 4 capsule; Refill: 0 - VITAMIN D 25 Hydroxy (Vit-D Deficiency, Fractures)  3. Elevated  cholesterol Cardiovascular risk and specific lipid/LDL goals reviewed. We discussed several lifestyle modifications today. We will check labs today. Whitney Bauer will continue to work on diet, exercise and weight loss efforts. Orders and follow up as documented in patient record.   Counseling Intensive lifestyle modifications are the first line treatment for this issue. . Dietary changes: Increase soluble fiber. Decrease simple carbohydrates. . Exercise changes: Moderate to vigorous-intensity aerobic activity 150 minutes per week if tolerated. . Lipid-lowering medications: see documented in medical record.  - Lipid Panel With LDL/HDL Ratio  4. At risk for heart disease Whitney Bauer was given approximately 15 minutes of coronary artery disease prevention counseling today. She is 65 y.o. female and has risk factors for heart disease including obesity. We discussed intensive lifestyle modifications today with an emphasis on specific weight loss instructions and strategies.   Repetitive spaced learning was employed today to elicit superior memory formation and behavioral change.  5. Class 3 obesity with alveolar hypoventilation, serious comorbidity, and body mass index (BMI) of 50.0 to 59.9 in adult [facility]) Whitney Bauer is currently in the action stage of change. As such, her goal is to continue with weight loss efforts. She has agreed to the [employer].   Intentional eating was discussed. Whitney Bauer is to stop all take-out.  Exercise goals: No exercise has been prescribed at this time.  Behavioral modification strategies: increasing lean protein intake, decreasing simple carbohydrates, increasing vegetables, increasing water intake, decreasing eating out, no skipping meals, meal planning and cooking strategies, keeping healthy foods in the home and planning for success.  Whitney Bauer has agreed to  follow-up with our clinic in 2 to 3 weeks. She was informed of the importance of frequent follow-up visits to maximize her success  with intensive lifestyle modifications for her multiple health conditions.   Whitney Bauer was informed we would discuss her lab results at her next visit unless there is a critical issue that needs to be addressed sooner. Whitney Bauer agreed to keep her next visit at the agreed upon time to discuss these results.  Objective:   Blood pressure 116/76, pulse 64, temperature 97.8 F (36.6 C), height 5\' 2"  (1.575 m), weight (!) 323 lb (146.5 kg), SpO2 99 %. Body mass index is 59.08 kg/m.  She uses a cane for ambulation. Edema in lower legs. General: Cooperative, alert, well developed, in no acute distress. HEENT: Conjunctivae and lids unremarkable. Cardiovascular: Regular rhythm.  Lungs: Normal work of breathing. She denies shortness of breath. Neurologic: No focal deficits.   Lab Results  Component Value Date   CREATININE 1.11 (H) 06/01/2020   BUN 31 (H) 06/01/2020   NA 141 06/01/2020   K 4.1 06/01/2020   CL 99 06/01/2020   CO2 23 06/01/2020   Lab Results  Component Value Date   ALT 16 06/01/2020   AST 16 06/01/2020   ALKPHOS 72 06/01/2020   BILITOT 0.4 06/01/2020   Lab Results  Component Value Date   HGBA1C 5.6 02/02/2020   HGBA1C 5.4 04/08/2018   Lab Results  Component Value Date   INSULIN 5.3 02/02/2020   INSULIN 4.4 04/08/2018   Lab Results  Component Value Date   TSH 1.650 02/02/2020   Lab Results  Component Value Date   CHOL 262 (H) 06/01/2020   HDL 91 06/01/2020   LDLCALC 158 (H) 06/01/2020   TRIG 80 06/01/2020   Lab Results  Component Value Date   WBC 4.5 12/20/2019   HGB 12.2 12/20/2019   HCT 37.6 12/20/2019   MCV 90.4 12/20/2019   PLT 216 12/20/2019   No results found for: IRON, TIBC, FERRITIN  Attestation Statements:   Reviewed by clinician on day of visit: allergies, medications, problem list, medical history, surgical history, family history, social history, and previous encounter notes.   12/22/2019, am acting as Whitney Bauer for Energy manager,  DO.  I have reviewed the above documentation for accuracy and completeness, and I agree with the above. [employer], DO

## 2020-06-06 NOTE — Progress Notes (Signed)
Patient Whitney Bauer, female DOB:Sep 18, 1955, 64 y.o. IHK:[id number]  Chief Complaint  Patient presents with  . Hip Pain    Right hip pain, 05/27/20. She is not aware of any injury,     HPI  Whitney Bauer is a 65 y.o. female who has begun to have pain from the back that runs by the right hip down the right leg.  It is getting worse.  She has no trauma.  She is taking diclofenac and has pain medicine.  That only helps some.  She has tried various positions to try to relieve the pain.   There is no height or weight on file to calculate BMI.  The patient meets the AMA guidelines for Morbid (severe) obesity with a BMI > 40.0 and I have recommended weight loss.   ROS  Review of Systems  HENT: Negative for congestion.   Respiratory: Negative for cough and shortness of breath.   Cardiovascular: Negative for chest pain and leg swelling.  Endocrine: Positive for cold intolerance.  Musculoskeletal: Positive for arthralgias, gait problem and joint swelling.  Allergic/Immunologic: Positive for environmental allergies.  All other systems reviewed and are negative.   All other systems reviewed and are negative.  The following is a summary of the past history medically, past history surgically, known current medicines, social history and family history.  This information is gathered electronically by the computer from prior information and documentation.  I review this each visit and have found including this information at this point in the chart is beneficial and informative.    Past Medical History:  Diagnosis Date  . Anemia   . Arthritis    bilat. knees  . Back pain   . Back pain   . Constipation   . Edema of both lower extremities   . Family history of adverse reaction to anesthesia    patients mother had cardiac issues during anesthesia  . Food allergy   . GERD (gastroesophageal reflux disease)   . Hypertension   . Joint pain   . Joint pain   . Lactose intolerance   .  Rheumatoid arthritis (HCC)   . Swelling of both lower extremities   . Vitamin D deficiency     Past Surgical History:  Procedure Laterality Date  . BIOPSY  12/22/2019   Procedure: BIOPSY;  Surgeon: Malissa Hippo, MD;  [facility]: [facility];  Service: Endoscopy;;  antral  . COLONOSCOPY N/A 01/06/2014   Procedure: COLONOSCOPY;  Surgeon: Malissa Hippo, MD;  [facility]: [facility];  Service: Endoscopy;  Laterality: N/A;  830  . COLONOSCOPY WITH PROPOFOL N/A 12/22/2019   Procedure: COLONOSCOPY WITH PROPOFOL;  Surgeon: Malissa Hippo, MD;  [facility]: [facility];  Service: Endoscopy;  Laterality: N/A;  930  . ESOPHAGOGASTRODUODENOSCOPY (EGD) WITH PROPOFOL N/A 12/22/2019   Procedure: ESOPHAGOGASTRODUODENOSCOPY (EGD) WITH PROPOFOL;  Surgeon: Malissa Hippo, MD;  [facility];  Service: Endoscopy;  Laterality: N/A;  . FOOT SURGERY    . PARTIAL HYSTERECTOMY  jan 1999    Family History  Problem Relation Age of Onset  . Asthma Mother   . Heart disease Mother   . Pancreatic cancer Mother   . Ovarian cancer Mother   . Diabetes Mother   . Stroke Mother   . High blood pressure Mother   . Asthma Father   . Colon cancer Father   . Prostate cancer Father   . Stroke Father   . High blood pressure Father   .  Asthma Sister   . Cancer Paternal Grandmother        mouth    Social History Social History   Tobacco Use  . Smoking status: Never Smoker  . Smokeless tobacco: Never Used  Vaping Use  . Vaping Use: Never used  Substance Use Topics  . Alcohol use: No  . Drug use: Never    Allergies  Allergen Reactions  . Pantoprazole Other (See Comments)    Facial/mouth/jaw spasms (facial droop)    Current Outpatient Medications  Medication Sig Dispense Refill  . atenolol-chlorthalidone (TENORETIC) 50-25 MG tablet Take 1 tablet by mouth daily.    . cyclobenzaprine (FLEXERIL) 10 MG tablet Take 1 tablet (10 mg total) by mouth daily as needed for muscle spasms. 30 tablet 3   . diclofenac (VOLTAREN) 75 MG EC tablet TAKE 1 TABLET (75 MG TOTAL) BY MOUTH 2 (TWO) TIMES DAILY WITH A MEAL. (Patient taking differently: Take 75 mg by mouth daily.) 60 tablet 5  . esomeprazole (NEXIUM) 40 MG capsule Take 40 mg by mouth daily.     Marland Kitchen loperamide (IMODIUM) 1 MG/5ML solution Take 2-4 mg by mouth 4 (four) times daily as needed for diarrhea or loose stools.     Marland Kitchen oxyCODONE-acetaminophen (PERCOCET/ROXICET) 5-325 MG tablet Take 1 tablet by mouth every 4 (four) hours as needed for severe pain. 110 tablet 0  . predniSONE (STERAPRED UNI-PAK 21 TAB) 5 MG (21) TBPK tablet Take 6 pills first day; 5 pills second day; 4 pills third day; 3 pills fourth day; 2 pills next day and 1 pill last day. 21 tablet 0  . traMADol (ULTRAM) 50 MG tablet Take 50 mg by mouth every 6 (six) hours as needed.     . Vitamin D, Ergocalciferol, (DRISDOL) 1.25 MG (50000 UNIT) CAPS capsule Take 1 capsule (50,000 Units total) by mouth every 7 (seven) days. 4 capsule 0   No current facility-administered medications for this visit.     Physical Exam  Blood pressure (!) 175/97, pulse 69.  Constitutional: overall normal hygiene, normal nutrition, well developed, normal grooming, normal body habitus. Assistive device:cane  Musculoskeletal: gait and station Limp right, muscle tone and strength are normal, no tremors or atrophy is present.  .  Neurological: coordination overall normal.  Deep tendon reflex/nerve stretch intact.  Sensation normal.  Cranial nerves II-XII intact.   Skin:   Normal overall no scars, lesions, ulcers or rashes. No psoriasis.  Psychiatric: Alert and oriented x 3.  Recent memory intact, remote memory unclear.  Normal mood and affect. Well groomed.  Good eye contact.  Cardiovascular: overall no swelling, no varicosities, no edema bilaterally, normal temperatures of the legs and arms, no clubbing, cyanosis and good capillary refill.  Lymphatic: palpation is normal.  She has lower back pain,  more on the right with no spasm, limp right, NV intact, SLR negative.  No weakness.  All other systems reviewed and are negative   The patient has been educated about the nature of the problem(s) and counseled on treatment options.  The patient appeared to understand what I have discussed and is in agreement with it.  Encounter Diagnoses  Name Primary?  . Chronic right-sided low back pain with right-sided sciatica Yes  . Acute right hip pain   X-rays were done of the right hip and lower back, reported separately. She may need MRI.  PLAN Call if any problems.  Precautions discussed.  Continue current medications.   Return to clinic 1 week   I will  begin prednisone dose pack. Stop the diclofenac when on the prednisone.  Electronically Signed Darreld Mclean, MD 3/15/20223:50 PM

## 2020-06-07 ENCOUNTER — Encounter (INDEPENDENT_AMBULATORY_CARE_PROVIDER_SITE_OTHER): Payer: Self-pay | Admitting: Bariatrics

## 2020-06-13 ENCOUNTER — Ambulatory Visit: Payer: No Typology Code available for payment source | Admitting: Orthopaedic Surgery

## 2020-06-13 ENCOUNTER — Encounter: Payer: Self-pay | Admitting: Orthopaedic Surgery

## 2020-06-13 ENCOUNTER — Other Ambulatory Visit: Payer: Self-pay

## 2020-06-13 VITALS — BP 128/80 | HR 71 | Ht 62.0 in | Wt 323.0 lb

## 2020-06-13 DIAGNOSIS — M170 Bilateral primary osteoarthritis of knee: Secondary | ICD-10-CM

## 2020-06-13 DIAGNOSIS — G8929 Other chronic pain: Secondary | ICD-10-CM

## 2020-06-13 DIAGNOSIS — Z6841 Body Mass Index (BMI) 40.0 and over, adult: Secondary | ICD-10-CM | POA: Diagnosis not present

## 2020-06-13 DIAGNOSIS — M5441 Lumbago with sciatica, right side: Secondary | ICD-10-CM

## 2020-06-13 NOTE — Progress Notes (Signed)
Patient Whitney Bauer, female DOB:1956/01/11, 65 y.o. EAV:[id number]  Chief Complaint  Patient presents with  . Back Pain    LBP    HPI  Whitney Bauer is a 65 y.o. female who had acute lower back pain. I gave her prednisone dose pack. She took it for three days and stopped as she wanted to go back on the diclofenac for her left knee pain.  The back pain is improved, the left knee is not hurting as badly. She has no new trauma. She may need MRI of the back if the pain returns.   Body mass index is 59.08 kg/m.  The patient meets the AMA guidelines for Morbid (severe) obesity with a BMI > 40.0 and I have recommended weight loss.   ROS  Review of Systems  HENT: Negative for congestion.   Respiratory: Negative for cough and shortness of breath.   Cardiovascular: Negative for chest pain and leg swelling.  Endocrine: Positive for cold intolerance.  Musculoskeletal: Positive for arthralgias, gait problem and joint swelling.  Allergic/Immunologic: Positive for environmental allergies.  All other systems reviewed and are negative.   All other systems reviewed and are negative.  The following is a summary of the past history medically, past history surgically, known current medicines, social history and family history.  This information is gathered electronically by the computer from prior information and documentation.  I review this each visit and have found including this information at this point in the chart is beneficial and informative.    Past Medical History:  Diagnosis Date  . Anemia   . Arthritis    bilat. knees  . Back pain   . Back pain   . Constipation   . Edema of both lower extremities   . Family history of adverse reaction to anesthesia    patients mother had cardiac issues during anesthesia  . Food allergy   . GERD (gastroesophageal reflux disease)   . Hypertension   . Joint pain   . Joint pain   . Lactose intolerance   . Rheumatoid arthritis (HCC)   .  Swelling of both lower extremities   . Vitamin D deficiency     Past Surgical History:  Procedure Laterality Date  . BIOPSY  12/22/2019   Procedure: BIOPSY;  Surgeon: Malissa Hippo, MD;  [facility]: [facility];  Service: Endoscopy;;  antral  . COLONOSCOPY N/A 01/06/2014   Procedure: COLONOSCOPY;  Surgeon: Malissa Hippo, MD;  [facility];  Service: Endoscopy;  Laterality: N/A;  830  . COLONOSCOPY WITH PROPOFOL N/A 12/22/2019   Procedure: COLONOSCOPY WITH PROPOFOL;  Surgeon: Malissa Hippo, MD;  [facility];  Service: Endoscopy;  Laterality: N/A;  930  . ESOPHAGOGASTRODUODENOSCOPY (EGD) WITH PROPOFOL N/A 12/22/2019   Procedure: ESOPHAGOGASTRODUODENOSCOPY (EGD) WITH PROPOFOL;  Surgeon: Malissa Hippo, MD;  [facility];  Service: Endoscopy;  Laterality: N/A;  . FOOT SURGERY    . PARTIAL HYSTERECTOMY  jan 1999    Family History  Problem Relation Age of Onset  . Asthma Mother   . Heart disease Mother   . Pancreatic cancer Mother   . Ovarian cancer Mother   . Diabetes Mother   . Stroke Mother   . High blood pressure Mother   . Asthma Father   . Colon cancer Father   . Prostate cancer Father   . Stroke Father   . High blood pressure Father   . Asthma Sister   .  Cancer Paternal Grandmother        mouth    Social History Social History   Tobacco Use  . Smoking status: Never Smoker  . Smokeless tobacco: Never Used  Vaping Use  . Vaping Use: Never used  Substance Use Topics  . Alcohol use: No  . Drug use: Never    Allergies  Allergen Reactions  . Pantoprazole Other (See Comments)    Facial/mouth/jaw spasms (facial droop)    Current Outpatient Medications  Medication Sig Dispense Refill  . atenolol-chlorthalidone (TENORETIC) 50-25 MG tablet Take 1 tablet by mouth daily.    . cyclobenzaprine (FLEXERIL) 10 MG tablet Take 1 tablet (10 mg total) by mouth daily as needed for muscle spasms. 30 tablet 3  . diclofenac (VOLTAREN) 75 MG  EC tablet TAKE 1 TABLET (75 MG TOTAL) BY MOUTH 2 (TWO) TIMES DAILY WITH A MEAL. (Patient taking differently: Take 75 mg by mouth daily.) 60 tablet 5  . esomeprazole (NEXIUM) 40 MG capsule Take 40 mg by mouth daily.     Marland Kitchen loperamide (IMODIUM) 1 MG/5ML solution Take 2-4 mg by mouth 4 (four) times daily as needed for diarrhea or loose stools.     Marland Kitchen oxyCODONE-acetaminophen (PERCOCET/ROXICET) 5-325 MG tablet Take 1 tablet by mouth every 4 (four) hours as needed for severe pain. 110 tablet 0  . predniSONE (STERAPRED UNI-PAK 21 TAB) 5 MG (21) TBPK tablet Take 6 pills first day; 5 pills second day; 4 pills third day; 3 pills fourth day; 2 pills next day and 1 pill last day. (Patient not taking: Reported on 06/13/2020) 21 tablet 0  . traMADol (ULTRAM) 50 MG tablet Take 50 mg by mouth every 6 (six) hours as needed.     . Vitamin D, Ergocalciferol, (DRISDOL) 1.25 MG (50000 UNIT) CAPS capsule Take 1 capsule (50,000 Units total) by mouth every 7 (seven) days. 4 capsule 0   No current facility-administered medications for this visit.     Physical Exam  Blood pressure 128/80, pulse 71, height 5\' 2"  (1.575 m), weight (!) 323 lb (146.5 kg).  Constitutional: overall normal hygiene, normal nutrition, well developed, normal grooming, normal body habitus. Assistive device:cane  Musculoskeletal: gait and station Limp left, muscle tone and strength are normal, no tremors or atrophy is present.  .  Neurological: coordination overall normal.  Deep tendon reflex/nerve stretch intact.  Sensation normal.  Cranial nerves II-XII intact.   Skin:   Normal overall no scars, lesions, ulcers or rashes. No psoriasis.  Psychiatric: Alert and oriented x 3.  Recent memory intact, remote memory unclear.  Normal mood and affect. Well groomed.  Good eye contact.  Cardiovascular: overall no swelling, no varicosities, no edema bilaterally, normal temperatures of the legs and arms, no clubbing, cyanosis and good capillary  refill.  Lymphatic: palpation is normal.  Spine/Pelvis examination:  Inspection:  Overall, sacoiliac joint benign and hips nontender; without crepitus or defects.   Thoracic spine inspection: Alignment normal without kyphosis present   Lumbar spine inspection:  Alignment  with normal lumbar lordosis, without scoliosis apparent.   Thoracic spine palpation:  without tenderness of spinal processes   Lumbar spine palpation: without tenderness of lumbar area; without tightness of lumbar muscles    Range of Motion:   Lumbar flexion, forward flexion is normal without pain or tenderness    Lumbar extension is full without pain or tenderness   Left lateral bend is normal without pain or tenderness   Right lateral bend is normal without pain or  tenderness   Straight leg raising is normal  Strength & tone: normal   Stability overall normal stability  All other systems reviewed and are negative   The patient has been educated about the nature of the problem(s) and counseled on treatment options.  The patient appeared to understand what I have discussed and is in agreement with it.  Encounter Diagnoses  Name Primary?  . Chronic right-sided low back pain with right-sided sciatica Yes  . Bilateral primary osteoarthritis of knee   . Body mass index 50.0-59.9, adult (HCC)   . Morbid obesity due to excess calories [facility])     PLAN Call if any problems.  Precautions discussed.  Continue current medications.   Return to clinic 1 month   Electronically Signed Darreld Mclean, MD 3/22/20223:16 PM

## 2020-06-14 ENCOUNTER — Ambulatory Visit (INDEPENDENT_AMBULATORY_CARE_PROVIDER_SITE_OTHER): Payer: No Typology Code available for payment source | Admitting: Bariatrics

## 2020-06-15 ENCOUNTER — Encounter (INDEPENDENT_AMBULATORY_CARE_PROVIDER_SITE_OTHER): Payer: Self-pay | Admitting: Bariatrics

## 2020-06-15 ENCOUNTER — Ambulatory Visit (INDEPENDENT_AMBULATORY_CARE_PROVIDER_SITE_OTHER): Payer: No Typology Code available for payment source | Admitting: Bariatrics

## 2020-06-15 ENCOUNTER — Other Ambulatory Visit: Payer: Self-pay

## 2020-06-15 VITALS — BP 132/82 | HR 57 | Temp 98.1°F | Ht 62.0 in | Wt 312.0 lb

## 2020-06-15 DIAGNOSIS — E662 Morbid (severe) obesity with alveolar hypoventilation: Secondary | ICD-10-CM | POA: Diagnosis not present

## 2020-06-15 DIAGNOSIS — E559 Vitamin D deficiency, unspecified: Secondary | ICD-10-CM | POA: Diagnosis not present

## 2020-06-15 DIAGNOSIS — Z6841 Body Mass Index (BMI) 40.0 and over, adult: Secondary | ICD-10-CM | POA: Diagnosis not present

## 2020-06-15 DIAGNOSIS — I10 Essential (primary) hypertension: Secondary | ICD-10-CM

## 2020-06-19 ENCOUNTER — Encounter (INDEPENDENT_AMBULATORY_CARE_PROVIDER_SITE_OTHER): Payer: Self-pay | Admitting: Bariatrics

## 2020-06-19 NOTE — Progress Notes (Signed)
Chief Complaint:   OBESITY Whitney Bauer is here to discuss her progress with her obesity treatment plan along with follow-up of her obesity related diagnoses. Whitney Bauer is on the Pescatarian Plan and states she is following her eating plan approximately 80% of the time. Whitney Bauer states she is doing 0 minutes 0 times per week.  Today's visit was #: 11 Starting weight: 336 lbs Starting date: 04/08/2018 Today's weight: 312 lbs Today's date: 06/15/2020 Total lbs lost to date: 24 lbs Total lbs lost since last in-office visit: 11 lbs  Interim History: Whitney Bauer is down 11 lbs since her last visit. She is doing well with the Pescatarian plan.   Subjective:   1. Essential hypertension Whitney Bauer is taking Tenoretic 50-25 mg. She is reasonably well controlled.  BP Readings from Last 3 Encounters:  06/15/20 132/82  06/13/20 128/80  06/06/20 (!) 175/97    2. Vitamin D deficiency Whitney Bauer's Vitamin D level was 20.3 on 06/01/2020. She is currently taking prescription vitamin D 50,000 IU each week.    Ref. Range 06/01/2020 11:25  Vitamin D, 25-Hydroxy Latest Ref Range: 30.0 - 100.0 ng/mL 20.3 (L)   Assessment/Plan:   1. Essential hypertension Whitney Bauer is working on healthy weight loss and exercise to improve blood pressure control. We will watch for signs of hypotension as she continues her lifestyle modifications. Continue current medication. Will bring in her blood pressure cuff at her next visit to calibrate with ours. .  2. Vitamin D deficiency Low Vitamin D level contributes to fatigue and are associated with obesity, breast, and colon cancer. She agrees to continue to take prescription Vitamin D @50 ,000 IU every week and will follow-up for routine testing of Vitamin D, at least 2-3 times per year to avoid over-replacement.  3. Obesity, current BMI 57.05 Whitney Bauer is currently in the action stage of change. As such, her goal is to continue with weight loss efforts. She has agreed to the .   Exercise  goals: As is  Behavioral modification strategies: increasing lean protein intake, decreasing simple carbohydrates, increasing vegetables, increasing water intake, decreasing eating out, no skipping meals, meal planning and cooking strategies, keeping healthy foods in the home and planning for success.  Whitney Bauer has agreed to follow-up with our clinic in 2-3 weeks. She was informed of the importance of frequent follow-up visits to maximize her success with intensive lifestyle modifications for her multiple health conditions.   Objective:   Blood pressure 132/82, pulse (!) 57, temperature 98.1 F (36.7 C), height 5\' 2"  (1.575 m), weight (!) 312 lb (141.5 kg), SpO2 100 %. Body mass index is 57.07 kg/m.  General: Cooperative, alert, well developed, in no acute distress. Using a cane for ambulation. HEENT: Conjunctivae and lids unremarkable. Cardiovascular: Regular rhythm.  Lungs: Normal work of breathing. Neurologic: No focal deficits.   Lab Results  Component Value Date   CREATININE 1.11 (H) 06/01/2020   BUN 31 (H) 06/01/2020   NA 141 06/01/2020   K 4.1 06/01/2020   CL 99 06/01/2020   CO2 23 06/01/2020   Lab Results  Component Value Date   ALT 16 06/01/2020   AST 16 06/01/2020   ALKPHOS 72 06/01/2020   BILITOT 0.4 06/01/2020   Lab Results  Component Value Date   HGBA1C 5.6 02/02/2020   HGBA1C 5.4 04/08/2018   Lab Results  Component Value Date   INSULIN 5.3 02/02/2020   INSULIN 4.4 04/08/2018   Lab Results  Component Value Date   TSH 1.650 02/02/2020  Lab Results  Component Value Date   CHOL 262 (H) 06/01/2020   HDL 91 06/01/2020   LDLCALC 158 (H) 06/01/2020   TRIG 80 06/01/2020   Lab Results  Component Value Date   WBC 4.5 12/20/2019   HGB 12.2 12/20/2019   HCT 37.6 12/20/2019   MCV 90.4 12/20/2019   PLT 216 12/20/2019    Attestation Statements:   Reviewed by clinician on day of visit: allergies, medications, problem list, medical history, surgical  history, family history, social history, and previous encounter notes.  Time spent on visit including pre-visit chart review and post-visit care and charting was 20 minutes.   Edmund Hilda, am acting as Energy manager for [employer], DO.  I have reviewed the above documentation for accuracy and completeness, and I agree with the above. Corinna Capra, DO

## 2020-07-06 ENCOUNTER — Other Ambulatory Visit: Payer: Self-pay

## 2020-07-06 ENCOUNTER — Ambulatory Visit (INDEPENDENT_AMBULATORY_CARE_PROVIDER_SITE_OTHER): Payer: No Typology Code available for payment source | Admitting: Bariatrics

## 2020-07-06 ENCOUNTER — Encounter (INDEPENDENT_AMBULATORY_CARE_PROVIDER_SITE_OTHER): Payer: Self-pay | Admitting: Bariatrics

## 2020-07-06 ENCOUNTER — Other Ambulatory Visit (INDEPENDENT_AMBULATORY_CARE_PROVIDER_SITE_OTHER): Payer: Self-pay | Admitting: Bariatrics

## 2020-07-06 VITALS — BP 107/78 | HR 68 | Temp 98.0°F | Ht 62.0 in | Wt 308.0 lb

## 2020-07-06 DIAGNOSIS — E559 Vitamin D deficiency, unspecified: Secondary | ICD-10-CM

## 2020-07-06 DIAGNOSIS — K219 Gastro-esophageal reflux disease without esophagitis: Secondary | ICD-10-CM | POA: Diagnosis not present

## 2020-07-06 DIAGNOSIS — Z6841 Body Mass Index (BMI) 40.0 and over, adult: Secondary | ICD-10-CM

## 2020-07-06 MED ORDER — VITAMIN D (ERGOCALCIFEROL) 1.25 MG (50000 UNIT) PO CAPS: 50000.0000 [IU] | ORAL_CAPSULE | ORAL | 0 refills | Status: DC

## 2020-07-06 NOTE — Telephone Encounter (Signed)
Dr.Brown 

## 2020-07-11 ENCOUNTER — Encounter: Payer: Self-pay | Admitting: Orthopaedic Surgery

## 2020-07-11 ENCOUNTER — Encounter (INDEPENDENT_AMBULATORY_CARE_PROVIDER_SITE_OTHER): Payer: Self-pay | Admitting: Bariatrics

## 2020-07-11 ENCOUNTER — Other Ambulatory Visit: Payer: Self-pay

## 2020-07-11 ENCOUNTER — Ambulatory Visit: Payer: No Typology Code available for payment source | Admitting: Orthopaedic Surgery

## 2020-07-11 VITALS — BP 123/73 | HR 60 | Ht 62.0 in | Wt 308.0 lb

## 2020-07-11 DIAGNOSIS — Z6841 Body Mass Index (BMI) 40.0 and over, adult: Secondary | ICD-10-CM | POA: Diagnosis not present

## 2020-07-11 DIAGNOSIS — M170 Bilateral primary osteoarthritis of knee: Secondary | ICD-10-CM | POA: Diagnosis not present

## 2020-07-11 NOTE — Progress Notes (Signed)
Chief Complaint:   OBESITY Whitney Bauer is here to discuss her progress with her obesity treatment plan along with follow-up of her obesity related diagnoses. Whitney Bauer is on the Pescatarian Plan and states she is following her eating plan approximately 80% of the time. Whitney Bauer states she is not exercising regularly due to knee pain.  Today's visit was #: 12 Starting weight: 336 lbs Starting date: 04/08/2018 Today's weight: 308 lbs Today's date: 07/06/2020 Total lbs lost to date: 28 lbs Total lbs lost since last in-office visit: 4 lbs  Interim History: Whitney Bauer is down an additional 4 pounds and is doing well overall.  Subjective:   1. Vitamin D deficiency Whitney Bauer's Vitamin D level was 20.3 on 06/01/2020. She is currently taking prescription vitamin D 50,000 IU each week. She denies nausea, vomiting or muscle weakness.  2. Gastroesophageal reflux disease without esophagitis She is taking Nexium.  Assessment/Plan:   1. Vitamin D deficiency Low Vitamin D level contributes to fatigue and are associated with obesity, breast, and colon cancer. She agrees to continue to take prescription Vitamin D @50 ,000 IU every week and will follow-up for routine testing of Vitamin D, at least 2-3 times per year to avoid over-replacement.  2. Gastroesophageal reflux disease without esophagitis Intensive lifestyle modifications are the first line treatment for this issue. We discussed several lifestyle modifications today and she will continue to work on diet, exercise and weight loss efforts. Orders and follow up as documented in patient record.  Continue taking Nexium on a regular basis.  Avoid foods that trigger signs and symptoms.   Counseling . If a person has gastroesophageal reflux disease (GERD), food and stomach acid move back up into the esophagus and cause symptoms or problems such as damage to the esophagus. . Anti-reflux measures include: raising the head of the bed, avoiding tight clothing or belts, avoiding  eating late at night, not lying down shortly after mealtime, and achieving weight loss. . Avoid ASA, NSAID's, caffeine, alcohol, and tobacco.  . OTC Pepcid and/or Tums are often very helpful for as needed use.  However, for persisting chronic or daily symptoms, stronger medications like Omeprazole may be needed. . You may need to avoid foods and drinks such as: ? Coffee and tea (with or without caffeine). ? Drinks that contain alcohol. ? Energy drinks and sports drinks. ? Bubbly (carbonated) drinks or sodas. ? Chocolate and cocoa. ? Peppermint and mint flavorings. ? Garlic and onions. ? Horseradish. ? Spicy and acidic foods. These include peppers, chili powder, curry powder, vinegar, hot sauces, and BBQ sauce. ? Citrus fruit juices and citrus fruits, such as oranges, lemons, and limes. ? Tomato-based foods. These include red sauce, chili, salsa, and pizza with red sauce. ? Fried and fatty foods. These include donuts, french fries, potato chips, and high-fat dressings. ? High-fat meats. These include hot dogs, rib eye steak, sausage, ham, and bacon.  3. Obesity with current BMI 56.5  Whitney Bauer is currently in the action stage of change. As such, her goal is to continue with weight loss efforts. She has agreed to the Marland Kitchen.   She will work on meal planning, consider going back to water aerobics, and will eliminate added dietary salt.  Exercise goals: Increased knee pain limits exercise.  Behavioral modification strategies: increasing lean protein intake, decreasing simple carbohydrates, increasing vegetables, increasing water intake, decreasing eating out, no skipping meals, meal planning and cooking strategies, keeping healthy foods in the home and planning for success.  Whitney Bauer  has agreed to follow-up with our clinic in 3 weeks. She was informed of the importance of frequent follow-up visits to maximize her success with intensive lifestyle modifications for her multiple health  conditions.   Objective:   Blood pressure 107/78, pulse 68, temperature 98 F (36.7 C), height 5\' 2"  (1.575 m), weight (!) 308 lb (139.7 kg), SpO2 97 %. Body mass index is 56.33 kg/m.  General: Cooperative, alert, well developed, in no acute distress. HEENT: Conjunctivae and lids unremarkable. Cardiovascular: Regular rhythm.  Lungs: Normal work of breathing. Neurologic: No focal deficits.   Lab Results  Component Value Date   CREATININE 1.11 (H) 06/01/2020   BUN 31 (H) 06/01/2020   NA 141 06/01/2020   K 4.1 06/01/2020   CL 99 06/01/2020   CO2 23 06/01/2020   Lab Results  Component Value Date   ALT 16 06/01/2020   AST 16 06/01/2020   ALKPHOS 72 06/01/2020   BILITOT 0.4 06/01/2020   Lab Results  Component Value Date   HGBA1C 5.6 02/02/2020   HGBA1C 5.4 04/08/2018   Lab Results  Component Value Date   INSULIN 5.3 02/02/2020   INSULIN 4.4 04/08/2018   Lab Results  Component Value Date   TSH 1.650 02/02/2020   Lab Results  Component Value Date   CHOL 262 (H) 06/01/2020   HDL 91 06/01/2020   LDLCALC 158 (H) 06/01/2020   TRIG 80 06/01/2020   Lab Results  Component Value Date   WBC 4.5 12/20/2019   HGB 12.2 12/20/2019   HCT 37.6 12/20/2019   MCV 90.4 12/20/2019   PLT 216 12/20/2019   Attestation Statements:   Reviewed by clinician on day of visit: allergies, medications, problem list, medical history, surgical history, family history, social history, and previous encounter notes.  Time spent on visit including pre-visit chart review and post-visit care and charting was 20 minutes.   I, 12/22/2019, CMA, am acting as Insurance claims handler for Energy manager, DO  I have reviewed the above documentation for accuracy and completeness, and I agree with the above. [employer], DO

## 2020-07-11 NOTE — Progress Notes (Signed)
My knees still hurt.  My back is better.  I spent a good 30 minutes with her today discussing her various joint pains, mainly the knees.  She uses a cane. Her work has still not made all the accommodations I have requested.  It is still under review.  She asked about various over the counter drugs to try for her knees.  I have recommended tumeric as one.  Using the tylenol at times will help also.  We talked about a foot pedal system to use.  I think she should try it but not over do it.  Encounter Diagnoses  Name Primary?  . Bilateral primary osteoarthritis of knee Yes  . Body mass index 50.0-59.9, adult (HCC)   . Morbid obesity due to excess calories (HCC)    I will see her in six weeks.  Call if any problem.  Precautions discussed.   Electronically Signed Darreld Mclean, MD 4/19/20224:04 PM

## 2020-08-01 ENCOUNTER — Ambulatory Visit: Payer: No Typology Code available for payment source | Admitting: Orthopaedic Surgery

## 2020-08-03 ENCOUNTER — Ambulatory Visit (INDEPENDENT_AMBULATORY_CARE_PROVIDER_SITE_OTHER): Payer: No Typology Code available for payment source | Admitting: Bariatrics

## 2020-08-03 ENCOUNTER — Other Ambulatory Visit: Payer: Self-pay

## 2020-08-03 ENCOUNTER — Encounter (INDEPENDENT_AMBULATORY_CARE_PROVIDER_SITE_OTHER): Payer: Self-pay | Admitting: Bariatrics

## 2020-08-03 VITALS — BP 135/82 | HR 67 | Temp 98.0°F | Ht 62.0 in | Wt 310.0 lb

## 2020-08-03 DIAGNOSIS — E559 Vitamin D deficiency, unspecified: Secondary | ICD-10-CM

## 2020-08-03 DIAGNOSIS — Z9189 Other specified personal risk factors, not elsewhere classified: Secondary | ICD-10-CM

## 2020-08-03 DIAGNOSIS — I10 Essential (primary) hypertension: Secondary | ICD-10-CM | POA: Diagnosis not present

## 2020-08-03 DIAGNOSIS — E662 Morbid (severe) obesity with alveolar hypoventilation: Secondary | ICD-10-CM | POA: Diagnosis not present

## 2020-08-03 DIAGNOSIS — Z6841 Body Mass Index (BMI) 40.0 and over, adult: Secondary | ICD-10-CM

## 2020-08-03 MED ORDER — VITAMIN D (ERGOCALCIFEROL) 1.25 MG (50000 UNIT) PO CAPS: 50000.0000 [IU] | ORAL_CAPSULE | ORAL | 0 refills | Status: DC

## 2020-08-07 NOTE — Progress Notes (Signed)
Chief Complaint:   OBESITY Whitney Bauer is here to discuss her progress with her obesity treatment plan along with follow-up of her obesity related diagnoses. Whitney Bauer is on the Pescatarian Plan and states she is following her eating plan approximately 0% of the time. Whitney Bauer states she is not currently exercising.  Today's visit was #: 13 Starting weight: 336 lbs Starting date: 04/08/2018 Today's weight: 310 lbs Today's date: 08/03/2020 Total lbs lost to date: 26 Total lbs lost since last in-office visit: 0  Interim History: Whitney Bauer is up 2 lbs since her last visit but has done relatively well overall. She is getting adequate water.  Subjective:   1. Vitamin D deficiency Pt denies nausea, vomiting, and muscle weakness.  2. Essential hypertension Pt is on Tenoretic. Her BP is controlled, but she has not taken her meds today.  BP Readings from Last 3 Encounters:  08/03/20 135/82  07/11/20 123/73  07/06/20 107/78   3. At risk for osteoporosis Whitney Bauer is at higher risk of osteopenia and osteoporosis due to Vitamin D deficiency.   Assessment/Plan:   1. Vitamin D deficiency Low Vitamin D level contributes to fatigue and are associated with obesity, breast, and colon cancer. She agrees to continue to take prescription Vitamin D @50 ,000 IU every week and will follow-up for routine testing of Vitamin D, at least 2-3 times per year to avoid over-replacement.  - Vitamin D, Ergocalciferol, (DRISDOL) 1.25 MG (50000 UNIT) CAPS capsule; Take 1 capsule (50,000 Units total) by mouth every 7 (seven) days.  Dispense: 4 capsule; Refill: 0  2. Essential hypertension Whitney Bauer is working on healthy weight loss and exercise to improve blood pressure control. We will watch for signs of hypotension as she continues her lifestyle modifications. Continue current anti-hypertensive regimen.  3. At risk for osteoporosis Whitney Bauer was given approximately 15 minutes of osteoporosis prevention counseling today. Whitney Bauer is at risk for  osteopenia and osteoporosis due to her Vitamin D deficiency. She was encouraged to take her Vitamin D and follow her higher calcium diet and increase strengthening exercise to help strengthen her bones and decrease her risk of osteopenia and osteoporosis.  Repetitive spaced learning was employed today to elicit superior memory formation and behavioral change.  4. Obesity, current BMI 56  Whitney Bauer is currently in the action stage of change. As such, her goal is to continue with weight loss efforts. She has agreed to practicing portion control and making smarter food choices, such as increasing vegetables and decreasing simple carbohydrates.   Meal plan Will stick closely to the plan.  Exercise goals: Will try to be more active.  Behavioral modification strategies: increasing lean protein intake, decreasing simple carbohydrates, increasing vegetables, increasing water intake, decreasing eating out, no skipping meals, meal planning and cooking strategies, keeping healthy foods in the home and planning for success.  Whitney Bauer has agreed to follow-up with our clinic in 2-3 weeks. She was informed of the importance of frequent follow-up visits to maximize her success with intensive lifestyle modifications for her multiple health conditions.   Objective:   Blood pressure 135/82, pulse 67, temperature 98 F (36.7 C), height 5\' 2"  (1.575 m), weight (!) 310 lb (140.6 kg), SpO2 99 %. Body mass index is 56.7 kg/m.  General: Cooperative, alert, well developed, in no acute distress. HEENT: Conjunctivae and lids unremarkable. Cardiovascular: Regular rhythm.  Lungs: Normal work of breathing. Neurologic: No focal deficits.   Lab Results  Component Value Date   CREATININE 1.11 (H) 06/01/2020   BUN  31 (H) 06/01/2020   NA 141 06/01/2020   K 4.1 06/01/2020   CL 99 06/01/2020   CO2 23 06/01/2020   Lab Results  Component Value Date   ALT 16 06/01/2020   AST 16 06/01/2020   ALKPHOS 72 06/01/2020   BILITOT  0.4 06/01/2020   Lab Results  Component Value Date   HGBA1C 5.6 02/02/2020   HGBA1C 5.4 04/08/2018   Lab Results  Component Value Date   INSULIN 5.3 02/02/2020   INSULIN 4.4 04/08/2018   Lab Results  Component Value Date   TSH 1.650 02/02/2020   Lab Results  Component Value Date   CHOL 262 (H) 06/01/2020   HDL 91 06/01/2020   LDLCALC 158 (H) 06/01/2020   TRIG 80 06/01/2020   Lab Results  Component Value Date   WBC 4.5 12/20/2019   HGB 12.2 12/20/2019   HCT 37.6 12/20/2019   MCV 90.4 12/20/2019   PLT 216 12/20/2019   No results found for: IRON, TIBC, FERRITIN   Attestation Statements:   Reviewed by clinician on day of visit: allergies, medications, problem list, medical history, surgical history, family history, social history, and previous encounter notes.  Edmund Hilda, CMA, am acting as Energy manager for [employer], DO.  I have reviewed the above documentation for accuracy and completeness, and I agree with the above. Corinna Capra, DO

## 2020-08-24 ENCOUNTER — Ambulatory Visit (INDEPENDENT_AMBULATORY_CARE_PROVIDER_SITE_OTHER): Payer: No Typology Code available for payment source | Admitting: Bariatrics

## 2020-08-24 ENCOUNTER — Other Ambulatory Visit: Payer: Self-pay

## 2020-08-24 ENCOUNTER — Ambulatory Visit: Payer: No Typology Code available for payment source | Admitting: Orthopaedic Surgery

## 2020-08-24 ENCOUNTER — Encounter: Payer: Self-pay | Admitting: Orthopaedic Surgery

## 2020-08-24 VITALS — BP 109/72 | HR 64 | Ht 62.0 in | Wt 307.0 lb

## 2020-08-24 DIAGNOSIS — G8929 Other chronic pain: Secondary | ICD-10-CM

## 2020-08-24 DIAGNOSIS — Z6841 Body Mass Index (BMI) 40.0 and over, adult: Secondary | ICD-10-CM

## 2020-08-24 DIAGNOSIS — M25562 Pain in left knee: Secondary | ICD-10-CM | POA: Diagnosis not present

## 2020-08-24 MED ORDER — OXYCODONE-ACETAMINOPHEN 5-325 MG PO TABS: 1.0000 | ORAL_TABLET | ORAL | 0 refills | Status: DC | PRN

## 2020-08-24 NOTE — Progress Notes (Signed)
PROCEDURE NOTE:  The patient requests injections of the left knee , verbal consent was obtained.  The left knee was prepped appropriately after time out was performed.   Sterile technique was observed and injection of 1 cc of DepoMedrol 40 mg with several cc's of plain xylocaine. Anesthesia was provided by ethyl chloride and a 20-gauge needle was used to inject the knee area. The injection was tolerated well.  A band aid dressing was applied.  The patient was advised to apply ice later today and tomorrow to the injection sight as needed.  I have reviewed the [place] Controlled Substance Reporting System web site prior to prescribing narcotic medicine for this patient.   Call if any problem.  Precautions discussed.   Return in six weeks.  Electronically Signed Darreld Mclean, MD 6/2/20228:49 AM

## 2020-08-24 NOTE — Patient Instructions (Signed)
Note for out of work earlier this week

## 2020-09-03 ENCOUNTER — Telehealth: Payer: Self-pay | Admitting: Orthopaedic Surgery

## 2020-09-09 ENCOUNTER — Other Ambulatory Visit (INDEPENDENT_AMBULATORY_CARE_PROVIDER_SITE_OTHER): Payer: Self-pay | Admitting: Bariatrics

## 2020-09-09 DIAGNOSIS — E559 Vitamin D deficiency, unspecified: Secondary | ICD-10-CM

## 2020-09-14 ENCOUNTER — Encounter (INDEPENDENT_AMBULATORY_CARE_PROVIDER_SITE_OTHER): Payer: Self-pay | Admitting: Bariatrics

## 2020-09-14 ENCOUNTER — Ambulatory Visit (INDEPENDENT_AMBULATORY_CARE_PROVIDER_SITE_OTHER): Payer: No Typology Code available for payment source | Admitting: Bariatrics

## 2020-09-14 ENCOUNTER — Other Ambulatory Visit: Payer: Self-pay

## 2020-09-14 VITALS — BP 147/86 | HR 78 | Temp 98.0°F | Ht 62.0 in | Wt 298.0 lb

## 2020-09-14 DIAGNOSIS — Z9189 Other specified personal risk factors, not elsewhere classified: Secondary | ICD-10-CM | POA: Diagnosis not present

## 2020-09-14 DIAGNOSIS — Z6841 Body Mass Index (BMI) 40.0 and over, adult: Secondary | ICD-10-CM

## 2020-09-14 DIAGNOSIS — I10 Essential (primary) hypertension: Secondary | ICD-10-CM | POA: Diagnosis not present

## 2020-09-14 DIAGNOSIS — E559 Vitamin D deficiency, unspecified: Secondary | ICD-10-CM

## 2020-09-14 MED ORDER — VITAMIN D (ERGOCALCIFEROL) 1.25 MG (50000 UNIT) PO CAPS: 50000.0000 [IU] | ORAL_CAPSULE | ORAL | 0 refills | Status: DC

## 2020-09-20 NOTE — Progress Notes (Signed)
Chief Complaint:   OBESITY Whitney Bauer is here to discuss her progress with her obesity treatment plan along with follow-up of her obesity related diagnoses. Whitney Bauer is on the Pescatarian Plan and states she is following her eating plan approximately 60% of the time. Whitney Bauer states she is not currently exercising.  Today's visit was #: 14 Starting weight: 336 lbs Starting date: 04/08/2018 Today's weight: 298 lbs Today's date: 09/14/2020 Total lbs lost to date: 38 Total lbs lost since last in-office visit: 12  Interim History: Whitney Bauer is down an additional 12 lbs. She did get more protein.  Subjective:   1. Vitamin D deficiency She is currently taking prescription vitamin D 50,000 IU each week. She denies nausea, vomiting or muscle weakness.  Lab Results  Component Value Date   VD25OH 20.3 (L) 06/01/2020   VD25OH 9.5 (L) 02/02/2020   VD25OH 6.1 (L) 04/08/2018   2. Essential hypertension Whitney Bauer's BP is reasonably well controlled.  BP Readings from Last 3 Encounters:  09/14/20 (!) 147/86  08/24/20 109/72  08/03/20 135/82   3. At risk for heart disease Whitney Bauer is at a higher than average risk for cardiovascular disease due to obesity.   Assessment/Plan:   1. Vitamin D deficiency Low Vitamin D level contributes to fatigue and are associated with obesity, breast, and colon cancer. She agrees to continue to take prescription Vitamin D @50 ,000 IU every week and will follow-up for routine testing of Vitamin D, at least 2-3 times per year to avoid over-replacement.  Refill- Vitamin D, Ergocalciferol, (DRISDOL) 1.25 MG (50000 UNIT) CAPS capsule; Take 1 capsule (50,000 Units total) by mouth every 7 (seven) days.  Dispense: 4 capsule; Refill: 0  2. Essential hypertension Whitney Bauer is working on healthy weight loss and exercise to improve blood pressure control. We will watch for signs of hypotension as she continues her lifestyle modifications.  3. At risk for heart disease Whitney Bauer was given  approximately 15 minutes of coronary artery disease prevention counseling today. She is 65 y.o. female and has risk factors for heart disease including obesity. We discussed intensive lifestyle modifications today with an emphasis on specific weight loss instructions and strategies.   Repetitive spaced learning was employed today to elicit superior memory formation and behavioral change.  4. Obesity,  current BMI 54.6  Whitney Bauer is currently in the action stage of change. As such, her goal is to continue with weight loss efforts. She has agreed to the 68.   Meal plan Will continue to adhere closely to the plan.  Exercise goals:  As is  Behavioral modification strategies: increasing lean protein intake, decreasing simple carbohydrates, increasing vegetables, increasing water intake, decreasing eating out, no skipping meals, meal planning and cooking strategies, keeping healthy foods in the home, and planning for success.  Whitney Bauer has agreed to follow-up with our clinic in 3 weeks. She was informed of the importance of frequent follow-up visits to maximize her success with intensive lifestyle modifications for her multiple health conditions.   Objective:   Blood pressure (!) 147/86, pulse 78, temperature 98 F (36.7 C), height 5\' 2"  (1.575 m), weight 298 lb (135.2 kg), SpO2 95 %. Body mass index is 54.5 kg/m.  General: Cooperative, alert, well developed, in no acute distress. Using a cane for ambulation. HEENT: Conjunctivae and lids unremarkable. Cardiovascular: Regular rhythm.  Lungs: Normal work of breathing. Neurologic: No focal deficits.   Lab Results  Component Value Date   CREATININE 1.11 (H) 06/01/2020   BUN 31 (H)  06/01/2020   NA 141 06/01/2020   K 4.1 06/01/2020   CL 99 06/01/2020   CO2 23 06/01/2020   Lab Results  Component Value Date   ALT 16 06/01/2020   AST 16 06/01/2020   ALKPHOS 72 06/01/2020   BILITOT 0.4 06/01/2020   Lab Results  Component Value  Date   HGBA1C 5.6 02/02/2020   HGBA1C 5.4 04/08/2018   Lab Results  Component Value Date   INSULIN 5.3 02/02/2020   INSULIN 4.4 04/08/2018   Lab Results  Component Value Date   TSH 1.650 02/02/2020   Lab Results  Component Value Date   CHOL 262 (H) 06/01/2020   HDL 91 06/01/2020   LDLCALC 158 (H) 06/01/2020   TRIG 80 06/01/2020   Lab Results  Component Value Date   VD25OH 20.3 (L) 06/01/2020   VD25OH 9.5 (L) 02/02/2020   VD25OH 6.1 (L) 04/08/2018   Lab Results  Component Value Date   WBC 4.5 12/20/2019   HGB 12.2 12/20/2019   HCT 37.6 12/20/2019   MCV 90.4 12/20/2019   PLT 216 12/20/2019   No results found for: IRON, TIBC, FERRITIN  Attestation Statements:   Reviewed by clinician on day of visit: allergies, medications, problem list, medical history, surgical history, family history, social history, and previous encounter notes.  Edmund Hilda, CMA, am acting as Energy manager for [employer], DO.  I have reviewed the above documentation for accuracy and completeness, and I agree with the above. Corinna Capra, DO

## 2020-09-21 ENCOUNTER — Encounter (INDEPENDENT_AMBULATORY_CARE_PROVIDER_SITE_OTHER): Payer: Self-pay | Admitting: Bariatrics

## 2020-10-05 ENCOUNTER — Ambulatory Visit (INDEPENDENT_AMBULATORY_CARE_PROVIDER_SITE_OTHER): Payer: No Typology Code available for payment source | Admitting: Orthopaedic Surgery

## 2020-10-05 ENCOUNTER — Encounter: Payer: Self-pay | Admitting: Orthopaedic Surgery

## 2020-10-05 ENCOUNTER — Ambulatory Visit (INDEPENDENT_AMBULATORY_CARE_PROVIDER_SITE_OTHER): Payer: No Typology Code available for payment source | Admitting: Bariatrics

## 2020-10-05 ENCOUNTER — Encounter (INDEPENDENT_AMBULATORY_CARE_PROVIDER_SITE_OTHER): Payer: Self-pay | Admitting: Bariatrics

## 2020-10-05 ENCOUNTER — Other Ambulatory Visit: Payer: Self-pay

## 2020-10-05 VITALS — BP 149/84 | HR 73 | Ht 62.0 in | Wt 303.0 lb

## 2020-10-05 VITALS — BP 127/80 | HR 87 | Temp 98.1°F | Ht 62.0 in | Wt 298.0 lb

## 2020-10-05 DIAGNOSIS — E7800 Pure hypercholesterolemia, unspecified: Secondary | ICD-10-CM | POA: Diagnosis not present

## 2020-10-05 DIAGNOSIS — M170 Bilateral primary osteoarthritis of knee: Secondary | ICD-10-CM

## 2020-10-05 DIAGNOSIS — Z6841 Body Mass Index (BMI) 40.0 and over, adult: Secondary | ICD-10-CM | POA: Diagnosis not present

## 2020-10-05 DIAGNOSIS — E66813 Obesity, class 3: Secondary | ICD-10-CM

## 2020-10-05 DIAGNOSIS — I10 Essential (primary) hypertension: Secondary | ICD-10-CM

## 2020-10-05 NOTE — Progress Notes (Signed)
My knee is better.  She has pain of both knees, the left knee more than the right.  She is using a cane. But her pain is much improved.  She has no new trauma.  She has crepitus and effusion of both knees, ROM is 0 to 100 left, 0 to 105 right, limp left, uses a cane.  NV intact.  Encounter Diagnoses  Name Primary?   Bilateral primary osteoarthritis of knee Yes   Body mass index 50.0-59.9, adult (HCC)    Morbid obesity due to excess calories (HCC)    I will see in two months.  Call if any problem.  Precautions discussed.  Electronically Signed Darreld Mclean, MD 7/14/20229:30 AM

## 2020-10-12 ENCOUNTER — Telehealth: Payer: Self-pay | Admitting: Orthopaedic Surgery

## 2020-10-12 NOTE — Progress Notes (Signed)
Chief Complaint:   OBESITY Whitney Bauer is here to discuss her progress with her obesity treatment plan along with follow-up of her obesity related diagnoses. Whitney Bauer is on the Pescatarian Plan and states she is following her eating plan approximately 10% of the time. Whitney Bauer states she is doing 0 minutes 0 times per week.  Today's visit was #: 15 Starting weight: 336 lbs Starting date: 04/08/2018 Today's weight: 298 lbs Today's date: 10/05/2020 Total lbs lost to date: 38 lbs Total lbs lost since last in-office visit: 0  Interim History: [place] weight remains the same. She thought that she had gained. She has not been doing as much drinking as much. She is sleepy well.  Subjective:   1. Elevated cholesterol Whitney Bauer is not on any medications.  2. Essential hypertension Whitney Bauer's hypertension is controlled.  Assessment/Plan:   1. Elevated cholesterol Cardiovascular risk and specific lipid/LDL goals reviewed.  We discussed several lifestyle modifications today and Whitney Bauer will continue to work on diet, exercise and weight loss efforts. Orders and follow up as documented in patient record.   Counseling Intensive lifestyle modifications are the first line treatment for this issue. Dietary changes: Increase soluble fiber. Decrease simple carbohydrates. Exercise changes: Moderate to vigorous-intensity aerobic activity 150 minutes per week if tolerated. Lipid-lowering medications: see documented in medical record.   2. Essential hypertension Whitney Bauer will continue her medication and is working on healthy weight loss and exercise to improve blood pressure control. We will watch for signs of hypotension as she continues her lifestyle modifications.    3. Obesity,  current BMI 54.5 Whitney Bauer is currently in the action stage of change. As such, her goal is to continue with weight loss efforts. She has agreed to the [employer].   Exercise goals: No exercise has been prescribed at this time.  Behavioral  modification strategies: increasing lean protein intake, decreasing simple carbohydrates, increasing vegetables, increasing water intake, decreasing eating out, no skipping meals, meal planning and cooking strategies, keeping healthy foods in the home, and planning for success.  Whitney Bauer has agreed to follow-up with our clinic in 3-4 weeks. She was informed of the importance of frequent follow-up visits to maximize her success with intensive lifestyle modifications for her multiple health conditions.   Objective:   Blood pressure 127/80, pulse 87, temperature 98.1 F (36.7 C), height 5\' 2"  (1.575 m), weight 298 lb (135.2 kg), SpO2 96 %. Body mass index is 54.5 kg/m.  General: Cooperative, alert, well developed, in no acute distress. HEENT: Conjunctivae and lids unremarkable. Cardiovascular: Regular rhythm.  Lungs: Normal work of breathing. Neurologic: No focal deficits.   Lab Results  Component Value Date   CREATININE 1.11 (H) 06/01/2020   BUN 31 (H) 06/01/2020   NA 141 06/01/2020   K 4.1 06/01/2020   CL 99 06/01/2020   CO2 23 06/01/2020   Lab Results  Component Value Date   ALT 16 06/01/2020   AST 16 06/01/2020   ALKPHOS 72 06/01/2020   BILITOT 0.4 06/01/2020   Lab Results  Component Value Date   HGBA1C 5.6 02/02/2020   HGBA1C 5.4 04/08/2018   Lab Results  Component Value Date   INSULIN 5.3 02/02/2020   INSULIN 4.4 04/08/2018   Lab Results  Component Value Date   TSH 1.650 02/02/2020   Lab Results  Component Value Date   CHOL 262 (H) 06/01/2020   HDL 91 06/01/2020   LDLCALC 158 (H) 06/01/2020   TRIG 80 06/01/2020   Lab Results  Component  Value Date   VD25OH 20.3 (L) 06/01/2020   VD25OH 9.5 (L) 02/02/2020   VD25OH 6.1 (L) 04/08/2018   Lab Results  Component Value Date   WBC 4.5 12/20/2019   HGB 12.2 12/20/2019   HCT 37.6 12/20/2019   MCV 90.4 12/20/2019   PLT 216 12/20/2019   No results found for: IRON, TIBC, FERRITIN  Attestation Statements:    Reviewed by clinician on day of visit: allergies, medications, problem list, medical history, surgical history, family history, social history, and previous encounter notes.  Time spent on visit including pre-visit chart review and post-visit care and charting was 20 minutes.   I, Jackson Latino, RMA, am acting as Energy manager for [employer], DO.   I have reviewed the above documentation for accuracy and completeness, and I agree with the above. Corinna Capra, DO

## 2020-10-16 ENCOUNTER — Encounter (INDEPENDENT_AMBULATORY_CARE_PROVIDER_SITE_OTHER): Payer: Self-pay | Admitting: Bariatrics

## 2020-10-26 ENCOUNTER — Other Ambulatory Visit: Payer: Self-pay

## 2020-10-26 ENCOUNTER — Ambulatory Visit (INDEPENDENT_AMBULATORY_CARE_PROVIDER_SITE_OTHER): Payer: No Typology Code available for payment source | Admitting: Bariatrics

## 2020-10-26 ENCOUNTER — Encounter (INDEPENDENT_AMBULATORY_CARE_PROVIDER_SITE_OTHER): Payer: Self-pay | Admitting: Bariatrics

## 2020-10-26 VITALS — BP 142/86 | HR 75 | Temp 98.2°F | Ht 62.0 in | Wt 298.0 lb

## 2020-10-26 DIAGNOSIS — Z6841 Body Mass Index (BMI) 40.0 and over, adult: Secondary | ICD-10-CM | POA: Diagnosis not present

## 2020-10-26 DIAGNOSIS — I10 Essential (primary) hypertension: Secondary | ICD-10-CM | POA: Diagnosis not present

## 2020-10-26 DIAGNOSIS — Z9189 Other specified personal risk factors, not elsewhere classified: Secondary | ICD-10-CM

## 2020-10-26 DIAGNOSIS — E559 Vitamin D deficiency, unspecified: Secondary | ICD-10-CM | POA: Diagnosis not present

## 2020-10-26 MED ORDER — VITAMIN D (ERGOCALCIFEROL) 1.25 MG (50000 UNIT) PO CAPS: 50000.0000 [IU] | ORAL_CAPSULE | ORAL | 0 refills | Status: DC

## 2020-10-27 NOTE — Progress Notes (Signed)
Chief Complaint:   OBESITY Whitney Bauer is here to discuss her progress with her obesity treatment plan along with follow-up of her obesity related diagnoses. Whitney Bauer is on the Pescatarian Plan and states she is following her eating plan approximately 0% of the time. Whitney Bauer states she is currently not exercising.  Today's visit was #: 16 Starting weight: 336 lbs Starting date: 04/08/2018 Today's weight: 298 lbs Today's date: 10/27/2020 Total lbs lost to date: 38 lbs Total lbs lost since last in-office visit: 0 lbs  Interim History: Whitney Bauer's weight remains the same. Her appetite is low. She is getting in her protein, for the most part.  Subjective:   1. Vitamin D deficiency She is currently taking prescription vitamin D 50,000 IU each week. She denies nausea, vomiting or muscle weakness.  Lab Results  Component Value Date   VD25OH 20.3 (L) 06/01/2020   VD25OH 9.5 (L) 02/02/2020   VD25OH 6.1 (L) 04/08/2018   2. Essential hypertension Reasonably well controlled.  BP Readings from Last 3 Encounters:  10/26/20 (!) 142/86  10/05/20 127/80  10/05/20 (!) 149/84   Lab Results  Component Value Date   CREATININE 1.11 (H) 06/01/2020   CREATININE 0.84 12/20/2019   CREATININE 0.51 04/15/2011     3. At risk for activity intolerance [place] is at risk due to obesity and extreme hot weather.  Assessment/Plan:   1. Vitamin D deficiency Low Vitamin D level contributes to fatigue and are associated with obesity, breast, and colon cancer. She agrees to continue to take prescription Vitamin D @50 ,000 IU every week and will follow-up for routine testing of Vitamin D, at least 2-3 times per year to avoid over-replacement.  - Vitamin D, Ergocalciferol, (DRISDOL) 1.25 MG (50000 UNIT) CAPS capsule; Take 1 capsule (50,000 Units total) by mouth every 7 (seven) days.  Dispense: 4 capsule; Refill: 0  2. Essential hypertension Whitney Bauer is working on healthy weight loss and exercise to improve blood pressure  control. We will watch for signs of hypotension as she continues her lifestyle modifications. She will continue taking her medication.  3. At risk for activity intolerance Whitney Bauer was given approximately 15 minutes of exercise intolerance counseling today. She is 65 y.o. female and has risk factors exercise intolerance including obesity. We discussed intensive lifestyle modifications today with an emphasis on specific weight loss instructions and strategies. Whitney Bauer will slowly increase activity as tolerated.  Repetitive spaced learning was employed today to elicit superior memory formation and behavioral change.   4. Obesity,  current BMI 54.5 [name] is currently in the action stage of change. As such, her goal is to continue with weight loss efforts. She has agreed to the 68.   Meal planning Intentional eating  Exercise goals: Will go outside with her dog.  Behavioral modification strategies: increasing lean protein intake, decreasing simple carbohydrates, increasing vegetables, increasing water intake, decreasing eating out, no skipping meals, meal planning and cooking strategies, keeping healthy foods in the home, and planning for success.  Whitney Bauer has agreed to follow-up with our clinic in 3 weeks. She was informed of the importance of frequent follow-up visits to maximize her success with intensive lifestyle modifications for her multiple health conditions.   Objective:   Blood pressure (!) 142/86, pulse 75, temperature 98.2 F (36.8 C), height 5\' 2"  (1.575 m), weight 298 lb (135.2 kg), SpO2 97 %. Body mass index is 54.5 kg/m.  General: Cooperative, alert, well developed, in no acute distress. HEENT: Conjunctivae and lids unremarkable. Cardiovascular: Regular  rhythm.  Lungs: Normal work of breathing. Neurologic: No focal deficits.   Lab Results  Component Value Date   CREATININE 1.11 (H) 06/01/2020   BUN 31 (H) 06/01/2020   NA 141 06/01/2020   K 4.1 06/01/2020   CL 99  06/01/2020   CO2 23 06/01/2020   Lab Results  Component Value Date   ALT 16 06/01/2020   AST 16 06/01/2020   ALKPHOS 72 06/01/2020   BILITOT 0.4 06/01/2020   Lab Results  Component Value Date   HGBA1C 5.6 02/02/2020   HGBA1C 5.4 04/08/2018   Lab Results  Component Value Date   INSULIN 5.3 02/02/2020   INSULIN 4.4 04/08/2018   Lab Results  Component Value Date   TSH 1.650 02/02/2020   Lab Results  Component Value Date   CHOL 262 (H) 06/01/2020   HDL 91 06/01/2020   LDLCALC 158 (H) 06/01/2020   TRIG 80 06/01/2020   Lab Results  Component Value Date   VD25OH 20.3 (L) 06/01/2020   VD25OH 9.5 (L) 02/02/2020   VD25OH 6.1 (L) 04/08/2018   Lab Results  Component Value Date   WBC 4.5 12/20/2019   HGB 12.2 12/20/2019   HCT 37.6 12/20/2019   MCV 90.4 12/20/2019   PLT 216 12/20/2019   No results found for: IRON, TIBC, FERRITIN  Attestation Statements:   Reviewed by clinician on day of visit: allergies, medications, problem list, medical history, surgical history, family history, social history, and previous encounter notes.  IPaulla Fore, CMA, am acting as transcriptionist for Dr. Corinna Capra, DO.  I have reviewed the above documentation for accuracy and completeness, and I agree with the above. Corinna Capra, DO

## 2020-10-31 ENCOUNTER — Encounter (INDEPENDENT_AMBULATORY_CARE_PROVIDER_SITE_OTHER): Payer: Self-pay | Admitting: Bariatrics

## 2020-11-08 ENCOUNTER — Other Ambulatory Visit (INDEPENDENT_AMBULATORY_CARE_PROVIDER_SITE_OTHER): Payer: Self-pay | Admitting: Bariatrics

## 2020-11-08 DIAGNOSIS — E559 Vitamin D deficiency, unspecified: Secondary | ICD-10-CM

## 2020-11-16 ENCOUNTER — Ambulatory Visit (INDEPENDENT_AMBULATORY_CARE_PROVIDER_SITE_OTHER): Payer: No Typology Code available for payment source | Admitting: Bariatrics

## 2020-11-16 ENCOUNTER — Encounter (INDEPENDENT_AMBULATORY_CARE_PROVIDER_SITE_OTHER): Payer: Self-pay | Admitting: Bariatrics

## 2020-11-16 ENCOUNTER — Other Ambulatory Visit: Payer: Self-pay

## 2020-11-16 VITALS — BP 129/74 | HR 78 | Temp 98.5°F | Ht 62.0 in | Wt 305.0 lb

## 2020-11-16 DIAGNOSIS — Z6841 Body Mass Index (BMI) 40.0 and over, adult: Secondary | ICD-10-CM | POA: Diagnosis not present

## 2020-11-16 DIAGNOSIS — I10 Essential (primary) hypertension: Secondary | ICD-10-CM

## 2020-11-16 DIAGNOSIS — E7800 Pure hypercholesterolemia, unspecified: Secondary | ICD-10-CM | POA: Diagnosis not present

## 2020-11-20 ENCOUNTER — Encounter (INDEPENDENT_AMBULATORY_CARE_PROVIDER_SITE_OTHER): Payer: Self-pay | Admitting: Bariatrics

## 2020-11-20 NOTE — Progress Notes (Signed)
Chief Complaint:   OBESITY Whitney Bauer is here to discuss her progress with her obesity treatment plan along with follow-up of her obesity related diagnoses. Whitney Bauer is on the Pescatarian Plan and states she is following her eating plan approximately 0% of the time. Whitney Bauer states she is doing 0 minutes 0 times per week.  Today's visit was #: 17 Starting weight: 336 lbs Starting date: 04/08/2018 Today's weight: 305 lbs Today's date: 11/16/2020 Total lbs lost to date: 31 lbs Total lbs lost since last in-office visit: 7 lbs  Interim History: Whitney Bauer is down another 7 lbs since her last visit and doing well overall.  Subjective:   1. Essential hypertension Whitney Bauer's hypertension is currently controlled on Tenoretic.  2. Elevated cholesterol Whitney Bauer is currently not on medications.  Assessment/Plan:   1. Essential hypertension Whitney Bauer will continue her medications and she is working on healthy weight loss and exercise to improve blood pressure control. We will watch for signs of hypotension as she continues her lifestyle modifications.   2. Elevated cholesterol Cardiovascular risk and specific lipid/LDL goals reviewed.  We discussed several lifestyle modifications today and Whitney Bauer will continue her medications and will work on diet, exercise and weight loss efforts. Orders and follow up as documented in patient record.   Counseling Intensive lifestyle modifications are the first line treatment for this issue. Dietary changes: Increase soluble fiber. Decrease simple carbohydrates. Exercise changes: Moderate to vigorous-intensity aerobic activity 150 minutes per week if tolerated. Lipid-lowering medications: see documented in medical record.   3. Obesity,  current BMI 55.9 Whitney Bauer is currently in the action stage of change. As such, her goal is to continue with weight loss efforts. She has agreed to the [employer].   Whitney Bauer will continue meal planning. She will keep protein and H2O intake  high.  Exercise goals: No exercise has been prescribed at this time.  Behavioral modification strategies: increasing lean protein intake, decreasing simple carbohydrates, increasing vegetables, increasing water intake, decreasing eating out, no skipping meals, meal planning and cooking strategies, keeping healthy foods in the home, and planning for success.  Whitney Bauer has agreed to follow-up with our clinic in 4 weeks (fasting). She was informed of the importance of frequent follow-up visits to maximize her success with intensive lifestyle modifications for her multiple health conditions.   Objective:   Blood pressure 129/74, pulse 78, temperature 98.5 F (36.9 C), height 5\' 2"  (1.575 m), weight (!) 305 lb (138.3 kg), SpO2 96 %. Body mass index is 55.79 kg/m.  General: Cooperative, alert, well developed, in no acute distress. HEENT: Conjunctivae and lids unremarkable. Cardiovascular: Regular rhythm.  Lungs: Normal work of breathing. Neurologic: No focal deficits.   Lab Results  Component Value Date   CREATININE 1.11 (H) 06/01/2020   BUN 31 (H) 06/01/2020   NA 141 06/01/2020   K 4.1 06/01/2020   CL 99 06/01/2020   CO2 23 06/01/2020   Lab Results  Component Value Date   ALT 16 06/01/2020   AST 16 06/01/2020   ALKPHOS 72 06/01/2020   BILITOT 0.4 06/01/2020   Lab Results  Component Value Date   HGBA1C 5.6 02/02/2020   HGBA1C 5.4 04/08/2018   Lab Results  Component Value Date   INSULIN 5.3 02/02/2020   INSULIN 4.4 04/08/2018   Lab Results  Component Value Date   TSH 1.650 02/02/2020   Lab Results  Component Value Date   CHOL 262 (H) 06/01/2020   HDL 91 06/01/2020   LDLCALC 158 (H)  06/01/2020   TRIG 80 06/01/2020   Lab Results  Component Value Date   VD25OH 20.3 (L) 06/01/2020   VD25OH 9.5 (L) 02/02/2020   VD25OH 6.1 (L) 04/08/2018   Lab Results  Component Value Date   WBC 4.5 12/20/2019   HGB 12.2 12/20/2019   HCT 37.6 12/20/2019   MCV 90.4 12/20/2019   PLT  216 12/20/2019   No results found for: IRON, TIBC, FERRITIN  Attestation Statements:   Reviewed by clinician on day of visit: allergies, medications, problem list, medical history, surgical history, family history, social history, and previous encounter notes.  Time spent on visit including pre-visit chart review and post-visit care and charting was 20 minutes.   I, Jackson Latino, RMA, am acting as Energy manager for [employer], DO.   I have reviewed the above documentation for accuracy and completeness, and I agree with the above. Corinna Capra, DO

## 2020-12-07 ENCOUNTER — Encounter: Payer: Self-pay | Admitting: Orthopaedic Surgery

## 2020-12-07 ENCOUNTER — Ambulatory Visit: Payer: No Typology Code available for payment source | Admitting: Orthopaedic Surgery

## 2020-12-07 ENCOUNTER — Other Ambulatory Visit: Payer: Self-pay

## 2020-12-07 VITALS — BP 156/94 | HR 68 | Ht 62.0 in | Wt 312.8 lb

## 2020-12-07 DIAGNOSIS — Z6841 Body Mass Index (BMI) 40.0 and over, adult: Secondary | ICD-10-CM | POA: Diagnosis not present

## 2020-12-07 DIAGNOSIS — M170 Bilateral primary osteoarthritis of knee: Secondary | ICD-10-CM | POA: Diagnosis not present

## 2020-12-07 MED ORDER — OXYCODONE-ACETAMINOPHEN 5-325 MG PO TABS: 1.0000 | ORAL_TABLET | ORAL | 0 refills | Status: DC | PRN

## 2020-12-07 NOTE — Progress Notes (Signed)
My knees hurt.  She has chronic knee pain, more on the left. She uses a cane and at work a walker. She has no new trauma.  She has swelling and some giving way.  She is taking her medicine.  Both knees are tender, have effusion and crepitus. ROM left 0 to 100, right 0 to 105.  NV intact.  She is using a cane.  Limp to the left.  Encounter Diagnoses  Name Primary?   Bilateral primary osteoarthritis of knee Yes   Body mass index 50.0-59.9, adult (HCC)    Morbid obesity due to excess calories (HCC)    I will refill her pain medicine.  I have reviewed the [place] Controlled Substance Reporting System web site prior to prescribing narcotic medicine for this patient.  Return in three months.  She has some cramps at time of the right foot and ankle.  I have suggested mustard.  Electronically Signed Darreld Mclean, MD 9/15/20229:44 AM

## 2020-12-21 ENCOUNTER — Ambulatory Visit (INDEPENDENT_AMBULATORY_CARE_PROVIDER_SITE_OTHER): Payer: No Typology Code available for payment source | Admitting: Bariatrics

## 2021-01-04 ENCOUNTER — Encounter (INDEPENDENT_AMBULATORY_CARE_PROVIDER_SITE_OTHER): Payer: Self-pay | Admitting: Bariatrics

## 2021-01-04 ENCOUNTER — Other Ambulatory Visit: Payer: Self-pay

## 2021-01-04 ENCOUNTER — Ambulatory Visit (INDEPENDENT_AMBULATORY_CARE_PROVIDER_SITE_OTHER): Payer: No Typology Code available for payment source | Admitting: Bariatrics

## 2021-01-04 VITALS — BP 149/85 | HR 71 | Temp 98.0°F | Ht 62.0 in | Wt 300.0 lb

## 2021-01-04 DIAGNOSIS — I10 Essential (primary) hypertension: Secondary | ICD-10-CM

## 2021-01-04 DIAGNOSIS — M1990 Unspecified osteoarthritis, unspecified site: Secondary | ICD-10-CM

## 2021-01-04 DIAGNOSIS — Z6841 Body Mass Index (BMI) 40.0 and over, adult: Secondary | ICD-10-CM

## 2021-01-04 NOTE — Progress Notes (Signed)
Chief Complaint:   OBESITY Bri is here to discuss her progress with her obesity treatment plan along with follow-up of her obesity related diagnoses. Atalya is on the [place] and states she is following her eating plan approximately 10% of the time. Morenike states she is doing 0 minutes 0 times per week.  Today's visit was #: 75 Starting weight: 336 lbs Starting date: 04/08/2018 Today's weight: 300 lbs Today's date: 01/04/2021 Total lbs lost to date: 36 lbs Total lbs lost since last in-office visit: 5 lbs  Interim History: Kemia is down 5 additional pounds since her last visit. She is getting in enough water.   Subjective:   1. Other type of osteoarthritis, unspecified site Elani walks with an antalgic gait.   2. Essential hypertension Drucella is not taking medications currently. Her blood pressure was slightly elevated.   Assessment/Plan:   1. Other type of osteoarthritis, unspecified site We will continue to monitor. Eleana will continue medications as tolerated. She will continue chair exercise and stretching. Orders and follow up as documented in patient record.  Counseling Osteoporosis happens when your bones get thin and weak. This can cause your bones to break (fracture) more easily.  Exercise is very important to keep bones strong. Focus on strength training (lifting weights) and exercises that make your muscles work to hold your body weight up (weight-bearing exercises). These include tai chi, yoga, and walking.  Limit alcohol intake to no more than 1 drink a day for nonpregnant women and 2 drinks a day for men. One drink equals 12 oz of beer, 5 oz of wine, or 1 oz of hard liquor. Do not use any products that have nicotine or tobacco in them.  Preventing falls Use tools to help you move around (mobility aids) as needed. These include canes, walkers, scooters, and crutches. Keep rooms well-lit and free of clutter. Wear shoes that fit you well and support your  feet. Eat plenty of calcium and Vitamin D as these nutrients are good for your bones.     2. Essential hypertension Digna will have zero added salt. She will have her primary care provider to continue to monitor her blood pressure. She is working on healthy weight loss and exercise to improve blood pressure control. We will watch for signs of hypotension as she continues her lifestyle modifications.   3. Obesity,  current BMI 54.9 Joycelyn is currently in the action stage of change. As such, her goal is to continue with weight loss efforts. She has agreed to the [employer].   Alejandria will continue meal planning. She will continue to decrease carbohydrates.   Exercise goals: No exercise has been prescribed at this time.  Behavioral modification strategies: increasing lean protein intake, decreasing simple carbohydrates, increasing vegetables, increasing water intake, decreasing eating out, no skipping meals, meal planning and cooking strategies, keeping healthy foods in the home, and planning for success.  [place] has agreed to follow-up with our clinic in 4 weeks. She was informed of the importance of frequent follow-up visits to maximize her success with intensive lifestyle modifications for her multiple health conditions.   Objective:   Blood pressure (!) 149/85, pulse 71, temperature 98 F (36.7 C), height 5' 2"  (1.575 m), weight 300 lb (136.1 kg), SpO2 100 %. Body mass index is 54.87 kg/m.  General: Cooperative, alert, well developed, in no acute distress. HEENT: Conjunctivae and lids unremarkable. Cardiovascular: Regular rhythm.  Lungs: Normal work of breathing. Neurologic: No focal deficits.  Lab Results  Component Value Date   CREATININE 1.11 (H) 06/01/2020   BUN 31 (H) 06/01/2020   NA 141 06/01/2020   K 4.1 06/01/2020   CL 99 06/01/2020   CO2 23 06/01/2020   Lab Results  Component Value Date   ALT 16 06/01/2020   AST 16 06/01/2020   ALKPHOS 72 06/01/2020   BILITOT  0.4 06/01/2020   Lab Results  Component Value Date   HGBA1C 5.6 02/02/2020   HGBA1C 5.4 04/08/2018   Lab Results  Component Value Date   INSULIN 5.3 02/02/2020   INSULIN 4.4 04/08/2018   Lab Results  Component Value Date   TSH 1.650 02/02/2020   Lab Results  Component Value Date   CHOL 262 (H) 06/01/2020   HDL 91 06/01/2020   LDLCALC 158 (H) 06/01/2020   TRIG 80 06/01/2020   Lab Results  Component Value Date   VD25OH 20.3 (L) 06/01/2020   VD25OH 9.5 (L) 02/02/2020   VD25OH 6.1 (L) 04/08/2018   Lab Results  Component Value Date   WBC 4.5 12/20/2019   HGB 12.2 12/20/2019   HCT 37.6 12/20/2019   MCV 90.4 12/20/2019   PLT 216 12/20/2019   No results found for: IRON, TIBC, FERRITIN  Attestation Statements:   Reviewed by clinician on day of visit: allergies, medications, problem list, medical history, surgical history, family history, social history, and previous encounter notes.   I, Lizbeth Bark, RMA, am acting as Location manager for [employer], DO.   I have reviewed the above documentation for accuracy and completeness, and I agree with the above. Jearld Lesch, DO

## 2021-01-09 ENCOUNTER — Encounter: Payer: Self-pay | Admitting: Orthopaedic Surgery

## 2021-01-09 ENCOUNTER — Encounter (INDEPENDENT_AMBULATORY_CARE_PROVIDER_SITE_OTHER): Payer: Self-pay | Admitting: Bariatrics

## 2021-01-09 ENCOUNTER — Other Ambulatory Visit: Payer: Self-pay

## 2021-01-09 ENCOUNTER — Ambulatory Visit (INDEPENDENT_AMBULATORY_CARE_PROVIDER_SITE_OTHER): Payer: No Typology Code available for payment source | Admitting: Orthopaedic Surgery

## 2021-01-09 VITALS — BP 165/95 | HR 69 | Ht 62.0 in | Wt 307.8 lb

## 2021-01-09 DIAGNOSIS — M65341 Trigger finger, right ring finger: Secondary | ICD-10-CM | POA: Diagnosis not present

## 2021-01-09 NOTE — Progress Notes (Signed)
My finger is locking  She has developed a trigger finger of the right ring finger.  It is locking in the mornings.  It has been present about six weeks getting worse. She has no trauma, no redness.  She has popping and locking of the right dominant ring finger at the A1 pulley.  NV intact.  Encounter Diagnosis  Name Primary?   Trigger finger, right ring finger Yes   PROCEDURE  Trigger Finger Injection  The right Ring finger  has been locking at the A1 pulley.  The patient has been told about injection of the digit.  Surgical correction and excision of the A1 pulley will resolve the problem.  Ani injection in the digit should help but the results may be short lived.  The patient asked appropriate questions and understands the procedure.  The patient has elected for an injection at this time.  Verbal consent was obtained.  A timeout was taken to confirm the proper hand and digit.  Medication  1 mL of Celestone 6 mg  2 mL of 1% lidocaine plain  Ethyl chloride for anesthesia  Alcohol was used to prepare the skin along with ethyl chloride and then the injection was made at the A1 pulley there were no complications.  It was tolerated well.  A Band-aid dressing was applied.  Call if any problem or difficulty.   I have told her surgery is the definitive treatment.  I will see her prn for this.  Call if any problem.  Precautions discussed.  Electronically Signed Darreld Mclean, MD 10/18/20228:24 AM

## 2021-02-01 ENCOUNTER — Encounter (INDEPENDENT_AMBULATORY_CARE_PROVIDER_SITE_OTHER): Payer: Self-pay | Admitting: Bariatrics

## 2021-02-01 ENCOUNTER — Ambulatory Visit (INDEPENDENT_AMBULATORY_CARE_PROVIDER_SITE_OTHER): Payer: No Typology Code available for payment source | Admitting: Bariatrics

## 2021-02-01 ENCOUNTER — Other Ambulatory Visit: Payer: Self-pay

## 2021-02-01 VITALS — BP 108/73 | HR 71 | Temp 98.2°F | Ht 62.0 in | Wt 299.0 lb

## 2021-02-01 DIAGNOSIS — E7800 Pure hypercholesterolemia, unspecified: Secondary | ICD-10-CM | POA: Diagnosis not present

## 2021-02-01 DIAGNOSIS — Z6841 Body Mass Index (BMI) 40.0 and over, adult: Secondary | ICD-10-CM

## 2021-02-01 DIAGNOSIS — E559 Vitamin D deficiency, unspecified: Secondary | ICD-10-CM

## 2021-02-01 MED ORDER — VITAMIN D (ERGOCALCIFEROL) 1.25 MG (50000 UNIT) PO CAPS: 50000.0000 [IU] | ORAL_CAPSULE | ORAL | 0 refills | Status: DC

## 2021-02-01 NOTE — Progress Notes (Signed)
Chief Complaint:   OBESITY Whitney Bauer is here to discuss her progress with her obesity treatment plan along with follow-up of her obesity related diagnoses. Whitney Bauer is on the Pescatarian Plan and states she is following her eating plan approximately 30% of the time. Whitney Bauer states she is doing 0 minutes 0 times per week.  Today's visit was #: 19 Starting weight: 336 lbs Starting date: 04/08/2018 Today's weight: 299 lbs Today's date: 02/01/2021 Total lbs lost to date: 37 lbs Total lbs lost since last in-office visit: 1 lb  Interim History: Whitney Bauer is down 1 lb. She is getting in adequate water and protein.  Subjective:   1. Vitamin D deficiency Whitney Bauer is taking Vitamin D as directed.  2. Elevated cholesterol Whitney Bauer is currently not on medications.  Assessment/Plan:   1. Vitamin D deficiency Low Vitamin D level contributes to fatigue and are associated with obesity, breast, and colon cancer. We will refill prescription Vitamin D 50,000 IU every week for 3 months with no refills and Whitney Bauer will follow-up for routine testing of Vitamin D, at least 2-3 times per year to avoid over-replacement.  - Vitamin D, Ergocalciferol, (DRISDOL) 1.25 MG (50000 UNIT) CAPS capsule; Take 1 capsule (50,000 Units total) by mouth every 7 (seven) days.  Dispense: 4 capsule; Refill: 0  2. Elevated cholesterol Cardiovascular risk and specific lipid/LDL goals reviewed.  We discussed several lifestyle modifications today and Whitney Bauer will continue to work on the plan, exercise and weight loss efforts. Orders and follow up as documented in patient record.   Counseling Intensive lifestyle modifications are the first line treatment for this issue. Dietary changes: Increase soluble fiber. Decrease simple carbohydrates. Exercise changes: Moderate to vigorous-intensity aerobic activity 150 minutes per week if tolerated. Lipid-lowering medications: see documented in medical record.   3. Obesity,  current BMI 54.7 Whitney Bauer is  currently in the action stage of change. As such, her goal is to continue with weight loss efforts. She has agreed to the [employer].   Exercise goals: No exercise has been prescribed at this time due to left knee pain.  Behavioral modification strategies: increasing lean protein intake, decreasing simple carbohydrates, increasing vegetables, increasing water intake, decreasing eating out, no skipping meals, meal planning and cooking strategies, keeping healthy foods in the home, and planning for success.  Whitney Bauer has agreed to follow-up with our clinic in 6 weeks with myself. She was informed of the importance of frequent follow-up visits to maximize her success with intensive lifestyle modifications for her multiple health conditions.   Objective:   Blood pressure 108/73, pulse 71, temperature 98.2 F (36.8 C), height 5\' 2"  (1.575 m), weight 299 lb (135.6 kg), SpO2 100 %. Body mass index is 54.69 kg/m. Whitney Bauer is using her walker.  General: Cooperative, alert, well developed, in no acute distress. HEENT: Conjunctivae and lids unremarkable. Cardiovascular: Regular rhythm.  Lungs: Normal work of breathing. Neurologic: No focal deficits.   Lab Results  Component Value Date   CREATININE 1.11 (H) 06/01/2020   BUN 31 (H) 06/01/2020   NA 141 06/01/2020   K 4.1 06/01/2020   CL 99 06/01/2020   CO2 23 06/01/2020   Lab Results  Component Value Date   ALT 16 06/01/2020   AST 16 06/01/2020   ALKPHOS 72 06/01/2020   BILITOT 0.4 06/01/2020   Lab Results  Component Value Date   HGBA1C 5.6 02/02/2020   HGBA1C 5.4 04/08/2018   Lab Results  Component Value Date   INSULIN 5.3 02/02/2020  INSULIN 4.4 04/08/2018   Lab Results  Component Value Date   TSH 1.650 02/02/2020   Lab Results  Component Value Date   CHOL 262 (H) 06/01/2020   HDL 91 06/01/2020   LDLCALC 158 (H) 06/01/2020   TRIG 80 06/01/2020   Lab Results  Component Value Date   VD25OH 20.3 (L) 06/01/2020   VD25OH  9.5 (L) 02/02/2020   VD25OH 6.1 (L) 04/08/2018   Lab Results  Component Value Date   WBC 4.5 12/20/2019   HGB 12.2 12/20/2019   HCT 37.6 12/20/2019   MCV 90.4 12/20/2019   PLT 216 12/20/2019   No results found for: IRON, TIBC, FERRITIN  Attestation Statements:   Reviewed by clinician on day of visit: allergies, medications, problem list, medical history, surgical history, family history, social history, and previous encounter notes.  I, Jackson Latino, RMA, am acting as Energy manager for [employer], DO.   I have reviewed the above documentation for accuracy and completeness, and I agree with the above. Corinna Capra, DO

## 2021-02-02 ENCOUNTER — Encounter (INDEPENDENT_AMBULATORY_CARE_PROVIDER_SITE_OTHER): Payer: Self-pay | Admitting: Bariatrics

## 2021-02-26 ENCOUNTER — Telehealth: Payer: Self-pay

## 2021-02-26 MED ORDER — OXYCODONE-ACETAMINOPHEN 5-325 MG PO TABS: 1.0000 | ORAL_TABLET | ORAL | 0 refills | Status: DC | PRN

## 2021-02-26 NOTE — Telephone Encounter (Signed)
Oxycodone-Acetaminophen 5/325 mg  Qty 110 Tablets   PATIENT USES [facility]

## 2021-02-26 NOTE — Addendum Note (Signed)
Addended by: Earnstine Regal on: 02/26/2021 06:57 PM   Modules accepted: Orders

## 2021-03-08 ENCOUNTER — Encounter: Payer: Self-pay | Admitting: Orthopaedic Surgery

## 2021-03-08 ENCOUNTER — Ambulatory Visit (INDEPENDENT_AMBULATORY_CARE_PROVIDER_SITE_OTHER): Payer: No Typology Code available for payment source | Admitting: Orthopaedic Surgery

## 2021-03-08 ENCOUNTER — Other Ambulatory Visit: Payer: Self-pay

## 2021-03-08 VITALS — BP 198/94 | HR 80 | Ht 62.0 in | Wt 305.6 lb

## 2021-03-08 DIAGNOSIS — M170 Bilateral primary osteoarthritis of knee: Secondary | ICD-10-CM

## 2021-03-08 DIAGNOSIS — Z6841 Body Mass Index (BMI) 40.0 and over, adult: Secondary | ICD-10-CM | POA: Diagnosis not present

## 2021-03-08 DIAGNOSIS — M65341 Trigger finger, right ring finger: Secondary | ICD-10-CM

## 2021-03-08 MED ORDER — OXYCODONE-ACETAMINOPHEN 5-325 MG PO TABS: 1.0000 | ORAL_TABLET | ORAL | 0 refills | Status: DC | PRN

## 2021-03-08 NOTE — Patient Instructions (Signed)
Aspercreme, Biofreeze, Blue Emu or Voltaren Gel over the counter 2-3 times daily. Rub into area well each use for best results.  

## 2021-03-08 NOTE — Progress Notes (Signed)
I am a little sore.  She has had some more pain of the knees, more on the left, with the cold weather and rain. She has no new trauma.  She has swelling and popping. She uses a cane.  Her trigger finger is still bothering her right ring finger.  She wants to hold off on any surgery.  Both knees have ROM of 0 to 100 with more crepitus and swelling of the left knee. She has a limp to the left. She uses a cane.  NV intact.  Encounter Diagnoses  Name Primary?   Bilateral primary osteoarthritis of knee Yes   Trigger finger, right ring finger    Body mass index 50.0-59.9, adult (HCC)    Morbid obesity due to excess calories (HCC)    She did not get her medicine I thought I had called in.  I checked with the computer and it says it was sent in but the pharmacy did not get it.  I will do it again today.  I have reviewed the [place] Controlled Substance Reporting System web site prior to prescribing narcotic medicine for this patient.  Return in six weeks.  Call if any problem.  Precautions discussed.  Electronically Signed Darreld Mclean, MD 12/15/20229:24 AM

## 2021-03-15 ENCOUNTER — Ambulatory Visit (INDEPENDENT_AMBULATORY_CARE_PROVIDER_SITE_OTHER): Payer: No Typology Code available for payment source | Admitting: Bariatrics

## 2021-04-05 ENCOUNTER — Encounter (INDEPENDENT_AMBULATORY_CARE_PROVIDER_SITE_OTHER): Payer: Self-pay | Admitting: Bariatrics

## 2021-04-05 ENCOUNTER — Telehealth (INDEPENDENT_AMBULATORY_CARE_PROVIDER_SITE_OTHER): Payer: No Typology Code available for payment source | Admitting: Bariatrics

## 2021-04-05 DIAGNOSIS — I10 Essential (primary) hypertension: Secondary | ICD-10-CM

## 2021-04-05 DIAGNOSIS — E559 Vitamin D deficiency, unspecified: Secondary | ICD-10-CM | POA: Diagnosis not present

## 2021-04-05 DIAGNOSIS — Z6841 Body Mass Index (BMI) 40.0 and over, adult: Secondary | ICD-10-CM

## 2021-04-05 MED ORDER — VITAMIN D (ERGOCALCIFEROL) 1.25 MG (50000 UNIT) PO CAPS: 50000.0000 [IU] | ORAL_CAPSULE | ORAL | 0 refills | Status: DC

## 2021-04-06 ENCOUNTER — Telehealth: Payer: Self-pay | Admitting: Orthopaedic Surgery

## 2021-04-06 NOTE — Telephone Encounter (Signed)
Patient called to ask about her intermittent FMLA form she would like to have competed; discussed forms process; understands.

## 2021-04-09 NOTE — Progress Notes (Signed)
TeleHealth Visit:  Due to the COVID-19 pandemic, this visit was completed with telemedicine (audio/video) technology to reduce patient and provider exposure as well as to preserve personal protective equipment.   Whitney Bauer has verbally consented to this TeleHealth visit. The patient is located at home, the provider is located at the [facility] office. The participants in this visit include the listed provider and patient. The visit was conducted today via telephone.  Whitney Bauer was unable to use realtime audiovisual technology today and the telehealth visit was conducted via telephone.  Chief Complaint: OBESITY Whitney Bauer is here to discuss her progress with her obesity treatment plan along with follow-up of her obesity related diagnoses. Whitney Bauer is on the Pescatarian Plan and states she is following her eating plan approximately 0% of the time. Whitney Bauer states she is doing 0 minutes 0 times per week.  Today's visit was #: 20 Starting weight: 336 lbs Starting date: 04/08/2018  Interim History: Whitney Bauer has a stomach virus and needs a virtual visit. She has gone up about 3-5 lbs. She is doing okay with her water intake.  Subjective:   1. Essential hypertension Whitney Bauer's blood pressure is controlled. Her last blood pressure on 01-04-2021 was 149/85.  2. Vitamin D deficiency Whitney Bauer is currently taking Vitamin D.  Assessment/Plan:   1. Essential hypertension Whitney Bauer will continue taking her medications. She is working on healthy weight loss and exercise to improve blood pressure control. We will watch for signs of hypotension as she continues her lifestyle modifications.  2. Vitamin D deficiency Low Vitamin D level contributes to fatigue and are associated with obesity, breast, and colon cancer. We will refill prescription Vitamin D 50,000 IU every week and Whitney Bauer will follow-up for routine testing of Vitamin D, at least 2-3 times per year to avoid over-replacement.  - Vitamin D, Ergocalciferol,  (DRISDOL) 1.25 MG (50000 UNIT) CAPS capsule; Take 1 capsule (50,000 Units total) by mouth every 7 (seven) days.  Dispense: 4 capsule; Refill: 0  3. Obesity,  current BMI 54.7 Whitney Bauer is currently in the action stage of change. As such, her goal is to continue with weight loss efforts. She has agreed to the [employer].   Whitney Bauer will continue meal planning and she will continue intentional eating. She will have protein shakes.  Exercise goals:  Whitney Bauer will walk more.  Behavioral modification strategies: increasing lean protein intake, decreasing simple carbohydrates, increasing vegetables, increasing water intake, decreasing eating out, no skipping meals, meal planning and cooking strategies, keeping healthy foods in the home, and planning for success.  Whitney Bauer has agreed to follow-up with our clinic in 3-4 weeks. She was informed of the importance of frequent follow-up visits to maximize her success with intensive lifestyle modifications for her multiple health conditions.  Objective:   VITALS: Per patient if applicable, see vitals. GENERAL: Alert and in no acute distress. CARDIOPULMONARY: No increased WOB. Speaking in clear sentences.  PSYCH: Pleasant and cooperative. Speech normal rate and rhythm. Affect is appropriate. Insight and judgement are appropriate. Attention is focused, linear, and appropriate.  NEURO: Oriented as arrived to appointment on time with no prompting.   Lab Results  Component Value Date   CREATININE 1.11 (H) 06/01/2020   BUN 31 (H) 06/01/2020   NA 141 06/01/2020   K 4.1 06/01/2020   CL 99 06/01/2020   CO2 23 06/01/2020   Lab Results  Component Value Date   ALT 16 06/01/2020   AST 16 06/01/2020   ALKPHOS 72 06/01/2020  BILITOT 0.4 06/01/2020   Lab Results  Component Value Date   HGBA1C 5.6 02/02/2020   HGBA1C 5.4 04/08/2018   Lab Results  Component Value Date   INSULIN 5.3 02/02/2020   INSULIN 4.4 04/08/2018   Lab Results  Component Value Date   TSH  1.650 02/02/2020   Lab Results  Component Value Date   CHOL 262 (H) 06/01/2020   HDL 91 06/01/2020   LDLCALC 158 (H) 06/01/2020   TRIG 80 06/01/2020   Lab Results  Component Value Date   VD25OH 20.3 (L) 06/01/2020   VD25OH 9.5 (L) 02/02/2020   VD25OH 6.1 (L) 04/08/2018   Lab Results  Component Value Date   WBC 4.5 12/20/2019   HGB 12.2 12/20/2019   HCT 37.6 12/20/2019   MCV 90.4 12/20/2019   PLT 216 12/20/2019   No results found for: IRON, TIBC, FERRITIN  Attestation Statements:   Reviewed by clinician on day of visit: allergies, medications, problem list, medical history, surgical history, family history, social history, and previous encounter notes.  Time spent on visit including pre-visit chart review and post-visit charting and care was 93 minutes.   I, Jackson LatinoAlisa Rajeev Escue, RMA, am acting as Energy managertranscriptionist for [employer], DO.  I have reviewed the above documentation for accuracy and completeness, and I agree with the above. -

## 2021-04-12 ENCOUNTER — Encounter: Payer: Self-pay | Admitting: Orthopaedic Surgery

## 2021-04-12 ENCOUNTER — Ambulatory Visit: Payer: No Typology Code available for payment source | Admitting: Orthopaedic Surgery

## 2021-04-12 ENCOUNTER — Other Ambulatory Visit: Payer: Self-pay

## 2021-04-12 VITALS — Ht 62.0 in | Wt 300.0 lb

## 2021-04-12 DIAGNOSIS — Z6841 Body Mass Index (BMI) 40.0 and over, adult: Secondary | ICD-10-CM

## 2021-04-12 DIAGNOSIS — M65341 Trigger finger, right ring finger: Secondary | ICD-10-CM

## 2021-04-12 DIAGNOSIS — M170 Bilateral primary osteoarthritis of knee: Secondary | ICD-10-CM

## 2021-04-12 NOTE — Progress Notes (Signed)
My finger pops.  She has had signs of trigger finger of the right ring finger.  I will have Dr. Romeo Apple see her.  Her knees are tender all the time and pop and swell.  She has no new trauma.  She is taking her medicine.  ROM of both knees is 0 to 90 with left more painful.  Crepitus and effusion present.  NV intact.  I cannot get the right ring finger to trigger today.  It is tender over the A1 pulley.  Encounter Diagnoses  Name Primary?   Bilateral primary osteoarthritis of knee Yes   Trigger finger, right ring finger    Body mass index 50.0-59.9, adult (HCC)    Morbid obesity due to excess calories (HCC)    To see Dr. Romeo Apple for trigger finger.  Return in six weeks.  Call if any problem.  Precautions discussed.  Electronically Signed Darreld Mclean, MD 1/19/20238:31 AM

## 2021-04-12 NOTE — Patient Instructions (Addendum)
Return to clinic to see Dr. Romeo Apple for surgery discussion on trigger finger.

## 2021-04-24 ENCOUNTER — Telehealth: Payer: Self-pay | Admitting: Orthopaedic Surgery

## 2021-04-24 NOTE — Telephone Encounter (Signed)
Called patient, notified that her FMLA form is ready for her to pick up.

## 2021-04-25 ENCOUNTER — Other Ambulatory Visit: Payer: Self-pay

## 2021-04-25 ENCOUNTER — Encounter: Payer: Self-pay | Admitting: Orthopedic Surgery

## 2021-04-25 ENCOUNTER — Ambulatory Visit (INDEPENDENT_AMBULATORY_CARE_PROVIDER_SITE_OTHER): Payer: No Typology Code available for payment source | Admitting: Orthopedic Surgery

## 2021-04-25 VITALS — BP 158/94 | HR 74 | Ht 62.0 in | Wt 300.0 lb

## 2021-04-25 DIAGNOSIS — Z6841 Body Mass Index (BMI) 40.0 and over, adult: Secondary | ICD-10-CM | POA: Diagnosis not present

## 2021-04-25 DIAGNOSIS — M65341 Trigger finger, right ring finger: Secondary | ICD-10-CM | POA: Diagnosis not present

## 2021-04-25 DIAGNOSIS — M4316 Spondylolisthesis, lumbar region: Secondary | ICD-10-CM | POA: Insufficient documentation

## 2021-04-25 DIAGNOSIS — R29898 Other symptoms and signs involving the musculoskeletal system: Secondary | ICD-10-CM | POA: Insufficient documentation

## 2021-04-25 NOTE — Progress Notes (Signed)
Whitney Bauer  04/25/2021  Body mass index is 54.87 kg/m.  ASSESSMENT AND PLAN:     Encounter Diagnosis  Name Primary?   Trigger finger, right ring finger Yes    The procedure has been fully reviewed with the patient; The risks and benefits of surgery have been discussed and explained and understood. Alternative treatment has also been reviewed, questions were encouraged and answered. The postoperative plan is also been reviewed.  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  She has severe pain when the finger gets caught and she has to manually reduce it     HISTORY SECTION :   Review of Systems  Constitutional:  Negative for fever.  HENT:  Negative for hearing loss.   Respiratory:  Negative for cough.   Cardiovascular:  Negative for chest pain.  Skin:  Negative for rash.  Endo/Heme/Allergies:  Does not bruise/bleed easily.    has a past medical history of Anemia, Arthritis, Back pain, Back pain, Constipation, Edema of both lower extremities, Family history of adverse reaction to anesthesia, Food allergy, GERD (gastroesophageal reflux disease), Hypertension, Joint pain, Joint pain, Lactose intolerance, Rheumatoid arthritis ([place]), Swelling of both lower extremities, and Vitamin D deficiency.   Past Surgical History:  Procedure Laterality Date   BIOPSY  12/22/2019   Procedure: BIOPSY;  Surgeon: Rogene Houston, MD;  [facility];  Service: Endoscopy;;  antral   COLONOSCOPY N/A 01/06/2014   Procedure: COLONOSCOPY;  Surgeon: Rogene Houston, MD;  [facility];  Service: Endoscopy;  Laterality: N/A;  830   COLONOSCOPY WITH PROPOFOL N/A 12/22/2019   Procedure: COLONOSCOPY WITH  PROPOFOL;  Surgeon: Rogene Houston, MD;  [facility];  Service: Endoscopy;  Laterality: N/A;  930   ESOPHAGOGASTRODUODENOSCOPY (EGD) WITH PROPOFOL N/A 12/22/2019   Procedure: ESOPHAGOGASTRODUODENOSCOPY (EGD) WITH PROPOFOL;  Surgeon: Rogene Houston, MD;  [facility];  Service: Endoscopy;  Laterality: N/A;   FOOT SURGERY     PARTIAL HYSTERECTOMY  jan 1999    Social History   Socioeconomic History   Marital status: Single    Spouse name: Not on file   Number of children: Not on file   Years of education: Not on file   Highest education level: Not on file  Occupational History   Occupation: Warehouse manager clker    Employer: [employer]  Tobacco Use   Smoking status: Never   Smokeless tobacco: Never  Vaping Use   Vaping Use: Never used  Substance and Sexual Activity   Alcohol use: No   Drug use: Never   Sexual activity: Not on file  Other Topics Concern   Not on file  Social History Narrative   Lives alone.    Social Determinants of Health   Financial Resource Strain: Not on file  Food Insecurity: Not on file  Transportation Needs: Not on file  Physical Activity: Not on file  Stress: Not on file  Social Connections: Not on file  Intimate Partner Violence: Not on file     Family History  Problem Relation Age of Onset   Asthma Mother    Heart disease Mother    Pancreatic cancer  Mother    Ovarian cancer Mother    Diabetes Mother    Stroke Mother    High blood pressure Mother    Asthma Father    Colon cancer Father    Prostate cancer Father    Stroke Father    High blood pressure Father    Asthma Sister    Cancer Paternal Grandmother        mouth      Allergies  Allergen Reactions   Pantoprazole Other (See Comments)    Facial/mouth/jaw spasms (facial droop)     Current Outpatient Medications:    cyclobenzaprine (FLEXERIL) 10 MG tablet, TAKE 1 TABLET BY MOUTH DAILY AS NEEDED FOR MUSCLE SPASMS., Disp: 30 tablet, Rfl: 3    diclofenac (VOLTAREN) 75 MG EC tablet, TAKE 1 TABLET BY MOUTH 2 TIMES DAILY WITH A MEAL., Disp: 60 tablet, Rfl: 5   esomeprazole (NEXIUM) 40 MG capsule, Take 40 mg by mouth daily. , Disp: , Rfl:    loperamide (IMODIUM) 1 MG/5ML solution, Take 2-4 mg by mouth 4 (four) times daily as needed for diarrhea or loose stools. , Disp: , Rfl:    oxyCODONE-acetaminophen (PERCOCET/ROXICET) 5-325 MG tablet, Take 1 tablet by mouth every 4 (four) hours as needed for severe pain., Disp: 110 tablet, Rfl: 0   traMADol (ULTRAM) 50 MG tablet, Take 50 mg by mouth every 6 (six) hours as needed. , Disp: , Rfl:    Vitamin D, Ergocalciferol, (DRISDOL) 1.25 MG (50000 UNIT) CAPS capsule, Take 1 capsule (50,000 Units total) by mouth every 7 (seven) days., Disp: 4 capsule, Rfl: 0   PHYSICAL EXAM SECTION: Ht 5\' 2"  (1.575 m)    Wt 300 lb (136.1 kg)    BMI 54.87 kg/m   Body mass index is 54.87 kg/m.  The patient meets the AMA guidelines for Morbid (severe) obesity with a BMI > 40.0 and I have recommended weight loss.    General appearance: Well-developed well-nourished no gross deformities  Eyes clear normal vision no evidence of conjunctivitis or jaundice, extraocular muscles intact  ENT: ears hearing normal, nasal passages clear, throat clear   Neck is supple without palpable mass, full range of motion   Cardiovascular normal pulse and perfusion  normal color without edema  Lymph nodes: No lymphadenopathy  Neurologically deep tendon reflexes are equal and normal, no sensation loss or deficits no pathologic reflexes   Skin no lacerations or ulcerations no nodularity no palpable masses, no erythema or nodularity  Psychological: Awake alert and oriented x3 mood and affect normal  Musculoskeletal: Right ring finger tenderness over the A1 pulley with clicking and popping flexor tendons extensor tendons intact color capillary refill normal  Imaging: I personally read the images and my interpretation is no  imaging  10:28 AM  Arther Abbott

## 2021-04-26 ENCOUNTER — Encounter (INDEPENDENT_AMBULATORY_CARE_PROVIDER_SITE_OTHER): Payer: Self-pay | Admitting: Bariatrics

## 2021-04-26 ENCOUNTER — Ambulatory Visit (INDEPENDENT_AMBULATORY_CARE_PROVIDER_SITE_OTHER): Payer: No Typology Code available for payment source | Admitting: Bariatrics

## 2021-04-26 ENCOUNTER — Other Ambulatory Visit: Payer: Self-pay

## 2021-04-26 VITALS — BP 127/82 | HR 75 | Temp 98.0°F | Ht 65.0 in | Wt 302.0 lb

## 2021-04-26 DIAGNOSIS — E669 Obesity, unspecified: Secondary | ICD-10-CM

## 2021-04-26 DIAGNOSIS — Z6841 Body Mass Index (BMI) 40.0 and over, adult: Secondary | ICD-10-CM

## 2021-04-26 DIAGNOSIS — E559 Vitamin D deficiency, unspecified: Secondary | ICD-10-CM

## 2021-04-26 MED ORDER — VITAMIN D (ERGOCALCIFEROL) 1.25 MG (50000 UNIT) PO CAPS: 50000.0000 [IU] | ORAL_CAPSULE | ORAL | 0 refills | Status: DC

## 2021-04-30 ENCOUNTER — Encounter (INDEPENDENT_AMBULATORY_CARE_PROVIDER_SITE_OTHER): Payer: Self-pay | Admitting: Bariatrics

## 2021-04-30 NOTE — Progress Notes (Signed)
Chief Complaint:   OBESITY Prisca is here to discuss her progress with her obesity treatment plan along with follow-up of her obesity related diagnoses. Gaetana is on the Category 1 Plan and states she is following her eating plan approximately 60% of the time. Miryah states she is doing 0 minutes 0 times per week.  Today's visit was #: 21 Starting weight: 336 lbs Starting date: 04/08/2018 Today's weight: 302 lbs Today's date: 04/26/2021 Total lbs lost to date: 34 lbs Total lbs lost since last in-office visit: 0  Interim History: Myeesha is up 3 lbs since her last visit.  Subjective:   1. Vitamin D deficiency Saray is taking Vitamin D currently.  Assessment/Plan:   1. Vitamin D deficiency Low Vitamin D level contributes to fatigue and are associated with obesity, breast, and colon cancer. Mikaylee agrees to continue to take prescription Vitamin D 50,000 IU every week and she will follow-up for routine testing of Vitamin D, at least 2-3 times per year to avoid over-replacement.  - Vitamin D, Ergocalciferol, (DRISDOL) 1.25 MG (50000 UNIT) CAPS capsule; Take 1 capsule (50,000 Units total) by mouth every 7 (seven) days.  Dispense: 4 capsule; Refill: 0  2. Obesity,  current BMI 54.4 Twanda is currently in the action stage of change. As such, her goal is to continue with weight loss efforts. She has agreed to the Category 1 Plan.   Margurite will continue meal planning and she will continue intentional eating.  Exercise goals:  Curley will sit and be fit exercises.  Behavioral modification strategies: increasing lean protein intake, decreasing simple carbohydrates, increasing vegetables, increasing water intake, decreasing eating out, no skipping meals, meal planning and cooking strategies, keeping healthy foods in the home, and planning for success.  Angelisse has agreed to follow-up with our clinic in 3-4 weeks. She was informed of the importance of frequent follow-up visits to maximize her success with  intensive lifestyle modifications for her multiple health conditions.   Objective:   Blood pressure 127/82, pulse 75, temperature 98 F (36.7 C), height  (1.651 m), weight (!) 302 lb (137 kg), SpO2 100 %. Body mass index is 50.26 kg/m. Melanye is using a 3-point cane for ambulation.  General: Cooperative, alert, well developed, in no acute distress. HEENT: Conjunctivae and lids unremarkable. Cardiovascular: Regular rhythm.  Lungs: Normal work of breathing. Neurologic: No focal deficits.   Lab Results  Component Value Date   CREATININE 1.11 (H) 06/01/2020   BUN 31 (H) 06/01/2020   NA 141 06/01/2020   K 4.1 06/01/2020   CL 99 06/01/2020   CO2 23 06/01/2020   Lab Results  Component Value Date   ALT 16 06/01/2020   AST 16 06/01/2020   ALKPHOS 72 06/01/2020   BILITOT 0.4 06/01/2020   Lab Results  Component Value Date   HGBA1C 5.6 02/02/2020   HGBA1C 5.4 04/08/2018   Lab Results  Component Value Date   INSULIN 5.3 02/02/2020   INSULIN 4.4 04/08/2018   Lab Results  Component Value Date   TSH 1.650 02/02/2020   Lab Results  Component Value Date   CHOL 262 (H) 06/01/2020   HDL 91 06/01/2020   LDLCALC 158 (H) 06/01/2020   TRIG 80 06/01/2020   Lab Results  Component Value Date   VD25OH 20.3 (L) 06/01/2020   VD25OH 9.5 (L) 02/02/2020   VD25OH 6.1 (L) 04/08/2018   Lab Results  Component Value Date   WBC 4.5 12/20/2019   HGB 12.2 12/20/2019  HCT 37.6 12/20/2019   MCV 90.4 12/20/2019   PLT 216 12/20/2019   No results found for: IRON, TIBC, FERRITIN  Attestation Statements:   Reviewed by clinician on day of visit: allergies, medications, problem list, medical history, surgical history, family history, social history, and previous encounter notes.  I, Jackson Latino, RMA, am acting as Energy manager for [employer], DO.  I have reviewed the above documentation for accuracy and completeness, and I agree with the above. Corinna Capra, DO

## 2021-05-23 ENCOUNTER — Ambulatory Visit (INDEPENDENT_AMBULATORY_CARE_PROVIDER_SITE_OTHER): Payer: No Typology Code available for payment source | Admitting: Bariatrics

## 2021-05-23 ENCOUNTER — Other Ambulatory Visit: Payer: Self-pay

## 2021-05-23 ENCOUNTER — Encounter (INDEPENDENT_AMBULATORY_CARE_PROVIDER_SITE_OTHER): Payer: Self-pay | Admitting: Bariatrics

## 2021-05-23 VITALS — BP 128/84 | HR 76 | Temp 98.1°F | Ht 65.0 in | Wt 304.0 lb

## 2021-05-23 DIAGNOSIS — E669 Obesity, unspecified: Secondary | ICD-10-CM

## 2021-05-23 DIAGNOSIS — I10 Essential (primary) hypertension: Secondary | ICD-10-CM

## 2021-05-23 DIAGNOSIS — E559 Vitamin D deficiency, unspecified: Secondary | ICD-10-CM

## 2021-05-23 DIAGNOSIS — Z6841 Body Mass Index (BMI) 40.0 and over, adult: Secondary | ICD-10-CM | POA: Diagnosis not present

## 2021-05-23 NOTE — Progress Notes (Addendum)
? ? ? ?Chief Complaint:  ? ?OBESITY ?Whitney Bauer is here to discuss her progress with her obesity treatment plan along with follow-up of her obesity related diagnoses. Whitney Bauer is on the Category 1 Plan and states she is following her eating plan approximately 0% of the time. Whitney Bauer states she is not exercising at this time.  ? ?Today's visit was #: 22 ?Starting weight: 336 lbs ?Starting date: 04/08/2018 ?Today's weight: 304 lbs ?Today's date: 05/23/2021 ?Total lbs lost to date: 32 lbs ?Total lbs lost since last in-office visit: 0 ? ?Interim History: Whitney Bauer is doing well with her water and protein intake. She did not follow the plan.  ? ?Subjective:  ? ?1. Vitamin D deficiency ?Whitney Bauer is currently taking Vitamin D.  ? ?2. Essential hypertension ?Whitney Bauer's blood pressure is reasonably well controlled. ? ?Assessment/Plan:  ? ?1. Vitamin D deficiency ?Low Vitamin D level contributes to fatigue and are associated with obesity, breast, and colon cancer. Whitney Bauer will continue taking prescription Vitamin D and she will follow-up for routine testing of Vitamin D, at least 2-3 times per year to avoid over-replacement. ? ?2. Essential hypertension ?Whitney Bauer will continue taking her medications. She is working on healthy weight loss and exercise to improve blood pressure control. We will watch for signs of hypotension as she continues her lifestyle modifications. ? ?3. Obesity,  current BMI 50.7 ?Whitney Bauer is currently in the action stage of change. As such, her goal is to continue with weight loss efforts. She has agreed to alternate the Category 2 Plan and the Pescatarian Plan.  ? ?Exercise goals: No exercise has been prescribed at this time. ? ?Behavioral modification strategies: increasing lean protein intake, decreasing simple carbohydrates, increasing vegetables, increasing water intake, decreasing eating out, no skipping meals, meal planning and cooking strategies, keeping healthy foods in the home, and planning for success. ? ?Whitney Bauer has agreed to  follow-up with our clinic in 2 months. She was informed of the importance of frequent follow-up visits to maximize her success with intensive lifestyle modifications for her multiple health conditions.  ? ?Objective:  ? ?Blood pressure 128/84, pulse 76, temperature 98.1 ?F (36.7 ?C), height 5\' 5"  (1.651 m), weight (!) 304 lb (137.9 kg), SpO2 100 %. ?Body mass index is 50.59 kg/m?. ? ?General: Cooperative, alert, well developed, in no acute distress. ?HEENT: Conjunctivae and lids unremarkable. ?Cardiovascular: Regular rhythm.  ?Lungs: Normal work of breathing. ?Neurologic: No focal deficits.  ? ?Lab Results  ?Component Value Date  ? CREATININE 1.11 (H) 06/01/2020  ? BUN 31 (H) 06/01/2020  ? NA 141 06/01/2020  ? K 4.1 06/01/2020  ? CL 99 06/01/2020  ? CO2 23 06/01/2020  ? ?Lab Results  ?Component Value Date  ? ALT 16 06/01/2020  ? AST 16 06/01/2020  ? ALKPHOS 72 06/01/2020  ? BILITOT 0.4 06/01/2020  ? ?Lab Results  ?Component Value Date  ? HGBA1C 5.6 02/02/2020  ? HGBA1C 5.4 04/08/2018  ? ?Lab Results  ?Component Value Date  ? INSULIN 5.3 02/02/2020  ? INSULIN 4.4 04/08/2018  ? ?Lab Results  ?Component Value Date  ? TSH 1.650 02/02/2020  ? ?Lab Results  ?Component Value Date  ? CHOL 262 (H) 06/01/2020  ? HDL 91 06/01/2020  ? LDLCALC 158 (H) 06/01/2020  ? TRIG 80 06/01/2020  ? ?Lab Results  ?Component Value Date  ? VD25OH 20.3 (L) 06/01/2020  ? VD25OH 9.5 (L) 02/02/2020  ? VD25OH 6.1 (L) 04/08/2018  ? ?Lab Results  ?Component Value Date  ? WBC 4.5 12/20/2019  ?  HGB 12.2 12/20/2019  ? HCT 37.6 12/20/2019  ? MCV 90.4 12/20/2019  ? PLT 216 12/20/2019  ? ?No results found for: IRON, TIBC, FERRITIN ? ?Attestation Statements:  ? ?Reviewed by clinician on day of visit: allergies, medications, problem list, medical history, surgical history, family history, social history, and previous encounter notes. ? ?I, Jackson LatinoAlisa White, RMA, am acting as transcriptionist for [employer] Cody Oliger, DO. ? ?I have reviewed the above documentation for  accuracy and completeness, and I agree with the above. Corinna Capra-  Merdith Adan, DO ? ?

## 2021-05-24 ENCOUNTER — Ambulatory Visit: Payer: No Typology Code available for payment source | Admitting: Orthopaedic Surgery

## 2021-05-26 ENCOUNTER — Encounter (INDEPENDENT_AMBULATORY_CARE_PROVIDER_SITE_OTHER): Payer: Self-pay | Admitting: Bariatrics

## 2021-05-31 ENCOUNTER — Other Ambulatory Visit: Payer: Self-pay

## 2021-05-31 ENCOUNTER — Ambulatory Visit: Payer: No Typology Code available for payment source | Admitting: Orthopaedic Surgery

## 2021-05-31 ENCOUNTER — Encounter: Payer: Self-pay | Admitting: Orthopaedic Surgery

## 2021-05-31 VITALS — BP 171/94 | HR 73

## 2021-05-31 DIAGNOSIS — G8929 Other chronic pain: Secondary | ICD-10-CM

## 2021-05-31 DIAGNOSIS — Z6841 Body Mass Index (BMI) 40.0 and over, adult: Secondary | ICD-10-CM | POA: Diagnosis not present

## 2021-05-31 DIAGNOSIS — M170 Bilateral primary osteoarthritis of knee: Secondary | ICD-10-CM

## 2021-05-31 DIAGNOSIS — M25562 Pain in left knee: Secondary | ICD-10-CM | POA: Diagnosis not present

## 2021-05-31 NOTE — Progress Notes (Signed)
My left knee is more painful. ? ?She has chronic pain of both knees.  The left knee is hurting more.  She has swelling, popping and giving way.  She has no new trauma.  She is taking arthritis tylenol and it helps.  She is not a candidate for surgery. ? ?The trigger finger on the right ring has resolved for now. ? ?Both knees have effusion, ROM right is 0 to 105, left 0 to 100, limp to the the left, uses cane, crepitus both knees, NV intact. ? ?Encounter Diagnoses  ?Name Primary?  ? Chronic pain of left knee Yes  ? Bilateral primary osteoarthritis of knee   ? Body mass index 50.0-59.9, adult (HCC)   ? Morbid obesity ([facility])   ? ?Return in three months. ? ?Call if any problem. ? ?Precautions discussed. ? ?Electronically Signed ?Darreld McleanWayne Chrystina Naff, MD ?3/9/20239:15 AM ? ?

## 2021-06-07 ENCOUNTER — Ambulatory Visit (INDEPENDENT_AMBULATORY_CARE_PROVIDER_SITE_OTHER): Payer: No Typology Code available for payment source | Admitting: Physician Assistant

## 2021-06-07 ENCOUNTER — Other Ambulatory Visit: Payer: Self-pay

## 2021-06-07 VITALS — BP 146/93 | HR 85 | Ht 62.0 in | Wt 308.0 lb

## 2021-06-07 DIAGNOSIS — N3281 Overactive bladder: Secondary | ICD-10-CM | POA: Diagnosis not present

## 2021-06-07 DIAGNOSIS — R32 Unspecified urinary incontinence: Secondary | ICD-10-CM | POA: Diagnosis not present

## 2021-06-07 LAB — URINALYSIS, ROUTINE W REFLEX MICROSCOPIC
Bilirubin, UA: NEGATIVE
Glucose, UA: NEGATIVE
Leukocytes,UA: NEGATIVE
Nitrite, UA: NEGATIVE
RBC, UA: NEGATIVE
Specific Gravity, UA: >1.03 — ABNORMAL HIGH (ref 1.005–1.030)
Urobilinogen, Ur: 0.2 mg/dL (ref 0.2–1.0)
pH, UA: 5.5 (ref 5.0–7.5)

## 2021-06-07 LAB — MICROSCOPIC EXAMINATION
RBC, Urine: NONE SEEN /hpf (ref 0–2)
Renal Epithel, UA: NONE SEEN /hpf

## 2021-06-07 LAB — BLADDER SCAN AMB NON-IMAGING: Scan Result: 0

## 2021-06-07 MED ORDER — TROSPIUM CHLORIDE 20 MG PO TABS: 20.0000 mg | ORAL_TABLET | Freq: Two times a day (BID) | ORAL | 5 refills | Status: DC

## 2021-06-07 NOTE — Progress Notes (Signed)
? ?06/07/2021 ?9:28 AM  ? ?Whitney Bauer ?06-Jan-1956 ?[id number] ? ? ?Assessment: ?1. Urinary incontinence, unspecified type   ?2. OAB (overactive bladder)   ? ?Plan: ?[facility] Rx sent to the patient's pharmacy and side effects discussed at length.  She is given verbal and written information on incontinence and overactive bladder symptoms. ?- Urinalysis, Routine w reflex microscopic ? ?Chief Complaint: Urinary frequency ? ?Referring provider: Carylon Perches, MD ?[address] ?[place],  [place] [address] ? ? ?History of Present Illness: ? ?Whitney Bauer is a 66 y.o. year old female with history of hypertension, morbid obesity, osteoarthritis who is seen in consultation from Carylon Perches, MD  for evaluation of urge urinary incontinence, urgency, and frequency for the past few months. No UTIs.  No dysuria, burning, gross hematuria.  Patient reports that she has difficulty with leakage control after sitting for several hours. ?GYN history significant for partial hysterectomy in the past. ?Constipation-chronic, but well controlled with miralax ?Caffeine intake- 1-2/day ?Soda intake- none ?Number of pads used during day- 3 ?Number of pads used at night- 2-3 with a mattress protector as well ? ?Medical records including office notes, labs reviewed during patient's office visit ?PVR = 0 mL ?UA = clear ? ?Past Medical History:  ?Past Medical History:  ?Diagnosis Date  ? Anemia   ? Arthritis   ? bilat. knees  ? Back pain   ? Back pain   ? Constipation   ? Edema of both lower extremities   ? Family history of adverse reaction to anesthesia   ? patients mother had cardiac issues during anesthesia  ? Food allergy   ? GERD (gastroesophageal reflux disease)   ? Hypertension   ? Joint pain   ? Joint pain   ? Lactose intolerance   ? Rheumatoid arthritis ([facility]CC)   ? Swelling of both lower extremities   ? Vitamin D deficiency   ? ? ?Past Surgical History:  ?Past Surgical History:  ?Procedure Laterality Date  ? BIOPSY  12/22/2019  ?  Procedure: BIOPSY;  Surgeon: Malissa Hippo, MD;  [facility];  Service: Endoscopy;;  antral  ? COLONOSCOPY N/A 01/06/2014  ? Procedure: COLONOSCOPY;  Surgeon: Malissa Hippo, MD;  [facility];  Service: Endoscopy;  Laterality: N/A;  830  ? COLONOSCOPY WITH PROPOFOL N/A 12/22/2019  ? Procedure: COLONOSCOPY WITH PROPOFOL;  Surgeon: Malissa Hippo, MD;  [facility];  Service: Endoscopy;  Laterality: N/A;  930  ? ESOPHAGOGASTRODUODENOSCOPY (EGD) WITH PROPOFOL N/A 12/22/2019  ? Procedure: ESOPHAGOGASTRODUODENOSCOPY (EGD) WITH PROPOFOL;  Surgeon: Malissa Hippo, MD;  [facility];  Service: Endoscopy;  Laterality: N/A;  ? FOOT SURGERY    ? PARTIAL HYSTERECTOMY  jan 1999  ? ? ?Allergies:  ?Allergies  ?Allergen Reactions  ? Pantoprazole Other (See Comments)  ?  Facial/mouth/jaw spasms (facial droop)  ? ? ?Family History:  ?Family History  ?Problem Relation Age of Onset  ? Asthma Mother   ? Heart disease Mother   ? Pancreatic cancer Mother   ? Ovarian cancer Mother   ? Diabetes Mother   ? Stroke Mother   ? High blood pressure Mother   ? Asthma Father   ? Colon cancer Father   ? Prostate cancer Father   ? Stroke Father   ? High blood pressure Father   ? Asthma Sister   ? Cancer Paternal Grandmother   ?     mouth  ? ? ?Social History:  ?  Social History  ? ?Tobacco Use  ? Smoking status: Never  ? Smokeless tobacco: Never  ?Vaping Use  ? Vaping Use: Never used  ?Substance Use Topics  ? Alcohol use: No  ? Drug use: Never  ? ? ?Review of symptoms:  ?Constitutional:  Negative for unexplained weight loss, night sweats, fever, chills ?ENT:  Negative for nose bleeds, sinus pain, painful swallowing ?CV:  Negative for chest pain ?Resp:  Negative for cough ?GI:  Negative for nausea, vomiting, diarrhea, bloody stools ?GU:  Positives noted in HPI; otherwise negative for gross hematuria, dysuria ?Neuro:  Negative for seizures, poor balance, limb weakness, slurred speech ?Psych:  Negative  for lack of energy, depression, anxiety ?Endocrine:  Negative for polydipsia, polyuria, symptoms of hypoglycemia (dizziness, hunger, sweating) ?Hematologic:  Negative for anemia, purpura, petechia, prolonged or excessive bleeding, use of anticoagulants  ?Allergic:  Negative for difficulty breathing or choking as a result of exposure to anything; no shellfish allergy; no allergic response (rash/itch) to materials, foods ? ? ?Physical Exam: ?BP (!) 146/93   Pulse 85   Ht 5\' 2"  (1.575 m)   Wt (!) 308 lb (139.7 kg)   BMI 56.33 kg/m?   ?Constitutional:  Alert and oriented, No acute distress. ?HEENT: NCAT, moist mucus membranes.  Trachea midline, no masses. ?Cardiovascular: Regular rate and rhythm without murmur, rub, or gallops ?No clubbing, cyanosis, or edema. ?Respiratory: Normal respiratory effort, clear to auscultation bilaterally ?GI: Abdomen is soft, nontender, nondistended, no abdominal masses ?BACK:  Non-tender to palpation.  No CVAT ?Lymph: No cervical or inguinal lymphadenopathy. ?Skin: No obvious rashes, warm, dry, intact ?Neurologic: Alert and oriented, Cranial nerves grossly intact, no focal deficits, moving all 4 extremities. ?Psychiatric: Appropriate. Normal mood and affect. ? ?Laboratory Data: ?No results found for this or any previous visit (from the past 24 hour(s)). ? ?Lab Results  ?Component Value Date  ? WBC 4.5 12/20/2019  ? HGB 12.2 12/20/2019  ? HCT 37.6 12/20/2019  ? MCV 90.4 12/20/2019  ? PLT 216 12/20/2019  ? ? ?Lab Results  ?Component Value Date  ? CREATININE 1.11 (H) 06/01/2020  ? ? ?Lab Results  ?Component Value Date  ? HGBA1C 5.6 02/02/2020  ? ? ?Urinalysis ?   ?Component Value Date/Time  ? COLORURINE YELLOW 04/15/2011 1441  ? APPEARANCEUR CLEAR 04/15/2011 1441  ? LABSPEC >1.030 (H) 04/15/2011 1441  ? PHURINE 5.5 04/15/2011 1441  ? GLUCOSEU NEGATIVE 04/15/2011 1441  ? HGBUR NEGATIVE 04/15/2011 1441  ? BILIRUBINUR SMALL (A) 04/15/2011 1441  ? KETONESUR TRACE (A) 04/15/2011 1441  ?  PROTEINUR NEGATIVE 04/15/2011 1441  ? UROBILINOGEN 0.2 04/15/2011 1441  ? NITRITE NEGATIVE 04/15/2011 1441  ? LEUKOCYTESUR NEGATIVE 04/15/2011 1441  ? ? ?No results found for: LABMICR, WBCUA, RBCUA, LABEPIT, MUCUS, BACTERIA ? ?Pertinent Imaging: ? ?No results found for this or any previous visit. ? ?No results found for this or any previous visit. ? ?[place], Regan RakersJulienne Annette, PA-C ?[facility] [place] ?  ?

## 2021-06-07 NOTE — Progress Notes (Signed)
post void residual =0mL 

## 2021-06-28 ENCOUNTER — Encounter: Payer: Self-pay | Admitting: Orthopaedic Surgery

## 2021-06-28 ENCOUNTER — Ambulatory Visit: Payer: No Typology Code available for payment source | Admitting: Orthopaedic Surgery

## 2021-06-28 ENCOUNTER — Ambulatory Visit: Payer: No Typology Code available for payment source

## 2021-06-28 DIAGNOSIS — M25561 Pain in right knee: Secondary | ICD-10-CM

## 2021-06-28 DIAGNOSIS — M79671 Pain in right foot: Secondary | ICD-10-CM

## 2021-06-28 DIAGNOSIS — Z6841 Body Mass Index (BMI) 40.0 and over, adult: Secondary | ICD-10-CM

## 2021-06-28 DIAGNOSIS — G8929 Other chronic pain: Secondary | ICD-10-CM

## 2021-06-28 NOTE — Progress Notes (Signed)
My knee is hurting more and I have pain in my right foot. ? ?She has chronic end stage DJD of the right knee.  She has no new trauma.  She uses a cane.  She has significant limp and crepitus of the right knee. ? ?Her right foot dorsally near the second metatarsal has been hurting recently with weight bearing.  She has no pain at rest. She has noticed a small "knot" at times.  She has no redness.  She has some swelling but that is chronic to both feet. ? ?X-rays were done of the right foot, reported separately. ? ?Right knee has ROM 0 to 100, crepitus, effusion, stable, limp to the right. ? ?Right foot has pain dorsally about the second metatarsal distally and there is a very small grape seed size area nodule that is not tender in this area.  There is no redness.  There is very slight edema of the foot.  Sensation is good, pulses are good.  ROM ankle and toes are full. ? ?Encounter Diagnoses  ?Name Primary?  ? Pain in right foot Yes  ? Chronic pain of right knee   ? Body mass index 50.0-59.9, adult (HCC)   ? Morbid obesity (HCC)   ? ?PROCEDURE NOTE: ? ?The patient requests injections of the right knee , verbal consent was obtained. ? ?The right knee was prepped appropriately after time out was performed.  ? ?Sterile technique was observed and injection of 1 cc of DepoMedrol 40mg  with several cc's of plain xylocaine. Anesthesia was provided by ethyl chloride and a 20-gauge needle was used to inject the knee area. The injection was tolerated well.  A band aid dressing was applied. ? ?The patient was advised to apply ice later today and tomorrow to the injection sight as needed. ? ?As to the foot, I recommend Biofreeze, Aspercreme or voltaren gel and observe. ? ?I will see as needed for this.  If the foot gets more painful, a scan could show a possible occult fracture. ? ?Call if any problem. ? ?Precautions discussed. ? ?Electronically Signed ?Darreld Mclean, MD ?4/6/202310:17 AM ? ?

## 2021-07-03 ENCOUNTER — Telehealth: Payer: Self-pay | Admitting: Orthopaedic Surgery

## 2021-07-17 ENCOUNTER — Telehealth: Payer: Self-pay

## 2021-07-17 NOTE — Telephone Encounter (Signed)
Patient called stating she is not seeing results after taking the trospium for a month now.  She wants to know if she is to continue taking the rx or if she needs to schedule and apt.  Please advise.   ?

## 2021-07-19 NOTE — Telephone Encounter (Signed)
Patient aware and voiced understanding

## 2021-07-25 ENCOUNTER — Ambulatory Visit (INDEPENDENT_AMBULATORY_CARE_PROVIDER_SITE_OTHER): Payer: No Typology Code available for payment source | Admitting: Bariatrics

## 2021-07-25 VITALS — BP 123/80 | HR 80 | Temp 98.1°F | Ht 65.0 in | Wt 297.0 lb

## 2021-07-25 DIAGNOSIS — Z6841 Body Mass Index (BMI) 40.0 and over, adult: Secondary | ICD-10-CM

## 2021-07-25 DIAGNOSIS — E669 Obesity, unspecified: Secondary | ICD-10-CM | POA: Diagnosis not present

## 2021-07-25 DIAGNOSIS — E559 Vitamin D deficiency, unspecified: Secondary | ICD-10-CM

## 2021-07-25 DIAGNOSIS — I10 Essential (primary) hypertension: Secondary | ICD-10-CM

## 2021-07-25 MED ORDER — VITAMIN D (ERGOCALCIFEROL) 1.25 MG (50000 UNIT) PO CAPS: 50000.0000 [IU] | ORAL_CAPSULE | ORAL | 0 refills | Status: DC

## 2021-08-01 NOTE — Progress Notes (Signed)
Chief Complaint:   OBESITY Whitney Bauer is here to discuss her progress with her obesity treatment plan along with follow-up of her obesity related diagnoses. Whitney Bauer is on the Category 2 Plan and the Pescatarian Plan and states she is following her eating plan approximately 0% of the time. Whitney Bauer states she is doing 0 minutes 0 times per week.  Today's visit was #: 23 Starting weight: 336 lbs Starting date: 04/08/2018 Today's weight: 297 lbs Today's date: 07/25/2021 Total lbs lost to date: 39 lbs Total lbs lost since last in-office visit: 7 lbs  Interim History: Whitney Bauer is down another 7 lbs since her last visit. She is still getting her water. She is making better choices.   Subjective:   1. Vitamin D deficiency Whitney Bauer is currently taking Vitamin D.   2. Essential hypertension Whitney Bauer's blood pressure is controlled. Her blood pressure today was 123/80.  Assessment/Plan:   1. Vitamin D deficiency Low Vitamin D level contributes to fatigue and are associated with obesity, breast, and colon cancer. We will refill prescription Vitamin D 50,000 IU every week for 1 month with no refills and Whitney Bauer will follow-up for routine testing of Vitamin D, at least 2-3 times per year to avoid over-replacement.  - Vitamin D, Ergocalciferol, (DRISDOL) 1.25 MG (50000 UNIT) CAPS capsule; Take 1 capsule (50,000 Units total) by mouth every 7 (seven) days.  Dispense: 6 capsule; Refill: 0  2. Essential hypertension Whitney Bauer will continue her medications. She is working on healthy weight loss and exercise to improve blood pressure control. We will watch for signs of hypotension as she continues her lifestyle modifications.  3. Obesity, Current BMI 49.5 Whitney Bauer is currently in the action stage of change. As such, her goal is to continue with weight loss efforts. She has agreed to keeping a food journal and adhering to recommended goals of 1200 calories and 80 grams of protein and the Pescatarian Plan.   Whitney Bauer will continue meal  planning and she will adhere to the Pescatarian plan or calories and plan.   Exercise goals: No exercise has been prescribed at this time.  Behavioral modification strategies: increasing lean protein intake, decreasing simple carbohydrates, increasing vegetables, increasing water intake, decreasing eating out, no skipping meals, meal planning and cooking strategies, keeping healthy foods in the home, and planning for success.  Whitney Bauer has agreed to follow-up with our clinic in 6 weeks. She was informed of the importance of frequent follow-up visits to maximize her success with intensive lifestyle modifications for her multiple health conditions.   Objective:   Blood pressure 123/80, pulse 80, temperature 98.1 F (36.7 C), height 5\' 5"  (1.651 m), weight 297 lb (134.7 kg), SpO2 97 %. Body mass index is 49.42 kg/m. Whitney Bauer is using a cane for ambulation.   General: Cooperative, alert, well developed, in no acute distress. HEENT: Conjunctivae and lids unremarkable. Cardiovascular: Regular rhythm.  Lungs: Normal work of breathing. Neurologic: No focal deficits.   Lab Results  Component Value Date   CREATININE 1.11 (H) 06/01/2020   BUN 31 (H) 06/01/2020   NA 141 06/01/2020   K 4.1 06/01/2020   CL 99 06/01/2020   CO2 23 06/01/2020   Lab Results  Component Value Date   ALT 16 06/01/2020   AST 16 06/01/2020   ALKPHOS 72 06/01/2020   BILITOT 0.4 06/01/2020   Lab Results  Component Value Date   HGBA1C 5.6 02/02/2020   HGBA1C 5.4 04/08/2018   Lab Results  Component Value Date  INSULIN 5.3 02/02/2020   INSULIN 4.4 04/08/2018   Lab Results  Component Value Date   TSH 1.650 02/02/2020   Lab Results  Component Value Date   CHOL 262 (H) 06/01/2020   HDL 91 06/01/2020   LDLCALC 158 (H) 06/01/2020   TRIG 80 06/01/2020   Lab Results  Component Value Date   VD25OH 20.3 (L) 06/01/2020   VD25OH 9.5 (L) 02/02/2020   VD25OH 6.1 (L) 04/08/2018   Lab Results  Component Value Date    WBC 4.5 12/20/2019   HGB 12.2 12/20/2019   HCT 37.6 12/20/2019   MCV 90.4 12/20/2019   PLT 216 12/20/2019   No results found for: IRON, TIBC, FERRITIN  Attestation Statements:   Reviewed by clinician on day of visit: allergies, medications, problem list, medical history, surgical history, family history, social history, and previous encounter notes.  I, Whitney Bauer, RMA, am acting as Energy managertranscriptionist for [employer] Qianna Clagett, DO.  I have reviewed the above documentation for accuracy and completeness, and I agree with the above. Corinna Capra-  Adaia Matthies, DO

## 2021-08-15 ENCOUNTER — Encounter (INDEPENDENT_AMBULATORY_CARE_PROVIDER_SITE_OTHER): Payer: Self-pay | Admitting: Bariatrics

## 2021-08-30 ENCOUNTER — Other Ambulatory Visit: Payer: Self-pay | Admitting: Physician Assistant

## 2021-08-30 ENCOUNTER — Ambulatory Visit (INDEPENDENT_AMBULATORY_CARE_PROVIDER_SITE_OTHER): Payer: No Typology Code available for payment source | Admitting: Physician Assistant

## 2021-08-30 VITALS — BP 157/86 | HR 82 | Ht 62.0 in | Wt 305.0 lb

## 2021-08-30 DIAGNOSIS — N3941 Urge incontinence: Secondary | ICD-10-CM

## 2021-08-30 DIAGNOSIS — R350 Frequency of micturition: Secondary | ICD-10-CM

## 2021-08-30 DIAGNOSIS — N3281 Overactive bladder: Secondary | ICD-10-CM

## 2021-08-30 DIAGNOSIS — R32 Unspecified urinary incontinence: Secondary | ICD-10-CM

## 2021-08-30 LAB — URINALYSIS, ROUTINE W REFLEX MICROSCOPIC
Bilirubin, UA: NEGATIVE
Glucose, UA: NEGATIVE
Ketones, UA: NEGATIVE
Leukocytes,UA: NEGATIVE
Nitrite, UA: NEGATIVE
RBC, UA: NEGATIVE
Specific Gravity, UA: 1.02 (ref 1.005–1.030)
Urobilinogen, Ur: 0.2 mg/dL (ref 0.2–1.0)
pH, UA: 5.5 (ref 5.0–7.5)

## 2021-08-30 LAB — BLADDER SCAN AMB NON-IMAGING: Scan Result: 0

## 2021-08-30 MED ORDER — GEMTESA 75 MG PO TABS: 75.0000 mg | ORAL_TABLET | Freq: Every day | ORAL | 0 refills | Status: DC

## 2021-08-30 NOTE — Progress Notes (Signed)
post void residual =0mL 

## 2021-08-30 NOTE — Progress Notes (Signed)
Assessment: 1. OAB (overactive bladder) - BLADDER SCAN AMB NON-IMAGING - Urinalysis, Routine w reflex microscopic  2. Urge incontinence of urine  3. Urinary frequency    Plan: Patient will discontinue Sanctura's and samples of Gemtesa 75mg  given as patient is reluctant to try Myrbetriq because of labile hypertension.  She will follow-up in 4 to 6 weeks for possible cystoscopic exam if symptoms not improving.  Also discussed potential need for urodynamics.  Chief Complaint: No chief complaint on file.   HPI: Whitney Bauer is a 66 y.o. female who presents for continued evaluation of OAB and urinary urge incontinence.  Since starting the Sanctura a few months ago, the patient has seen no change in her symptoms.  She continues to go through at least 4 large heavy-duty pads per day and continues to have frequency and nocturia with 3-4 voids per night.  The pad she wears during the night are always soaked when she voids.  She denies incomplete emptying, burning, dysuria, pain.  Minimal stress incontinence.  No gross hematuria.  No new issues with constipation.  Minimal caffeine intake as previously discussed.  Medical history also noted to be significant for L4-5 spondylolisthesis with spinal nerve ablation in the past. PVR = 0 mL UA = clear  06/07/21 Whitney Bauer is a 66 y.o. year old female with history of hypertension, morbid obesity, osteoarthritis who is seen in consultation from Carylon Perches, MD  for evaluation of urge urinary incontinence, urgency, and frequency for the past few months. No UTIs.  No dysuria, burning, gross hematuria.  Patient reports that she has difficulty with leakage control after sitting for several hours. GYN history significant for partial hysterectomy in the past. Constipation-chronic, but well controlled with miralax Caffeine intake- 1-2/day Soda intake- none Number of pads used during day- 3 Number of pads used at night- 2-3 with a mattress protector as  well   Medical records including office notes, labs reviewed during patient's office visit PVR = 0 mL UA = clear Portions of the above documentation were copied from a prior visit for review purposes only.  Allergies: Allergies  Allergen Reactions   Pantoprazole Other (See Comments)    Facial/mouth/jaw spasms (facial droop)    PMH: Past Medical History:  Diagnosis Date   Anemia    Arthritis    bilat. knees   Back pain    Back pain    Constipation    Edema of both lower extremities    Family history of adverse reaction to anesthesia    patients mother had cardiac issues during anesthesia   Food allergy    GERD (gastroesophageal reflux disease)    Hypertension    Joint pain    Joint pain    Lactose intolerance    Rheumatoid arthritis (HCC)    Swelling of both lower extremities    Vitamin D deficiency     PSH: Past Surgical History:  Procedure Laterality Date   BIOPSY  12/22/2019   Procedure: BIOPSY;  Surgeon: Malissa Hippo, MD;  [facility];  Service: Endoscopy;;  antral   COLONOSCOPY N/A 01/06/2014   Procedure: COLONOSCOPY;  Surgeon: Malissa Hippo, MD;  [facility];  Service: Endoscopy;  Laterality: N/A;  830   COLONOSCOPY WITH PROPOFOL N/A 12/22/2019   Procedure: COLONOSCOPY WITH PROPOFOL;  Surgeon: Malissa Hippo, MD;  [facility];  Service: Endoscopy;  Laterality: N/A;  930   ESOPHAGOGASTRODUODENOSCOPY (EGD) WITH PROPOFOL N/A 12/22/2019  Procedure: ESOPHAGOGASTRODUODENOSCOPY (EGD) WITH PROPOFOL;  Surgeon: Malissa Hippoehman, Najeeb U, MD;  [facility];  Service: Endoscopy;  Laterality: N/A;   FOOT SURGERY     PARTIAL HYSTERECTOMY  jan 1999    SH: Social History   Tobacco Use   Smoking status: Never   Smokeless tobacco: Never  Vaping Use   Vaping Use: Never used  Substance Use Topics   Alcohol use: No   Drug use: Never    ROS: See HPI  PE: BP (!) 157/86   Pulse 82   Ht 5\' 2"  (1.575 m)   Wt (!) 305 lb  (138.3 kg)   BMI 55.79 kg/m  GENERAL APPEARANCE:  Well appearing, well developed, well nourished, obese, NAD HEENT:  Atraumatic, normocephalic NECK:  Supple. Trachea midline ABDOMEN:  Soft, non-tender, no masses EXTREMITIES:  Moves all extremities well, but ambulates with antalgia and gait assit with cane NEUROLOGIC:  Alert and oriented x 3 MENTAL STATUS:  appropriate BACK:  Non-tender to palpation, No CVAT SKIN:  Warm, dry, and intact   Results: Laboratory Data: Lab Results  Component Value Date   WBC 4.5 12/20/2019   HGB 12.2 12/20/2019   HCT 37.6 12/20/2019   MCV 90.4 12/20/2019   PLT 216 12/20/2019    Lab Results  Component Value Date   CREATININE 1.11 (H) 06/01/2020    Lab Results  Component Value Date   HGBA1C 5.6 02/02/2020    Urinalysis    Component Value Date/Time   COLORURINE YELLOW 04/15/2011 1441   APPEARANCEUR Clear 06/07/2021 1019   LABSPEC >1.030 (H) 04/15/2011 1441   PHURINE 5.5 04/15/2011 1441   GLUCOSEU Negative 06/07/2021 1019   HGBUR NEGATIVE 04/15/2011 1441   BILIRUBINUR Negative 06/07/2021 1019   KETONESUR TRACE (A) 04/15/2011 1441   PROTEINUR 1+ (A) 06/07/2021 1019   PROTEINUR NEGATIVE 04/15/2011 1441   UROBILINOGEN 0.2 04/15/2011 1441   NITRITE Negative 06/07/2021 1019   NITRITE NEGATIVE 04/15/2011 1441   LEUKOCYTESUR Negative 06/07/2021 1019    Lab Results  Component Value Date   LABMICR See below: 06/07/2021   WBCUA 0-5 06/07/2021   LABEPIT 0-10 06/07/2021   MUCUS Present 06/07/2021   BACTERIA Few 06/07/2021    Pertinent Imaging:  No results found for this or any previous visit.  No results found for this or any previous visit.  No results found for this or any previous visit.  No results found for this or any previous visit.  No results found for this or any previous visit.  No results found for this or any previous visit.  No results found for this or any previous visit.  No results found for this or any  previous visit.  Results for orders placed or performed in visit on 08/30/21 (from the past 24 hour(s))  BLADDER SCAN AMB NON-IMAGING   Collection Time: 08/30/21 10:04 AM  Result Value Ref Range   Scan Result 0

## 2021-08-31 ENCOUNTER — Ambulatory Visit
Admission: EM | Admit: 2021-08-31 | Discharge: 2021-08-31 | Disposition: A | Discharge status: Home / Self Care | Payer: No Typology Code available for payment source

## 2021-08-31 DIAGNOSIS — I10 Essential (primary) hypertension: Secondary | ICD-10-CM | POA: Diagnosis not present

## 2021-08-31 NOTE — Discharge Instructions (Addendum)
-   Please continue the amlodipine for your blood pressure; if BP remains elevated you can increase to amlodipine 5 mg daily  - Follow up with your primary care provider in the next 2-4 weeks to recheck your blood pressure

## 2021-08-31 NOTE — ED Provider Notes (Signed)
[facility]    CSN: [id number] Arrival date & time: 08/31/21  1714      History   Chief Complaint Chief Complaint  Patient presents with   Hypertension    Blood pressure is up. Not sure if at a danger level. Need to be checked by professional. - Entered by patient    HPI Whitney Bauer is a 66 y.o. female.   Patient presents for elevated blood pressure.  Reports she is very nervous and anxious today because her blood pressure has been high at home.  She reports a couple of months ago, she stopped taking amlodipine because it gave her diarrhea.  Reports her primary care provider would not change her medicine to something else, so she stopped it.  Yesterday, she started rechecking her blood pressure and noticed it was elevated and became very anxious and started back on her blood pressure medicine on her own.  She denies any vision changes, blurred vision, double vision, chest pain, dizziness, shortness of breath, swelling of her legs today.  Reports she does take diclofenac on a daily basis for her knees.  She is currently working on trying to lose weight so she can have surgery on her knees.    Past Medical History:  Diagnosis Date   Anemia    Arthritis    bilat. knees   Back pain    Back pain    Constipation    Edema of both lower extremities    Family history of adverse reaction to anesthesia    patients mother had cardiac issues during anesthesia   Food allergy    GERD (gastroesophageal reflux disease)    Hypertension    Joint pain    Joint pain    Lactose intolerance    Rheumatoid arthritis (HCC)    Swelling of both lower extremities    Vitamin D deficiency     Patient Active Problem List   Diagnosis Date Noted   Spondylolisthesis at L3-L4 level 04/25/2021   Weakness of both arms 04/25/2021   Vitamin D deficiency 05/06/2018   Gastroesophageal reflux disease 04/13/2018   Essential hypertension 04/13/2018   Class 3 severe obesity with serious  comorbidity and body mass index (BMI) of 50.0 to 59.9 in adult ([place]) 04/13/2018   Derangement of posterior horn of medial meniscus of right knee 09/10/2016   Chronic pain of right knee 09/10/2016   DOE (dyspnea on exertion) 01/28/2011   Muscle weakness (generalized) 10/23/2010    Past Surgical History:  Procedure Laterality Date   BIOPSY  12/22/2019   Procedure: BIOPSY;  Surgeon: Rogene Houston, MD;  [facility];  Service: Endoscopy;;  antral   COLONOSCOPY N/A 01/06/2014   Procedure: COLONOSCOPY;  Surgeon: Rogene Houston, MD;  [facility];  Service: Endoscopy;  Laterality: N/A;  830   COLONOSCOPY WITH PROPOFOL N/A 12/22/2019   Procedure: COLONOSCOPY WITH PROPOFOL;  Surgeon: Rogene Houston, MD;  [facility];  Service: Endoscopy;  Laterality: N/A;  930   ESOPHAGOGASTRODUODENOSCOPY (EGD) WITH PROPOFOL N/A 12/22/2019   Procedure: ESOPHAGOGASTRODUODENOSCOPY (EGD) WITH PROPOFOL;  Surgeon: Rogene Houston, MD;  [facility];  Service: Endoscopy;  Laterality: N/A;   FOOT SURGERY     PARTIAL HYSTERECTOMY  jan 1999    OB History     Gravida  0   Para  0   Term  0   Preterm  0   AB  0   Living  0      SAB  0   IAB  0   Ectopic  0   Multiple  0   Live Births  0            Home Medications    Prior to Admission medications   Medication Sig Start Date End Date Taking? Authorizing Provider  amLODipine (NORVASC) 2.5 MG tablet Take 2.5 mg by mouth daily. 05/30/21   [provider]  cyclobenzaprine (FLEXERIL) 10 MG tablet TAKE 1 TABLET BY MOUTH DAILY AS NEEDED FOR MUSCLE SPASMS. 09/04/20   Sanjuana Kava, MD  diclofenac (VOLTAREN) 75 MG EC tablet TAKE 1 TABLET BY MOUTH 2 TIMES DAILY WITH A MEAL. 07/03/21   Sanjuana Kava, MD  esomeprazole (NEXIUM) 40 MG capsule Take 40 mg by mouth daily.  11/22/19   [provider]  loperamide (IMODIUM) 1 MG/5ML solution Take 2-4 mg by mouth 4 (four) times daily as needed for  diarrhea or loose stools.     [provider]  oxyCODONE-acetaminophen (PERCOCET/ROXICET) 5-325 MG tablet Take 1 tablet by mouth every 4 (four) hours as needed for severe pain. 03/08/21   Sanjuana Kava, MD  traMADol (ULTRAM) 50 MG tablet Take 50 mg by mouth every 6 (six) hours as needed.  01/13/20   [provider]  Vibegron (GEMTESA) 75 MG TABS Take 75 mg by mouth daily. 08/30/21   Summerlin, Berneice Heinrich, PA-C  Vitamin D, Ergocalciferol, (DRISDOL) 1.25 MG (50000 UNIT) CAPS capsule Take 1 capsule (50,000 Units total) by mouth every 7 (seven) days. 07/25/21   Georgia Lopes, DO    Family History Family History  Problem Relation Age of Onset   Asthma Mother    Heart disease Mother    Pancreatic cancer Mother    Ovarian cancer Mother    Diabetes Mother    Stroke Mother    High blood pressure Mother    Asthma Father    Colon cancer Father    Prostate cancer Father    Stroke Father    High blood pressure Father    Asthma Sister    Cancer Paternal Grandmother        mouth    Social History Social History   Tobacco Use   Smoking status: Never   Smokeless tobacco: Never  Vaping Use   Vaping Use: Never used  Substance Use Topics   Alcohol use: No   Drug use: Never     Allergies   Pantoprazole   Review of Systems Review of Systems Per HPI  Physical Exam Triage Vital Signs ED Triage Vitals  Enc Vitals Group     BP 08/31/21 1735 (!) 169/103     Pulse Rate 08/31/21 1735 78     Resp 08/31/21 1735 20     Temp 08/31/21 1735 98.8 F (37.1 C)     Temp Source 08/31/21 1735 Oral     SpO2 08/31/21 1735 97 %     Weight --      Height --      Head Circumference --      Peak Flow --      Pain Score 08/31/21 1732 0     Pain Loc --      Pain Edu? --      Excl. in [place]? --    No data found.  Updated Vital Signs BP (!) 169/103 (BP Location: Right Arm)   Pulse 78   Temp 98.8 F (37.1 C) (Oral)   Resp 20  SpO2 97%   Visual Acuity Right Eye  Distance:   Left Eye Distance:   Bilateral Distance:    Right Eye Near:   Left Eye Near:    Bilateral Near:     Physical Exam Vitals and nursing note reviewed.  Constitutional:      General: She is not in acute distress.    Appearance: Normal appearance. She is obese. She is not toxic-appearing.  HENT:     Head: Normocephalic and atraumatic.  Eyes:     General: No scleral icterus.    Extraocular Movements: Extraocular movements intact.  Cardiovascular:     Rate and Rhythm: Normal rate and regular rhythm.  Pulmonary:     Effort: Pulmonary effort is normal. No respiratory distress.     Breath sounds: Normal breath sounds. No stridor. No wheezing or rales.  Musculoskeletal:     Right lower leg: No edema.     Left lower leg: No edema.  Skin:    General: Skin is warm.     Capillary Refill: Capillary refill takes less than 2 seconds.     Coloration: Skin is not jaundiced or pale.     Findings: No erythema.  Neurological:     Mental Status: She is alert and oriented to person, place, and time.  Psychiatric:        Behavior: Behavior is cooperative.      UC Treatments / Results  Labs (all labs ordered are listed, but only abnormal results are displayed) Labs Reviewed - No data to display  EKG   Radiology No results found.  Procedures Procedures (including critical care time)  Medications Ordered in UC Medications - No data to display  Initial Impression / Assessment and Plan / UC Course  I have reviewed the triage vital signs and the nursing notes.  Pertinent labs & imaging results that were available during my care of the patient were reviewed by me and considered in my medical decision making (see chart for details).    Blood pressure is elevated in clinic today; I suspect this is likely multifactorial given her anxiety.  She has restarted back on her amlodipine 2.5 mg daily and I encouraged her to continue this medication.  We also discussed that she can  increase to amlodipine 5 mg daily if her blood pressure remains elevated over 140/90 at home.  Encouraged close follow-up with primary care within the next 2 to 4 weeks to reassess blood pressure and discussed adverse side effects.  ER precautions discussed-she is to go if she develops new symptoms like lightheadedness, dizziness, chest pain, shortness of breath, vision changes, or severe headache.  The patient was given the opportunity to ask questions.  All questions answered to their satisfaction.  The patient is in agreement to this plan.   Final Clinical Impressions(s) / UC Diagnoses   Final diagnoses:  Essential hypertension     Discharge Instructions      - Please continue the amlodipine for your blood pressure; if BP remains elevated you can increase to amlodipine 5 mg daily  - Follow up with your primary care provider in the next 2-4 weeks to recheck your blood pressure     ED Prescriptions   None    PDMP not reviewed this encounter.   Eulogio Bear, NP 08/31/21 1812

## 2021-08-31 NOTE — ED Triage Notes (Signed)
Pt reports her blood pressure was 151/91 today. Pt wants to check her blood pressure. Denies chest pain, headache, vision changes, nausea, vomiting, dizziness.

## 2021-09-06 ENCOUNTER — Telehealth (INDEPENDENT_AMBULATORY_CARE_PROVIDER_SITE_OTHER): Payer: No Typology Code available for payment source | Admitting: Bariatrics

## 2021-09-06 DIAGNOSIS — K219 Gastro-esophageal reflux disease without esophagitis: Secondary | ICD-10-CM

## 2021-09-06 DIAGNOSIS — E669 Obesity, unspecified: Secondary | ICD-10-CM | POA: Diagnosis not present

## 2021-09-06 DIAGNOSIS — Z6841 Body Mass Index (BMI) 40.0 and over, adult: Secondary | ICD-10-CM

## 2021-09-06 DIAGNOSIS — I10 Essential (primary) hypertension: Secondary | ICD-10-CM | POA: Diagnosis not present

## 2021-09-10 NOTE — Progress Notes (Unsigned)
TeleHealth Visit:  Due to the COVID-19 pandemic, this visit was completed with telemedicine (audio/video) technology to reduce patient and provider exposure as well as to preserve personal protective equipment.   Lanissa has verbally consented to this TeleHealth visit. The patient is located at home, the provider is located at the [facility] office. The participants in this visit include the listed provider and patient. The visit was conducted today via MyChart video.   Chief Complaint: OBESITY Dealva is here to discuss her progress with her obesity treatment plan along with follow-up of her obesity related diagnoses. Alta is on keeping a food journal and adhering to recommended goals of 1200 calories and 80 grams of protein and the Pescatarian Plan and states she is following her eating plan approximately 0% of the time. Coumba states she is doing 0 minutes 0 times per week.  Today's visit was #: 24 Starting weight: 336 lbs Starting date: 04/08/2018  Interim History: Yazmina states that her weight remains the same. She stopped all soda.   Subjective:   1. Essential hypertension Shemia is not checking her blood pressure at home. Her blood pressure was elevated at her last appointment at another office at 176/106.  2. Gastroesophageal reflux disease, unspecified whether esophagitis present Earla is currently taking Nexium.   Assessment/Plan:   1. Essential hypertension Twania will continue her medications and change her medications for blood pressure.  No added salt.  2. Gastroesophageal reflux disease, unspecified whether esophagitis present Patrica will continue Nexium, avoid trigger foods, and stop all soda.  3. Obesity, Current BMI 49.42 Seletha is currently in the action stage of change. As such, her goal is to continue with weight loss efforts. She has agreed to keeping a food journal and adhering to recommended goals of 1200 calories and 80 grams of protein daily or the  Pescatarian Plan.   Maggy will stay on her plan 80-90%. She will continue water consumption.   We will recheck fasting labs at her next visit.  Exercise goals: No exercise has been prescribed at this time.  Behavioral modification strategies: increasing lean protein intake, decreasing simple carbohydrates, increasing vegetables, increasing water intake, decreasing eating out, no skipping meals, meal planning and cooking strategies, keeping healthy foods in the home, and planning for success.  Jodeci has agreed to follow-up with our clinic in 4 weeks. She was informed of the importance of frequent follow-up visits to maximize her success with intensive lifestyle modifications for her multiple health conditions.  Objective:   VITALS: Per patient if applicable, see vitals. GENERAL: Alert and in no acute distress. CARDIOPULMONARY: No increased WOB. Speaking in clear sentences.  PSYCH: Pleasant and cooperative. Speech normal rate and rhythm. Affect is appropriate. Insight and judgement are appropriate. Attention is focused, linear, and appropriate.  NEURO: Oriented as arrived to appointment on time with no prompting.   Lab Results  Component Value Date   CREATININE 1.11 (H) 06/01/2020   BUN 31 (H) 06/01/2020   NA 141 06/01/2020   K 4.1 06/01/2020   CL 99 06/01/2020   CO2 23 06/01/2020   Lab Results  Component Value Date   ALT 16 06/01/2020   AST 16 06/01/2020   ALKPHOS 72 06/01/2020   BILITOT 0.4 06/01/2020   Lab Results  Component Value Date   HGBA1C 5.6 02/02/2020   HGBA1C 5.4 04/08/2018   Lab Results  Component Value Date   INSULIN 5.3 02/02/2020   INSULIN 4.4 04/08/2018   Lab Results  Component Value Date   TSH 1.650 02/02/2020   Lab Results  Component Value Date   CHOL 262 (H) 06/01/2020   HDL 91 06/01/2020   LDLCALC 158 (H) 06/01/2020   TRIG 80 06/01/2020   Lab Results  Component Value Date   VD25OH 20.3 (L) 06/01/2020   VD25OH 9.5 (L) 02/02/2020   VD25OH  6.1 (L) 04/08/2018   Lab Results  Component Value Date   WBC 4.5 12/20/2019   HGB 12.2 12/20/2019   HCT 37.6 12/20/2019   MCV 90.4 12/20/2019   PLT 216 12/20/2019   No results found for: "IRON", "TIBC", "FERRITIN"  Attestation Statements:   Reviewed by clinician on day of visit: allergies, medications, problem list, medical history, surgical history, family history, social history, and previous encounter notes.  Her total visit time was 20 minutes.    Trude Mcburney, am acting as Energy manager for [employer], DO.  I have reviewed the above documentation for accuracy and completeness, and I agree with the above. Corinna Capra, DO

## 2021-09-11 ENCOUNTER — Encounter (INDEPENDENT_AMBULATORY_CARE_PROVIDER_SITE_OTHER): Payer: Self-pay | Admitting: Bariatrics

## 2021-09-26 ENCOUNTER — Other Ambulatory Visit: Payer: No Typology Code available for payment source | Admitting: Urology

## 2021-09-26 DIAGNOSIS — N3281 Overactive bladder: Secondary | ICD-10-CM

## 2021-09-27 ENCOUNTER — Other Ambulatory Visit (HOSPITAL_COMMUNITY): Payer: Self-pay | Admitting: Internal Medicine

## 2021-09-27 DIAGNOSIS — Z1231 Encounter for screening mammogram for malignant neoplasm of breast: Secondary | ICD-10-CM

## 2021-10-03 ENCOUNTER — Ambulatory Visit (INDEPENDENT_AMBULATORY_CARE_PROVIDER_SITE_OTHER): Payer: No Typology Code available for payment source | Admitting: Urology

## 2021-10-03 ENCOUNTER — Encounter: Payer: Self-pay | Admitting: Urology

## 2021-10-03 VITALS — BP 116/79 | HR 77

## 2021-10-03 DIAGNOSIS — N3281 Overactive bladder: Secondary | ICD-10-CM | POA: Diagnosis not present

## 2021-10-03 LAB — URINALYSIS, ROUTINE W REFLEX MICROSCOPIC
Bilirubin, UA: NEGATIVE
Glucose, UA: NEGATIVE
Ketones, UA: NEGATIVE
Leukocytes,UA: NEGATIVE
Nitrite, UA: NEGATIVE
Protein,UA: NEGATIVE
Specific Gravity, UA: 1.01 (ref 1.005–1.030)
Urobilinogen, Ur: 0.2 mg/dL (ref 0.2–1.0)
pH, UA: 5 (ref 5.0–7.5)

## 2021-10-03 LAB — MICROSCOPIC EXAMINATION: Renal Epithel, UA: NONE SEEN /hpf

## 2021-10-03 MED ORDER — GEMTESA 75 MG PO TABS: 75.0000 mg | ORAL_TABLET | Freq: Every day | ORAL | 11 refills | Status: DC

## 2021-10-03 NOTE — Progress Notes (Signed)
10/03/2021 3:10 PM   Whitney Bauer 03/16/1956 [id number]  Referring provider: Carylon Perches, MD [address] [place],  [place] [address]  Followup OAB   HPI: Whitney Bauer is a 65yo here for followup for OAB. She was started on Gemtesa 75mg  daily last visit. Urinary frequency improved to 3-4x. Nocturia 0x since starting gemtesa   PMH: Past Medical History:  Diagnosis Date   Anemia    Arthritis    bilat. knees   Back pain    Back pain    Constipation    Edema of both lower extremities    Family history of adverse reaction to anesthesia    patients mother had cardiac issues during anesthesia   Food allergy    GERD (gastroesophageal reflux disease)    Hypertension    Joint pain    Joint pain    Lactose intolerance    Rheumatoid arthritis (HCC)    Swelling of both lower extremities    Vitamin D deficiency     Surgical History: Past Surgical History:  Procedure Laterality Date   BIOPSY  12/22/2019   Procedure: BIOPSY;  Surgeon: 12/24/2019, MD;  [facility];  Service: Endoscopy;;  antral   COLONOSCOPY N/A 01/06/2014   Procedure: COLONOSCOPY;  Surgeon: 01/08/2014, MD;  [facility];  Service: Endoscopy;  Laterality: N/A;  830   COLONOSCOPY WITH PROPOFOL N/A 12/22/2019   Procedure: COLONOSCOPY WITH PROPOFOL;  Surgeon: 12/24/2019, MD;  [facility];  Service: Endoscopy;  Laterality: N/A;  930   ESOPHAGOGASTRODUODENOSCOPY (EGD) WITH PROPOFOL N/A 12/22/2019   Procedure: ESOPHAGOGASTRODUODENOSCOPY (EGD) WITH PROPOFOL;  Surgeon: 12/24/2019, MD;  [facility];  Service: Endoscopy;  Laterality: N/A;   FOOT SURGERY     PARTIAL HYSTERECTOMY  jan 1999    Home Medications:  Allergies as of 10/03/2021       Reactions   Pantoprazole Other (See Comments)   Facial/mouth/jaw spasms (facial droop)        Medication List        Accurate as of October 03, 2021  3:10 PM. If you have any questions, ask your nurse  or doctor.          amLODipine 2.5 MG tablet Commonly known as: NORVASC Take 2.5 mg by mouth daily.   cyclobenzaprine 10 MG tablet Commonly known as: FLEXERIL TAKE 1 TABLET BY MOUTH DAILY AS NEEDED FOR MUSCLE SPASMS.   diclofenac 75 MG EC tablet Commonly known as: VOLTAREN TAKE 1 TABLET BY MOUTH 2 TIMES DAILY WITH A MEAL.   esomeprazole 40 MG capsule Commonly known as: NEXIUM Take 40 mg by mouth daily.   Gemtesa 75 MG Tabs Generic drug: Vibegron Take 75 mg by mouth daily.   loperamide 1 MG/5ML solution Commonly known as: IMODIUM Take 2-4 mg by mouth 4 (four) times daily as needed for diarrhea or loose stools.   olmesartan-hydrochlorothiazide 20-12.5 MG tablet Commonly known as: BENICAR HCT Take 1 tablet by mouth daily.   oxyCODONE-acetaminophen 5-325 MG tablet Commonly known as: PERCOCET/ROXICET Take 1 tablet by mouth every 4 (four) hours as needed for severe pain.   traMADol 50 MG tablet Commonly known as: ULTRAM Take 50 mg by mouth every 6 (six) hours as needed.   Vitamin D (Ergocalciferol) 1.25 MG (50000 UNIT) Caps capsule Commonly known as: DRISDOL Take 1 capsule (50,000 Units total) by mouth every 7 (seven) days.        Allergies:  Allergies  Allergen Reactions   Pantoprazole Other (See Comments)    Facial/mouth/jaw spasms (facial droop)    Family History: Family History  Problem Relation Age of Onset   Asthma Mother    Heart disease Mother    Pancreatic cancer Mother    Ovarian cancer Mother    Diabetes Mother    Stroke Mother    High blood pressure Mother    Asthma Father    Colon cancer Father    Prostate cancer Father    Stroke Father    High blood pressure Father    Asthma Sister    Cancer Paternal Grandmother        mouth    Social History:  reports that she has never smoked. She has never used smokeless tobacco. She reports that she does not drink alcohol and does not use drugs.  ROS: All other review of systems were reviewed  and are negative except what is noted above in HPI  Physical Exam: BP 116/79   Pulse 77   Constitutional:  Alert and oriented, No acute distress. HEENT: Whitney Bauer, moist mucus membranes.  Trachea midline, no masses. Cardiovascular: No clubbing, cyanosis, or edema. Respiratory: Normal respiratory effort, no increased work of breathing. GI: Abdomen is soft, nontender, nondistended, no abdominal masses GU: No CVA tenderness.  Lymph: No cervical or inguinal lymphadenopathy. Skin: No rashes, bruises or suspicious lesions. Neurologic: Grossly intact, no focal deficits, moving all 4 extremities. Psychiatric: Normal mood and affect.  Laboratory Data: Lab Results  Component Value Date   WBC 4.5 12/20/2019   HGB 12.2 12/20/2019   HCT 37.6 12/20/2019   MCV 90.4 12/20/2019   PLT 216 12/20/2019    Lab Results  Component Value Date   CREATININE 1.11 (H) 06/01/2020    No results found for: "PSA"  No results found for: "TESTOSTERONE"  Lab Results  Component Value Date   HGBA1C 5.6 02/02/2020    Urinalysis    Component Value Date/Time   COLORURINE YELLOW 04/15/2011 1441   APPEARANCEUR Clear 08/30/2021 1012   LABSPEC >1.030 (H) 04/15/2011 1441   PHURINE 5.5 04/15/2011 1441   GLUCOSEU Negative 08/30/2021 1012   HGBUR NEGATIVE 04/15/2011 1441   BILIRUBINUR Negative 08/30/2021 1012   KETONESUR TRACE (A) 04/15/2011 1441   PROTEINUR Trace (A) 08/30/2021 1012   PROTEINUR NEGATIVE 04/15/2011 1441   UROBILINOGEN 0.2 04/15/2011 1441   NITRITE Negative 08/30/2021 1012   NITRITE NEGATIVE 04/15/2011 1441   LEUKOCYTESUR Negative 08/30/2021 1012    Lab Results  Component Value Date   LABMICR Comment 08/30/2021   WBCUA 0-5 06/07/2021   LABEPIT 0-10 06/07/2021   MUCUS Present 06/07/2021   BACTERIA Few 06/07/2021    Pertinent Imaging:  No results found for this or any previous visit.  No results found for this or any previous visit.  No results found for this or any previous  visit.  No results found for this or any previous visit.  No results found for this or any previous visit.  No results found for this or any previous visit.  No results found for this or any previous visit.  No results found for this or any previous visit.   Assessment & Plan:    1. OAB (overactive bladder) Continue gemtesa 75mg  daily. RTC 6-8 weeks - Urinalysis, Routine w reflex microscopic   No follow-ups on file.  , MD  [facility] [place]

## 2021-10-03 NOTE — Patient Instructions (Signed)

## 2021-10-04 ENCOUNTER — Ambulatory Visit (HOSPITAL_COMMUNITY)
Admission: RE | Admit: 2021-10-04 | Discharge: 2021-10-04 | Disposition: A | Discharge status: Home / Self Care | Payer: No Typology Code available for payment source | Source: Ambulatory Visit | Attending: Internal Medicine | Admitting: Internal Medicine

## 2021-10-04 DIAGNOSIS — Z1231 Encounter for screening mammogram for malignant neoplasm of breast: Secondary | ICD-10-CM | POA: Diagnosis present

## 2021-10-17 ENCOUNTER — Encounter: Payer: Self-pay | Admitting: Orthopaedic Surgery

## 2021-10-17 ENCOUNTER — Ambulatory Visit (INDEPENDENT_AMBULATORY_CARE_PROVIDER_SITE_OTHER): Payer: No Typology Code available for payment source | Admitting: Orthopaedic Surgery

## 2021-10-17 DIAGNOSIS — M542 Cervicalgia: Secondary | ICD-10-CM

## 2021-10-17 MED ORDER — OXYCODONE-ACETAMINOPHEN 5-325 MG PO TABS: 1.0000 | ORAL_TABLET | ORAL | 0 refills | Status: DC | PRN

## 2021-10-17 MED ORDER — PREDNISONE 5 MG (21) PO TBPK: ORAL_TABLET | ORAL | 0 refills | Status: DC

## 2021-10-17 NOTE — Progress Notes (Signed)
My neck is worse.  She has paresthesias to the left shoulder and hand from the neck that is getting worse.  She has no trauma.  She is on diclofenac.  She had the MRI several weeks ago and has appointment to see Dr. Danielle Dess in late August.  She had seen him before.  Neck has pain on the left side and tightness.  NV intact.  Grips normal.  Encounter Diagnosis  Name Primary?   Cervicalgia Yes   I will give prednisone dose pack.  Hold off on diclofenac when taking.  I will renew pain medicine.  I have reviewed the [place] Controlled Substance Reporting System web site prior to prescribing narcotic medicine for this patient.  Return in six weeks.  Call if any problem.  Precautions discussed.  Electronically Signed Darreld Mclean, MD 7/26/202310:20 AM

## 2021-10-18 ENCOUNTER — Ambulatory Visit (INDEPENDENT_AMBULATORY_CARE_PROVIDER_SITE_OTHER): Payer: No Typology Code available for payment source | Admitting: Bariatrics

## 2021-10-30 ENCOUNTER — Other Ambulatory Visit (INDEPENDENT_AMBULATORY_CARE_PROVIDER_SITE_OTHER): Payer: Self-pay | Admitting: Bariatrics

## 2021-10-30 DIAGNOSIS — E559 Vitamin D deficiency, unspecified: Secondary | ICD-10-CM

## 2021-10-31 ENCOUNTER — Encounter (INDEPENDENT_AMBULATORY_CARE_PROVIDER_SITE_OTHER): Payer: Self-pay

## 2021-11-06 ENCOUNTER — Ambulatory Visit (INDEPENDENT_AMBULATORY_CARE_PROVIDER_SITE_OTHER): Payer: No Typology Code available for payment source | Admitting: Physician Assistant

## 2021-11-06 VITALS — BP 150/80 | HR 76

## 2021-11-06 DIAGNOSIS — N3281 Overactive bladder: Secondary | ICD-10-CM

## 2021-11-06 DIAGNOSIS — R300 Dysuria: Secondary | ICD-10-CM | POA: Diagnosis not present

## 2021-11-06 LAB — BLADDER SCAN AMB NON-IMAGING: Scan Result: 61

## 2021-11-06 NOTE — Progress Notes (Signed)
Assessment: 1. Dysuria - Urinalysis, Routine w reflex microscopic - BLADDER SCAN AMB NON-IMAGING  2. OAB (overactive bladder)    Plan: Patient is reassured that her urine indicates no evidence of an infection today.  We will continue Gemtesa and additional samples are given.  Discussed possibility that her frequent bubble baths can contribute to vaginal irritation as well as urethral irritation which could be causing some of her symptoms of burning and dysuria.  She will keep routine follow-up as already scheduled.  Chief Complaint: No chief complaint on file.   HPI: Whitney Bauer is a 66 y.o. female who presents for evaluation of dysuria.  10/03/21 Whitney Bauer is a 65yo here for recent onset of dysuria and mild burning without increase in frequency.  No gross hematuria.  No fever or chills. She was started on Gemtesa 75mg  daily last visit and is pleased with improvement of her OAB symptoms. Urinary frequency improved to 3-4x. Nocturia 0x since starting gemtesa.  She is only using 1-2 pads during her waking hours now.  Patient works at night and is taking her medications appropriately for her sleep cycle.  She admits to taking regular bubble baths which she has done for many years and is using the same detergents and soaps.  UA is clear except a trace of protein. PVR = 61 mL  08/30/21 Whitney Bauer is a 66 y.o. female who presents for continued evaluation of OAB and urinary urge incontinence.  Since starting the [place] a few months ago, the patient has seen no change in her symptoms.  She continues to go through at least 4 large heavy-duty pads per day and continues to have frequency and nocturia with 3-4 voids per night.  The pad she wears during the night are always soaked when she voids.  She denies incomplete emptying, burning, dysuria, pain.  Minimal stress incontinence.  No gross hematuria.  No new issues with constipation.  Minimal caffeine intake as previously discussed.  Medical  history also noted to be significant for L4-5 spondylolisthesis with spinal nerve ablation in the past. PVR = 0 mL UA = clear   06/07/21 Whitney Bauer is a 66 y.o. year old female with history of hypertension, morbid obesity, osteoarthritis who is seen in consultation from 66, MD  for evaluation of urge urinary incontinence, urgency, and frequency for the past few months. No UTIs.  No dysuria, burning, gross hematuria.  Patient reports that she has difficulty with leakage control after sitting for several hours. GYN history significant for partial hysterectomy in the past. Constipation-chronic, but well controlled with miralax Caffeine intake- 1-2/day Soda intake- none Number of pads used during day- 3 Number of pads used at night- 2-3 with a mattress protector as well   Medical records including office notes, labs reviewed during patient's office visit PVR = 0 mL UA = clear   Portions of the above documentation were copied from a prior visit for review purposes only.  Allergies: Allergies  Allergen Reactions   Pantoprazole Other (See Comments)    Facial/mouth/jaw spasms (facial droop)    PMH: Past Medical History:  Diagnosis Date   Anemia    Arthritis    bilat. knees   Back pain    Back pain    Constipation    Edema of both lower extremities    Family history of adverse reaction to anesthesia    patients mother had cardiac issues during anesthesia   Food allergy    GERD (  gastroesophageal reflux disease)    Hypertension    Joint pain    Joint pain    Lactose intolerance    Rheumatoid arthritis (HCC)    Swelling of both lower extremities    Vitamin D deficiency     PSH: Past Surgical History:  Procedure Laterality Date   BIOPSY  12/22/2019   Procedure: BIOPSY;  Surgeon: Malissa Hippo, MD;  [facility];  Service: Endoscopy;;  antral   COLONOSCOPY N/A 01/06/2014   Procedure: COLONOSCOPY;  Surgeon: Malissa Hippo, MD;  [facility];   Service: Endoscopy;  Laterality: N/A;  830   COLONOSCOPY WITH PROPOFOL N/A 12/22/2019   Procedure: COLONOSCOPY WITH PROPOFOL;  Surgeon: Malissa Hippo, MD;  [facility];  Service: Endoscopy;  Laterality: N/A;  930   ESOPHAGOGASTRODUODENOSCOPY (EGD) WITH PROPOFOL N/A 12/22/2019   Procedure: ESOPHAGOGASTRODUODENOSCOPY (EGD) WITH PROPOFOL;  Surgeon: Malissa Hippo, MD;  [facility];  Service: Endoscopy;  Laterality: N/A;   FOOT SURGERY     PARTIAL HYSTERECTOMY  jan 1999    SH: Social History   Tobacco Use   Smoking status: Never   Smokeless tobacco: Never  Vaping Use   Vaping Use: Never used  Substance Use Topics   Alcohol use: No   Drug use: Never    ROS: All other review of systems were reviewed and are negative except what is noted above in HPI  PE: BP (!) 150/80   Pulse 76  GENERAL APPEARANCE:  Well appearing, well developed, well nourished, NAD HEENT:  Atraumatic, normocephalic NECK:  Supple. Trachea midline ABDOMEN:  Soft, non-tender, no masses EXTREMITIES:  Moves all extremities well, without clubbing, cyanosis, or edema NEUROLOGIC:  Alert and oriented x 3, normal gait, CN II-XII grossly intact MENTAL STATUS:  appropriate BACK:  Non-tender to palpation, No CVAT SKIN:  Warm, dry, and intact   Results: Laboratory Data: Lab Results  Component Value Date   WBC 4.5 12/20/2019   HGB 12.2 12/20/2019   HCT 37.6 12/20/2019   MCV 90.4 12/20/2019   PLT 216 12/20/2019    Lab Results  Component Value Date   CREATININE 1.11 (H) 06/01/2020     Lab Results  Component Value Date   HGBA1C 5.6 02/02/2020    Urinalysis    Component Value Date/Time   COLORURINE YELLOW 04/15/2011 1441   APPEARANCEUR Clear 10/03/2021 1506   LABSPEC >1.030 (H) 04/15/2011 1441   PHURINE 5.5 04/15/2011 1441   GLUCOSEU Negative 10/03/2021 1506   HGBUR NEGATIVE 04/15/2011 1441   BILIRUBINUR Negative 10/03/2021 1506   KETONESUR TRACE (A) 04/15/2011 1441    PROTEINUR Negative 10/03/2021 1506   PROTEINUR NEGATIVE 04/15/2011 1441   UROBILINOGEN 0.2 04/15/2011 1441   NITRITE Negative 10/03/2021 1506   NITRITE NEGATIVE 04/15/2011 1441   LEUKOCYTESUR Negative 10/03/2021 1506    Lab Results  Component Value Date   LABMICR See below: 10/03/2021   WBCUA 0-5 10/03/2021   LABEPIT 0-10 10/03/2021   MUCUS Present 06/07/2021   BACTERIA Few 10/03/2021    Pertinent Imaging: No results found for this or any previous visit.  No results found for this or any previous visit.  No results found for this or any previous visit.  No results found for this or any previous visit.  No results found for this or any previous visit.  No results found for this or any previous visit.  No results found for this or any previous visit.  No results  found for this or any previous visit.  No results found for this or any previous visit (from the past 24 hour(s)).

## 2021-11-08 LAB — URINALYSIS, ROUTINE W REFLEX MICROSCOPIC
Bilirubin, UA: NEGATIVE
Glucose, UA: NEGATIVE
Ketones, UA: NEGATIVE
Leukocytes,UA: NEGATIVE
Nitrite, UA: NEGATIVE
RBC, UA: NEGATIVE
Specific Gravity, UA: 1.02 (ref 1.005–1.030)
Urobilinogen, Ur: 0.2 mg/dL (ref 0.2–1.0)
pH, UA: 5 (ref 5.0–7.5)

## 2021-11-22 ENCOUNTER — Ambulatory Visit (INDEPENDENT_AMBULATORY_CARE_PROVIDER_SITE_OTHER): Payer: No Typology Code available for payment source | Admitting: Bariatrics

## 2021-11-22 ENCOUNTER — Encounter (INDEPENDENT_AMBULATORY_CARE_PROVIDER_SITE_OTHER): Payer: Self-pay | Admitting: Bariatrics

## 2021-11-22 VITALS — BP 116/79 | HR 67 | Temp 97.9°F | Ht 65.0 in | Wt 298.0 lb

## 2021-11-22 DIAGNOSIS — Z6841 Body Mass Index (BMI) 40.0 and over, adult: Secondary | ICD-10-CM

## 2021-11-22 DIAGNOSIS — I10 Essential (primary) hypertension: Secondary | ICD-10-CM

## 2021-11-22 DIAGNOSIS — E7800 Pure hypercholesterolemia, unspecified: Secondary | ICD-10-CM | POA: Diagnosis not present

## 2021-11-22 DIAGNOSIS — E559 Vitamin D deficiency, unspecified: Secondary | ICD-10-CM

## 2021-11-22 DIAGNOSIS — R7309 Other abnormal glucose: Secondary | ICD-10-CM | POA: Diagnosis not present

## 2021-11-22 DIAGNOSIS — F5089 Other specified eating disorder: Secondary | ICD-10-CM

## 2021-11-22 DIAGNOSIS — F509 Eating disorder, unspecified: Secondary | ICD-10-CM | POA: Insufficient documentation

## 2021-11-22 DIAGNOSIS — E669 Obesity, unspecified: Secondary | ICD-10-CM

## 2021-11-22 MED ORDER — TOPIRAMATE 25 MG PO TABS: 25.0000 mg | ORAL_TABLET | Freq: Two times a day (BID) | ORAL | 0 refills | Status: DC

## 2021-11-22 MED ORDER — VITAMIN D (ERGOCALCIFEROL) 1.25 MG (50000 UNIT) PO CAPS: 50000.0000 [IU] | ORAL_CAPSULE | ORAL | 0 refills | Status: DC

## 2021-11-23 LAB — COMPREHENSIVE METABOLIC PANEL
ALT: 15 IU/L (ref 0–32)
AST: 13 IU/L (ref 0–40)
Albumin/Globulin Ratio: 2 (ref 1.2–2.2)
Albumin: 4.7 g/dL (ref 3.9–4.9)
Alkaline Phosphatase: 68 IU/L (ref 44–121)
BUN/Creatinine Ratio: 35 — ABNORMAL HIGH (ref 12–28)
BUN: 34 mg/dL — ABNORMAL HIGH (ref 8–27)
Bilirubin Total: 0.4 mg/dL (ref 0.0–1.2)
CO2: 23 mmol/L (ref 20–29)
Calcium: 9.4 mg/dL (ref 8.7–10.3)
Chloride: 103 mmol/L (ref 96–106)
Creatinine, Ser: 0.97 mg/dL (ref 0.57–1.00)
Globulin, Total: 2.4 g/dL (ref 1.5–4.5)
Glucose: 88 mg/dL (ref 70–99)
Potassium: 4.8 mmol/L (ref 3.5–5.2)
Sodium: 143 mmol/L (ref 134–144)
Total Protein: 7.1 g/dL (ref 6.0–8.5)
eGFR: 65 mL/min/{1.73_m2} (ref >59)

## 2021-11-23 LAB — HEMOGLOBIN A1C
Est. average glucose Bld gHb Est-mCnc: 114 mg/dL
Hgb A1c MFr Bld: 5.6 % (ref 4.8–5.6)

## 2021-11-23 LAB — INSULIN, RANDOM: INSULIN: 10.7 u[IU]/mL (ref 2.6–24.9)

## 2021-11-23 LAB — VITAMIN D 25 HYDROXY (VIT D DEFICIENCY, FRACTURES): Vit D, 25-Hydroxy: 21.3 ng/mL — ABNORMAL LOW (ref 30.0–100.0)

## 2021-11-23 LAB — LIPID PANEL WITH LDL/HDL RATIO
Cholesterol, Total: 243 mg/dL — ABNORMAL HIGH (ref 100–199)
HDL: 79 mg/dL (ref >39)
LDL Chol Calc (NIH): 148 mg/dL — ABNORMAL HIGH (ref 0–99)
LDL/HDL Ratio: 1.9 ratio (ref 0.0–3.2)
Triglycerides: 93 mg/dL (ref 0–149)
VLDL Cholesterol Cal: 16 mg/dL (ref 5–40)

## 2021-11-28 ENCOUNTER — Ambulatory Visit: Payer: No Typology Code available for payment source | Admitting: Orthopaedic Surgery

## 2021-11-28 ENCOUNTER — Ambulatory Visit: Payer: No Typology Code available for payment source | Admitting: Urology

## 2021-12-04 NOTE — Progress Notes (Unsigned)
Chief Complaint:   OBESITY Whitney Bauer is here to discuss her progress with her obesity treatment plan along with follow-up of her obesity related diagnoses. Whitney Bauer is on keeping a food journal and adhering to recommended goals of 1200 calories and 80 grams of protein daily and states she is following her eating plan approximately 0% of the time. Whitney Bauer states she is doing 0 minutes 0 times per week.  Today's visit was #: 25 Starting weight: 336 lbs Starting date: 04/08/2018 Today's weight: 298 lbs Today's date: 11/22/2021 Total lbs lost to date: 38 Total lbs lost since last in-office visit: 0  Interim History: Whitney Bauer is up 1 lb since her last visit.   Subjective:   1. Vitamin D deficiency Whitney Bauer is taking Vitamin D, and she notes minimal sun exposure.   2. Essential hypertension Whitney Bauer's blood pressure is controlled.   3. Elevated cholesterol Whitney Bauer is trying to make changes.   4. Elevated glucose Whitney Bauer is not on medications.   5. Other disorder of eating Whitney Bauer is not on medications currently.   Assessment/Plan:   1. Vitamin D deficiency We will check labs today. [name] will continue prescription Vitamin D 50,000 IU once weekly, and we will refill for 1 month.  - Vitamin D, Ergocalciferol, (DRISDOL) 1.25 MG (50000 UNIT) CAPS capsule; Take 1 capsule (50,000 Units total) by mouth every 7 (seven) days.  Dispense: 6 capsule; Refill: 0 - VITAMIN D 25 Hydroxy (Vit-D Deficiency, Fractures)  2. Essential hypertension We will check labs today, and [name] will continue her medications.   - Comprehensive metabolic panel  3. Elevated cholesterol We will check labs today, and we will follow-up at [name]'s next visit.    - Lipid Panel With LDL/HDL Ratio - Comprehensive metabolic panel  4. Elevated glucose We will check labs today, and we will follow-up at [name]'s next visit.   - Hemoglobin A1c - Insulin, random  5. Other disorder of eating [name] agreed to start Topamax 25 mg BID with no  refills.   - topiramate (TOPAMAX) 25 MG tablet; Take 1 tablet (25 mg total) by mouth 2 (two) times daily.  Dispense: 60 tablet; Refill: 0  6. Obesity, Current BMI 49.6 Whitney Bauer is currently in the action stage of change. As such, her goal is to continue with weight loss efforts. She has agreed to keeping a food journal and adhering to recommended goals of 1200 calories and 80 grams of protein and practicing portion control and making smarter food choices, such as increasing vegetables and decreasing simple carbohydrates.   Meal planning and intentional eating were discussed. Decrease red meat and increase protein.   Exercise goals: No exercise has been prescribed at this time. (Knee pain)  Behavioral modification strategies: increasing lean protein intake, decreasing simple carbohydrates, increasing vegetables, increasing water intake, decreasing eating out, no skipping meals, meal planning and cooking strategies, keeping healthy foods in the home, and planning for success.  Dedee has agreed to follow-up with our clinic in 4 to 6 weeks with Whitney Bauer. She was informed of the importance of frequent follow-up visits to maximize her success with intensive lifestyle modifications for her multiple health conditions.   Objective:   Blood pressure 116/79, pulse 67, temperature 97.9 F (36.6 C), height 5\' 5"  (1.651 m), weight 298 lb (135.2 kg), SpO2 99 %. Body mass index is 49.59 kg/m.  General: Cooperative, alert, well developed, in no acute distress. HEENT: Conjunctivae and lids unremarkable. Cardiovascular: Regular rhythm.  Lungs: Normal work of breathing.  Neurologic: No focal deficits.   Lab Results  Component Value Date   CREATININE 0.97 11/22/2021   BUN 34 (H) 11/22/2021   NA 143 11/22/2021   K 4.8 11/22/2021   CL 103 11/22/2021   CO2 23 11/22/2021   Lab Results  Component Value Date   ALT 15 11/22/2021   AST 13 11/22/2021   ALKPHOS 68 11/22/2021   BILITOT 0.4 11/22/2021   Lab  Results  Component Value Date   HGBA1C 5.6 11/22/2021   HGBA1C 5.6 02/02/2020   HGBA1C 5.4 04/08/2018   Lab Results  Component Value Date   INSULIN 10.7 11/22/2021   INSULIN 5.3 02/02/2020   INSULIN 4.4 04/08/2018   Lab Results  Component Value Date   TSH 1.650 02/02/2020   Lab Results  Component Value Date   CHOL 243 (H) 11/22/2021   HDL 79 11/22/2021   LDLCALC 148 (H) 11/22/2021   TRIG 93 11/22/2021   Lab Results  Component Value Date   VD25OH 21.3 (L) 11/22/2021   VD25OH 20.3 (L) 06/01/2020   VD25OH 9.5 (L) 02/02/2020   Lab Results  Component Value Date   WBC 4.5 12/20/2019   HGB 12.2 12/20/2019   HCT 37.6 12/20/2019   MCV 90.4 12/20/2019   PLT 216 12/20/2019   No results found for: "IRON", "TIBC", "FERRITIN"  Attestation Statements:   Reviewed by clinician on day of visit: allergies, medications, problem list, medical history, surgical history, family history, social history, and previous encounter notes.   Trude Mcburney, am acting as Energy manager for [employer], DO.  I have reviewed the above documentation for accuracy and completeness, and I agree with the above. Whitney Capra, DO

## 2021-12-05 ENCOUNTER — Encounter (INDEPENDENT_AMBULATORY_CARE_PROVIDER_SITE_OTHER): Payer: Self-pay | Admitting: Bariatrics

## 2021-12-06 ENCOUNTER — Ambulatory Visit (INDEPENDENT_AMBULATORY_CARE_PROVIDER_SITE_OTHER): Payer: No Typology Code available for payment source | Admitting: Orthopaedic Surgery

## 2021-12-06 ENCOUNTER — Encounter: Payer: Self-pay | Admitting: Orthopaedic Surgery

## 2021-12-06 VITALS — BP 145/86 | HR 71 | Ht 65.0 in | Wt 306.0 lb

## 2021-12-06 DIAGNOSIS — M542 Cervicalgia: Secondary | ICD-10-CM

## 2021-12-06 DIAGNOSIS — M25561 Pain in right knee: Secondary | ICD-10-CM

## 2021-12-06 DIAGNOSIS — M25562 Pain in left knee: Secondary | ICD-10-CM | POA: Diagnosis not present

## 2021-12-06 DIAGNOSIS — Z6841 Body Mass Index (BMI) 40.0 and over, adult: Secondary | ICD-10-CM

## 2021-12-06 DIAGNOSIS — G8929 Other chronic pain: Secondary | ICD-10-CM

## 2021-12-06 NOTE — Progress Notes (Signed)
My neck is better.  She has been having neck pain.  She has taken Flexeril and it has finally helped.  Her neck is better today.  She has bilateral knee pain, unchanged.  She is using a cane.  She has talked to her family doctor about taking the medicine given weekly by injection for weight loss.  I feel this is a good idea.  She has been approved by her insurance company for this.    Neck has full ROM.  NV intact.  Both knees with effusion, crepitus, and limited motion.  Limp to the right.  ROM right 0 to 95, left 0 to 100.  Encounter Diagnoses  Name Primary?   Cervicalgia Yes   Body mass index 50.0-59.9, adult (HCC)    Morbid obesity (HCC)    Chronic pain of left knee    Chronic pain of right knee    Return in three months.  Continue present medicine.  Call if any problem.  Precautions discussed.    Electronically Signed Darreld Mclean, MD 9/14/202310:50 AM

## 2021-12-14 ENCOUNTER — Other Ambulatory Visit (INDEPENDENT_AMBULATORY_CARE_PROVIDER_SITE_OTHER): Payer: Self-pay | Admitting: Bariatrics

## 2021-12-14 DIAGNOSIS — F5089 Other specified eating disorder: Secondary | ICD-10-CM

## 2021-12-26 ENCOUNTER — Ambulatory Visit: Payer: No Typology Code available for payment source | Admitting: Urology

## 2021-12-27 ENCOUNTER — Encounter (INDEPENDENT_AMBULATORY_CARE_PROVIDER_SITE_OTHER): Payer: Self-pay | Admitting: Family Medicine

## 2021-12-27 ENCOUNTER — Ambulatory Visit (INDEPENDENT_AMBULATORY_CARE_PROVIDER_SITE_OTHER): Payer: No Typology Code available for payment source | Admitting: Family Medicine

## 2021-12-27 VITALS — BP 125/78 | HR 80 | Temp 97.8°F | Ht 65.0 in | Wt 294.0 lb

## 2021-12-27 DIAGNOSIS — E7800 Pure hypercholesterolemia, unspecified: Secondary | ICD-10-CM | POA: Insufficient documentation

## 2021-12-27 DIAGNOSIS — R799 Abnormal finding of blood chemistry, unspecified: Secondary | ICD-10-CM | POA: Diagnosis not present

## 2021-12-27 DIAGNOSIS — E559 Vitamin D deficiency, unspecified: Secondary | ICD-10-CM | POA: Diagnosis not present

## 2021-12-27 DIAGNOSIS — E669 Obesity, unspecified: Secondary | ICD-10-CM

## 2021-12-27 DIAGNOSIS — Z6841 Body Mass Index (BMI) 40.0 and over, adult: Secondary | ICD-10-CM

## 2021-12-27 DIAGNOSIS — M170 Bilateral primary osteoarthritis of knee: Secondary | ICD-10-CM | POA: Diagnosis not present

## 2021-12-27 MED ORDER — VITAMIN D (ERGOCALCIFEROL) 1.25 MG (50000 UNIT) PO CAPS: 50000.0000 [IU] | ORAL_CAPSULE | ORAL | 0 refills | Status: DC

## 2021-12-28 ENCOUNTER — Ambulatory Visit: Payer: No Typology Code available for payment source | Admitting: Urology

## 2022-01-03 NOTE — Progress Notes (Signed)
Chief Complaint:   OBESITY Whitney Bauer is here to discuss her progress with her obesity treatment plan along with follow-up of her obesity related diagnoses. Whitney Bauer is on keeping a food journal and adhering to recommended goals of 1200 calories and 80 protein and states she is following her eating plan approximately 0% of the time. Whitney Bauer states she is not exercising.   Today's visit was #: 56 Starting weight: 336 lbs Starting date: 04/08/2018 Today's weight: 294 lbs Today's date: 12/27/2021 Total lbs lost to date: 42 lbs Total lbs lost since last in-office visit: 4 lbs  Interim History: Started Topamax 25 mg BID for food impulse control, but stopped it due to diarrhea.  Plans to start [facility] with her PCP.  She usually skips lunch due to working 3rd shift.  Eating snacks like an Atkins snack.   Subjective:   1. Osteoarthritis of both knees, unspecified osteoarthritis type She is using a cane.  Pain limits walking time.  She is fearful of water aerobics.  She needs BMI reduction for total knee replacement.   2. Vitamin D deficiency Discussed labs with patient today. Last Vitamin D level was 21.3, she is on prescription Vitamin D weekly.   3. Hypercholesterolemia Total cholesterol, 243, LDL 148.   The 10-year ASCVD risk score (Arnett DK, et al., 2019) is: 8.7%   Values used to calculate the score:     Age: 66 years     Sex: Female     Is Non-Hispanic African American: Yes     Diabetic: No     Tobacco smoker: No     Systolic Blood Pressure: 762 mmHg     Is BP treated: Yes     HDL Cholesterol: 79 mg/dL     Total Cholesterol: 243 mg/dL  4. Elevated BUN Discussed labs with patient today. BUN elevated, BUN/creatine ratio elevated.  Reports adequate fluid intake, on HCTZ with blood pressure medications.   Assessment/Plan:   1. Osteoarthritis of both knees, unspecified osteoarthritis type Continue BMI reduction, exercise as tolerated.   2. Vitamin D deficiency Refill - Vitamin D,  Ergocalciferol, (DRISDOL) 1.25 MG (50000 UNIT) CAPS capsule; Take 1 capsule (50,000 Units total) by mouth every 7 (seven) days.  Dispense: 6 capsule; Refill: 0  3. Hypercholesterolemia Declined statin use through PCP.   4. Elevated BUN Increase water intake to >64 oz per day.   5. Obesity,current BMI 49.0 1) Stay off Topamax due to GI side effects. 2) Consider Wegovy once available.   Whitney Bauer is currently in the action stage of change. As such, her goal is to continue with weight loss efforts. She has agreed to keeping a food journal and adhering to recommended goals of 1200 calories and 80 protein and the option of the [place].   Exercise goals:  Walking, biking 20  minutes 3 times per week.   Behavioral modification strategies: increasing lean protein intake, increasing vegetables, increasing water intake, decreasing liquid calories, decreasing eating out, no skipping meals, meal planning and cooking strategies, keeping healthy foods in the home, better snacking choices, and decreasing junk food.  Whitney Bauer has agreed to follow-up with our clinic in 4 weeks. She was informed of the importance of frequent follow-up visits to maximize her success with intensive lifestyle modifications for her multiple health conditions.   Objective:   Blood pressure 125/78, pulse 80, temperature 97.8 F (36.6 C), height 5\' 5"  (1.651 m), weight 294 lb (133.4 kg), SpO2 99 %. Body mass index is 48.92  kg/m.  General: Cooperative, alert, well developed, in no acute distress. HEENT: Conjunctivae and lids unremarkable. Cardiovascular: Regular rhythm.  Lungs: Normal work of breathing. Neurologic: No focal deficits.   Lab Results  Component Value Date   CREATININE 0.97 11/22/2021   BUN 34 (H) 11/22/2021   NA 143 11/22/2021   K 4.8 11/22/2021   CL 103 11/22/2021   CO2 23 11/22/2021   Lab Results  Component Value Date   ALT 15 11/22/2021   AST 13 11/22/2021   ALKPHOS 68 11/22/2021   BILITOT 0.4  11/22/2021   Lab Results  Component Value Date   HGBA1C 5.6 11/22/2021   HGBA1C 5.6 02/02/2020   HGBA1C 5.4 04/08/2018   Lab Results  Component Value Date   INSULIN 10.7 11/22/2021   INSULIN 5.3 02/02/2020   INSULIN 4.4 04/08/2018   Lab Results  Component Value Date   TSH 1.650 02/02/2020   Lab Results  Component Value Date   CHOL 243 (H) 11/22/2021   HDL 79 11/22/2021   LDLCALC 148 (H) 11/22/2021   TRIG 93 11/22/2021   Lab Results  Component Value Date   VD25OH 21.3 (L) 11/22/2021   VD25OH 20.3 (L) 06/01/2020   VD25OH 9.5 (L) 02/02/2020   Lab Results  Component Value Date   WBC 4.5 12/20/2019   HGB 12.2 12/20/2019   HCT 37.6 12/20/2019   MCV 90.4 12/20/2019   PLT 216 12/20/2019   No results found for: "IRON", "TIBC", "FERRITIN"  Attestation Statements:   Reviewed by clinician on day of visit: allergies, medications, problem list, medical history, surgical history, family history, social history, and previous encounter notes.  I, Malcolm Metro, am acting as Energy manager for Seymour Bars, DO.  I have reviewed the above documentation for accuracy and completeness, and I agree with the above. Glennis Brink, DO

## 2022-01-14 ENCOUNTER — Encounter: Payer: Self-pay | Admitting: Urology

## 2022-01-14 ENCOUNTER — Ambulatory Visit (INDEPENDENT_AMBULATORY_CARE_PROVIDER_SITE_OTHER): Payer: No Typology Code available for payment source | Admitting: Urology

## 2022-01-14 VITALS — BP 104/73 | HR 83

## 2022-01-14 DIAGNOSIS — N3281 Overactive bladder: Secondary | ICD-10-CM | POA: Diagnosis not present

## 2022-01-14 LAB — POCT URINALYSIS DIPSTICK
Bilirubin, UA: NEGATIVE
Blood, UA: NEGATIVE
Glucose, UA: NEGATIVE
Ketones, UA: NEGATIVE
Leukocytes, UA: NEGATIVE
Nitrite, UA: NEGATIVE
Protein, UA: NEGATIVE
Spec Grav, UA: 1.02 (ref 1.010–1.025)
Urobilinogen, UA: NEGATIVE E.U./dL — AB
pH, UA: 5 (ref 5.0–8.0)

## 2022-01-14 NOTE — Patient Instructions (Signed)

## 2022-01-14 NOTE — Progress Notes (Signed)
01/14/2022 3:41 PM   ALEYNA CUEVA 1956-02-17 [id number]  Referring provider: Asencion Noble, MD [address] [place],  [place] [address]  Followup urge incontinence   HPI: [name] is a 66yo here for followup for urge incontinence and OAB. She remains on gemtesa which has improved her urge incontinence and improved her nocturia to 0-1x. NO straining to urinate She wears pads on occasion.    PMH: Past Medical History:  Diagnosis Date   Anemia    Arthritis    bilat. knees   Back pain    Back pain    Constipation    Edema of both lower extremities    Family history of adverse reaction to anesthesia    patients mother had cardiac issues during anesthesia   Food allergy    GERD (gastroesophageal reflux disease)    Hypertension    Joint pain    Joint pain    Lactose intolerance    Rheumatoid arthritis (HCC)    Swelling of both lower extremities    Vitamin D deficiency     Surgical History: Past Surgical History:  Procedure Laterality Date   BIOPSY  12/22/2019   Procedure: BIOPSY;  Surgeon: Rogene Houston, MD;  [facility];  Service: Endoscopy;;  antral   COLONOSCOPY N/A 01/06/2014   Procedure: COLONOSCOPY;  Surgeon: Rogene Houston, MD;  [facility];  Service: Endoscopy;  Laterality: N/A;  830   COLONOSCOPY WITH PROPOFOL N/A 12/22/2019   Procedure: COLONOSCOPY WITH PROPOFOL;  Surgeon: Rogene Houston, MD;  [facility];  Service: Endoscopy;  Laterality: N/A;  930   ESOPHAGOGASTRODUODENOSCOPY (EGD) WITH PROPOFOL N/A 12/22/2019   Procedure: ESOPHAGOGASTRODUODENOSCOPY (EGD) WITH PROPOFOL;  Surgeon: Rogene Houston, MD;  [facility];  Service: Endoscopy;  Laterality: N/A;   FOOT SURGERY     PARTIAL HYSTERECTOMY  jan 1999    Home Medications:  Allergies as of 01/14/2022       Reactions   Pantoprazole Other (See Comments)   Facial/mouth/jaw spasms (facial droop)        Medication List        Accurate as of  January 14, 2022  3:41 PM. If you have any questions, ask your nurse or doctor.          amLODipine 2.5 MG tablet Commonly known as: NORVASC Take 2.5 mg by mouth daily.   cyclobenzaprine 10 MG tablet Commonly known as: FLEXERIL TAKE 1 TABLET BY MOUTH DAILY AS NEEDED FOR MUSCLE SPASMS.   diclofenac 75 MG EC tablet Commonly known as: VOLTAREN TAKE 1 TABLET BY MOUTH 2 TIMES DAILY WITH A MEAL.   esomeprazole 40 MG capsule Commonly known as: NEXIUM Take 40 mg by mouth daily.   Gemtesa 75 MG Tabs Generic drug: Vibegron Take 75 mg by mouth daily.   loperamide 1 MG/5ML solution Commonly known as: IMODIUM Take 2-4 mg by mouth 4 (four) times daily as needed for diarrhea or loose stools.   olmesartan-hydrochlorothiazide 20-12.5 MG tablet Commonly known as: BENICAR HCT Take 1 tablet by mouth daily.   oxyCODONE-acetaminophen 5-325 MG tablet Commonly known as: PERCOCET/ROXICET Take 1 tablet by mouth every 4 (four) hours as needed for severe pain.   Vitamin D (Ergocalciferol) 1.25 MG (50000 UNIT) Caps capsule Commonly known as: DRISDOL Take 1 capsule (50,000 Units total) by mouth every 7 (seven) days.        Allergies:  Allergies  Allergen Reactions   Pantoprazole Other (See  Comments)    Facial/mouth/jaw spasms (facial droop)    Family History: Family History  Problem Relation Age of Onset   Asthma Mother    Heart disease Mother    Pancreatic cancer Mother    Ovarian cancer Mother    Diabetes Mother    Stroke Mother    High blood pressure Mother    Asthma Father    Colon cancer Father    Prostate cancer Father    Stroke Father    High blood pressure Father    Asthma Sister    Cancer Paternal Grandmother        mouth    Social History:  reports that she has never smoked. She has never used smokeless tobacco. She reports that she does not drink alcohol and does not use drugs.  ROS: All other review of systems were reviewed and are negative except what is  noted above in HPI  Physical Exam: BP 104/73   Pulse 83   Constitutional:  Alert and oriented, No acute distress. HEENT: [place] AT, moist mucus membranes.  Trachea midline, no masses. Cardiovascular: No clubbing, cyanosis, or edema. Respiratory: Normal respiratory effort, no increased work of breathing. GI: Abdomen is soft, nontender, nondistended, no abdominal masses GU: No CVA tenderness.  Lymph: No cervical or inguinal lymphadenopathy. Skin: No rashes, bruises or suspicious lesions. Neurologic: Grossly intact, no focal deficits, moving all 4 extremities. Psychiatric: Normal mood and affect.  Laboratory Data: Lab Results  Component Value Date   WBC 4.5 12/20/2019   HGB 12.2 12/20/2019   HCT 37.6 12/20/2019   MCV 90.4 12/20/2019   PLT 216 12/20/2019    Lab Results  Component Value Date   CREATININE 0.97 11/22/2021    No results found for: "PSA"  No results found for: "TESTOSTERONE"  Lab Results  Component Value Date   HGBA1C 5.6 11/22/2021    Urinalysis    Component Value Date/Time   COLORURINE YELLOW 04/15/2011 1441   APPEARANCEUR Clear 11/06/2021 1135   LABSPEC >1.030 (H) 04/15/2011 1441   PHURINE 5.5 04/15/2011 1441   GLUCOSEU Negative 11/06/2021 1135   HGBUR NEGATIVE 04/15/2011 1441   BILIRUBINUR neg 01/14/2022 1523   BILIRUBINUR Negative 11/06/2021 1135   KETONESUR TRACE (A) 04/15/2011 1441   PROTEINUR Negative 01/14/2022 1523   PROTEINUR Trace (A) 11/06/2021 1135   PROTEINUR NEGATIVE 04/15/2011 1441   UROBILINOGEN negative (A) 01/14/2022 1523   UROBILINOGEN 0.2 04/15/2011 1441   NITRITE neg 01/14/2022 1523   NITRITE Negative 11/06/2021 1135   NITRITE NEGATIVE 04/15/2011 1441   LEUKOCYTESUR Negative 01/14/2022 1523   LEUKOCYTESUR Negative 11/06/2021 1135    Lab Results  Component Value Date   LABMICR See below: 10/03/2021   WBCUA 0-5 10/03/2021   LABEPIT 0-10 10/03/2021   MUCUS Present 06/07/2021   BACTERIA Few 10/03/2021    Pertinent  Imaging:  No results found for this or any previous visit.  No results found for this or any previous visit.  No results found for this or any previous visit.  No results found for this or any previous visit.  No results found for this or any previous visit.  No valid procedures specified. No results found for this or any previous visit.  No results found for this or any previous visit.   Assessment & Plan:    1. OAB (overactive bladder) -Continue gemtesa 75mg  daily.  - POCT urinalysis dipstick   No follow-ups on file.  , MD  [facility] [place]

## 2022-02-07 ENCOUNTER — Ambulatory Visit (INDEPENDENT_AMBULATORY_CARE_PROVIDER_SITE_OTHER): Payer: No Typology Code available for payment source | Admitting: Family Medicine

## 2022-02-11 ENCOUNTER — Telehealth: Payer: Self-pay | Admitting: Radiology

## 2022-02-11 NOTE — Telephone Encounter (Signed)
Patient called, LMVM asking for a new handicap placard application.  She also wants to know the price. Please call her.

## 2022-02-12 NOTE — Telephone Encounter (Signed)
Placard paperwork placed in patient pick up box up front behind FO

## 2022-02-16 ENCOUNTER — Other Ambulatory Visit (INDEPENDENT_AMBULATORY_CARE_PROVIDER_SITE_OTHER): Payer: Self-pay | Admitting: Bariatrics

## 2022-02-16 DIAGNOSIS — E559 Vitamin D deficiency, unspecified: Secondary | ICD-10-CM

## 2022-02-28 ENCOUNTER — Telehealth (INDEPENDENT_AMBULATORY_CARE_PROVIDER_SITE_OTHER): Payer: No Typology Code available for payment source | Admitting: Family Medicine

## 2022-02-28 DIAGNOSIS — E669 Obesity, unspecified: Secondary | ICD-10-CM

## 2022-02-28 DIAGNOSIS — Z6841 Body Mass Index (BMI) 40.0 and over, adult: Secondary | ICD-10-CM

## 2022-02-28 DIAGNOSIS — E559 Vitamin D deficiency, unspecified: Secondary | ICD-10-CM | POA: Diagnosis not present

## 2022-02-28 DIAGNOSIS — M170 Bilateral primary osteoarthritis of knee: Secondary | ICD-10-CM

## 2022-03-04 ENCOUNTER — Encounter (INDEPENDENT_AMBULATORY_CARE_PROVIDER_SITE_OTHER): Payer: Self-pay | Admitting: Family Medicine

## 2022-03-07 ENCOUNTER — Ambulatory Visit: Payer: No Typology Code available for payment source | Admitting: Orthopaedic Surgery

## 2022-03-07 ENCOUNTER — Encounter: Payer: Self-pay | Admitting: Orthopaedic Surgery

## 2022-03-07 VITALS — BP 98/60 | HR 84 | Ht 65.0 in | Wt 294.0 lb

## 2022-03-07 DIAGNOSIS — Z6841 Body Mass Index (BMI) 40.0 and over, adult: Secondary | ICD-10-CM | POA: Diagnosis not present

## 2022-03-07 DIAGNOSIS — M25562 Pain in left knee: Secondary | ICD-10-CM

## 2022-03-07 DIAGNOSIS — M25561 Pain in right knee: Secondary | ICD-10-CM

## 2022-03-07 DIAGNOSIS — G8929 Other chronic pain: Secondary | ICD-10-CM

## 2022-03-07 MED ORDER — OXYCODONE-ACETAMINOPHEN 5-325 MG PO TABS: 1.0000 | ORAL_TABLET | ORAL | 0 refills | Status: DC | PRN

## 2022-03-07 NOTE — Patient Instructions (Signed)
Medication Instructions:  He will call in your medication  Testing/Procedures: N/A  Follow-Up: 3 months earlier if needed  Any Other Special Instructions Will Be Listed Below (If Applicable).    If you need a refill on your  medications prescribed by Dr. Hilda Lias before your next appointment, please call your pharmacy.

## 2022-03-07 NOTE — Progress Notes (Signed)
My knees hurt.  She has bilateral knee pain and swelling.  She has no new trauma.  She uses a cane.  She is feeling better today but most days the knees hurt.  She is not a candidate for total knee.  She is taking her medicine. She has no new trauma.  Both knees have effusion, crepitus, ROM both of 0 to 95, NV intact.  Encounter Diagnoses  Name Primary?   Chronic pain of left knee Yes   Chronic pain of right knee    Morbid obesity due to excess calories (HCC)    Body mass index 45.0-49.9, adult (HCC)    I have refilled pain medicine.  I have reviewed the [place] Controlled Substance Reporting System web site prior to prescribing narcotic medicine for this patient.  Return in three months.  Call if any problem.  Precautions discussed.  Electronically Signed Darreld Mclean, MD 12/14/20239:38 AM

## 2022-03-07 NOTE — Progress Notes (Addendum)
TeleHealth Visit:  Due to the COVID-19 pandemic, this visit was completed with telemedicine (audio/video) technology to reduce patient and provider exposure as well as to preserve personal protective equipment.   Whitney Bauer has verbally consented to this TeleHealth visit. The patient is located at home, the provider is located at the [facility] office. The participants in this visit include the listed provider and patient. The visit was conducted today via video.  Chief Complaint: OBESITY Whitney Bauer is here to discuss her progress with her obesity treatment plan along with follow-up of her obesity related diagnoses. Whitney Bauer is on the Category 1 Plan and states she is following her eating plan approximately 40% of the time. Whitney Bauer states she is not exercising.  Today's visit was #: 27 Starting weight: 336 LBS Starting date: 04/08/2018  Interim History: Patient has had a head cold since Monday.  She is on a pescatarian plan, but has not been shopping, her sister does.  She denies eating out.  Eating some fruit, veggies, but this is beans, rice, potatoes.  She denies hunger or cravings.  She is a Financial trader.  Subjective:   1. Vitamin D deficiency Started prescription vitamin D 50,000 IU every week.  Last vitamin D level was 21.3.  2. Osteoarthritis of both knees, unspecified osteoarthritis type She is using a cane to walk.  Her knee pain limits her exercise.  She has lost 42 pounds since January 2020 with our program.  Assessment/Plan:   1. Vitamin D deficiency Refill vitamin D 50,000 IU weekly #5 capsules 0 refills.  2. Osteoarthritis of both knees, unspecified osteoarthritis type Continue weight reduction.  Recommend water exercise.  3. Obesity,current BMI 48.92 1.  Avoid having eaten at work, home. 2.  Increase NEAT.  Whitney Bauer is currently in the action stage of change. As such, her goal is to continue with weight loss efforts. She has agreed to the [employer].    Exercise goals: All adults should avoid inactivity. Some physical activity is better than none, and adults who participate in any amount of physical activity gain some health benefits.  Behavioral modification strategies: increasing lean protein intake, increasing water intake, decreasing eating out, no skipping meals, meal planning and cooking strategies, keeping healthy foods in the home, and planning for success.  Whitney Bauer has agreed to follow-up with our clinic in 4 weeks. She was informed of the importance of frequent follow-up visits to maximize her success with intensive lifestyle modifications for her multiple health conditions.  Objective:   VITALS: Per patient if applicable, see vitals. GENERAL: Alert and in no acute distress. CARDIOPULMONARY: No increased WOB. Speaking in clear sentences.  PSYCH: Pleasant and cooperative. Speech normal rate and rhythm. Affect is appropriate. Insight and judgement are appropriate. Attention is focused, linear, and appropriate.  NEURO: Oriented as arrived to appointment on time with no prompting.   Lab Results  Component Value Date   CREATININE 0.97 11/22/2021   BUN 34 (H) 11/22/2021   NA 143 11/22/2021   K 4.8 11/22/2021   CL 103 11/22/2021   CO2 23 11/22/2021   Lab Results  Component Value Date   ALT 15 11/22/2021   AST 13 11/22/2021   ALKPHOS 68 11/22/2021   BILITOT 0.4 11/22/2021   Lab Results  Component Value Date   HGBA1C 5.6 11/22/2021   HGBA1C 5.6 02/02/2020   HGBA1C 5.4 04/08/2018   Lab Results  Component Value Date   INSULIN 10.7 11/22/2021   INSULIN 5.3 02/02/2020  INSULIN 4.4 04/08/2018   Lab Results  Component Value Date   TSH 1.650 02/02/2020   Lab Results  Component Value Date   CHOL 243 (H) 11/22/2021   HDL 79 11/22/2021   LDLCALC 148 (H) 11/22/2021   TRIG 93 11/22/2021   Lab Results  Component Value Date   VD25OH 21.3 (L) 11/22/2021   VD25OH 20.3 (L) 06/01/2020   VD25OH 9.5 (L) 02/02/2020   Lab  Results  Component Value Date   WBC 4.5 12/20/2019   HGB 12.2 12/20/2019   HCT 37.6 12/20/2019   MCV 90.4 12/20/2019   PLT 216 12/20/2019   No results found for: "IRON", "TIBC", "FERRITIN"  Attestation Statements:   Reviewed by clinician on day of visit: allergies, medications, problem list, medical history, surgical history, family history, social history, and previous encounter notes.  I, Malcolm Metro, am acting as Energy manager for Seymour Bars, DO.  I have reviewed the above documentation for accuracy and completeness, and I agree with the above. Glennis Brink, DO

## 2022-03-14 MED ORDER — VITAMIN D (ERGOCALCIFEROL) 1.25 MG (50000 UNIT) PO CAPS: 50000.0000 [IU] | ORAL_CAPSULE | ORAL | 0 refills | Status: DC

## 2022-03-29 NOTE — Progress Notes (Signed)
Received fax from [facility]. Patients Oxycodone 5-325 has been approved 03/29/22-09/25/2022  Received email stating PA needed for oxycodone. PA submitted via covermymeds

## 2022-04-15 ENCOUNTER — Telehealth: Payer: Self-pay | Admitting: Orthopaedic Surgery

## 2022-05-02 ENCOUNTER — Other Ambulatory Visit (HOSPITAL_COMMUNITY): Payer: Self-pay | Admitting: Internal Medicine

## 2022-05-02 ENCOUNTER — Ambulatory Visit (HOSPITAL_COMMUNITY)
Admission: RE | Admit: 2022-05-02 | Discharge: 2022-05-02 | Disposition: A | Discharge status: Home / Self Care | Payer: No Typology Code available for payment source | Source: Ambulatory Visit | Attending: Internal Medicine | Admitting: Internal Medicine

## 2022-05-02 DIAGNOSIS — R0781 Pleurodynia: Secondary | ICD-10-CM | POA: Insufficient documentation

## 2022-05-08 ENCOUNTER — Telehealth: Payer: Self-pay

## 2022-05-08 NOTE — Telephone Encounter (Signed)
Patient needing samples of  Vibegron (GEMTESA) 75 MG TABS   Call when ready for pick up.

## 2022-05-16 ENCOUNTER — Encounter: Payer: No Typology Code available for payment source | Admitting: Orthopaedic Surgery

## 2022-05-20 ENCOUNTER — Telehealth: Payer: Self-pay

## 2022-05-20 DIAGNOSIS — N3281 Overactive bladder: Secondary | ICD-10-CM

## 2022-05-20 MED ORDER — GEMTESA 75 MG PO TABS: 75.0000 mg | ORAL_TABLET | Freq: Every day | ORAL | 11 refills | Status: DC

## 2022-05-20 NOTE — Telephone Encounter (Signed)
Patient is aware that samples are up front and a rx for medication has been sent to her pharmacy. Patient voiced understanding.

## 2022-05-20 NOTE — Telephone Encounter (Signed)
Patient called and advised they needed a refill on medication below.   Medication: Gemtesa 75 mg   [id number] [place]  Thank you

## 2022-05-21 ENCOUNTER — Ambulatory Visit: Payer: No Typology Code available for payment source | Admitting: Orthopaedic Surgery

## 2022-05-21 ENCOUNTER — Encounter: Payer: Self-pay | Admitting: Orthopaedic Surgery

## 2022-05-21 VITALS — BP 150/99 | HR 90 | Ht 65.0 in | Wt 302.0 lb

## 2022-05-21 DIAGNOSIS — G5602 Carpal tunnel syndrome, left upper limb: Secondary | ICD-10-CM

## 2022-05-21 NOTE — Progress Notes (Signed)
My left hand goes numb  She has had problems with the left hand going numb in the palm and the radial fingers.  She has no trauma.  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  She has negative Tinel and Phalens but decreased sensation of the index and long fingers and some of thumb.  I will get EMGs.  I will give cock-up splint.  Encounter Diagnosis  Name Primary?   Carpal tunnel syndrome, left upper limb Yes   Return after EMGs.  Call if any problem.  Precautions discussed.  Electronically Signed Sanjuana Kava, MD 2/27/20243:16 PM

## 2022-05-21 NOTE — Patient Instructions (Signed)
You have been referred to see  Dr.Newton for a nerve conduction study on your left arm/wrist [facility] PHONE: [phone] If you haven't been contacted within a week, you can call them to schedule your appointment.

## 2022-05-23 ENCOUNTER — Encounter: Payer: Self-pay | Admitting: Radiology

## 2022-05-24 ENCOUNTER — Telehealth: Payer: Self-pay | Admitting: Orthopaedic Surgery

## 2022-05-24 NOTE — Telephone Encounter (Signed)
Patient lvm for Abby, stated she was supposed to get a call back about NCS and she hasn't heard anything.  Pt's # [phone]

## 2022-05-24 NOTE — Telephone Encounter (Signed)
Spoke with patient. Advised her she can call and schedule her appt. Phone number to [facility] office provided.

## 2022-05-28 ENCOUNTER — Telehealth: Payer: Self-pay | Admitting: Physical Medicine and Rehabilitation

## 2022-05-28 NOTE — Telephone Encounter (Signed)
Spoke with patient and scheduled NCV for 06/05/22

## 2022-05-28 NOTE — Telephone Encounter (Signed)
Patient called to schedule with Dr. Ernestina Patches.

## 2022-06-05 ENCOUNTER — Ambulatory Visit: Payer: No Typology Code available for payment source | Admitting: Physical Medicine and Rehabilitation

## 2022-06-05 DIAGNOSIS — R202 Paresthesia of skin: Secondary | ICD-10-CM | POA: Diagnosis not present

## 2022-06-05 NOTE — Progress Notes (Signed)
Functional Pain Scale - descriptive words and definitions  Moderate (4)   Constantly aware of pain, can complete ADLs with modification/sleep marginally affected at times/passive distraction is of no use, but active distraction gives some relief. Moderate range order  Average Pain  varies  Right handed. Left hand cramps and locks up. Can radiate up to the elbow

## 2022-06-06 ENCOUNTER — Ambulatory Visit: Payer: No Typology Code available for payment source | Admitting: Orthopaedic Surgery

## 2022-06-11 NOTE — Procedures (Signed)
EMG & NCV Findings: Evaluation of the left median motor nerve showed prolonged distal onset latency (4.7 ms) and decreased conduction velocity (Elbow-Wrist, 48 m/s).  The left median (across palm) sensory nerve showed no response (Palm) and prolonged distal peak latency (4.3 ms).  All remaining nerves (as indicated in the following tables) were within normal limits.    All examined muscles (as indicated in the following table) showed no evidence of electrical instability.    Impression: The above electrodiagnostic study is ABNORMAL and reveals evidence of a moderate left median nerve entrapment at the wrist (carpal tunnel syndrome) affecting sensory and motor components.   There is no significant electrodiagnostic evidence of any other focal nerve entrapment, brachial plexopathy, cervical radiculopathy.   Recommendations: 1.  Follow-up with referring physician. 2.  Continue current management of symptoms. 3.  Continue use of resting splint at night-time and as needed during the day. 4.  Suggest surgical evaluation.  ___________________________ Laurence Spates FAAPMR Board Certified, American Board of Physical Medicine and Rehabilitation    Nerve Conduction Studies Anti Sensory Summary Table   Stim Site NR Peak (ms) Norm Peak (ms) P-T Amp (V) Norm P-T Amp Site1 Site2 Delta-P (ms) Dist (cm) Vel (m/s) Norm Vel (m/s)  Left Median Acr Palm Anti Sensory (2nd Digit)  30.8C  Wrist    *4.3 <3.6 31.0 >10 Wrist Palm  0.0    Palm *NR  <2.0          Left Radial Anti Sensory (Base 1st Digit)  30.9C  Wrist    2.0 <3.1 33.6  Wrist Base 1st Digit 2.0 0.0    Left Ulnar Anti Sensory (5th Digit)  31.3C  Wrist    3.4 <3.7 24.5 >15.0 Wrist 5th Digit 3.4 14.0 41 >38   Motor Summary Table   Stim Site NR Onset (ms) Norm Onset (ms) O-P Amp (mV) Norm O-P Amp Site1 Site2 Delta-0 (ms) Dist (cm) Vel (m/s) Norm Vel (m/s)  Left Median Motor (Abd Poll Brev)  31C  Wrist    *4.7 <4.2 6.8 >5 Elbow Wrist 4.0 19.0  *48 >50  Elbow    8.7  6.8         Left Ulnar Motor (Abd Dig Min)  31.3C  Wrist    3.1 <4.2 6.5 >3 B Elbow Wrist 2.5 19.0 76 >53  B Elbow    5.6  5.1  A Elbow B Elbow 1.6 10.0 62 >53  A Elbow    7.2  6.6          EMG   Side Muscle Nerve Root Ins Act Fibs Psw Amp Dur Poly Recrt Int Fraser Din Comment  Left Abd Poll Brev Median C8-T1 Nml Nml Nml Nml Nml 0 Nml Nml   Left 1stDorInt Ulnar C8-T1 Nml Nml Nml Nml Nml 0 Nml Nml   Left PronatorTeres Median C6-7 Nml Nml Nml Nml Nml 0 Nml Nml   Left Biceps Musculocut C5-6 Nml Nml Nml Nml Nml 0 Nml Nml   Left Deltoid Axillary C5-6 Nml Nml Nml Nml Nml 0 Nml Nml     Nerve Conduction Studies Anti Sensory Left/Right Comparison   Stim Site L Lat (ms) R Lat (ms) L-R Lat (ms) L Amp (V) R Amp (V) L-R Amp (%) Site1 Site2 L Vel (m/s) R Vel (m/s) L-R Vel (m/s)  Median Acr Palm Anti Sensory (2nd Digit)  30.8C  Wrist *4.3   31.0   Wrist [employer]  Radial Anti Sensory (Base 1st Digit)  30.9C  Wrist 2.0   33.6   Wrist Base 1st Digit     Ulnar Anti Sensory (5th Digit)  31.3C  Wrist 3.4   24.5   Wrist 5th Digit 41     Motor Left/Right Comparison   Stim Site L Lat (ms) R Lat (ms) L-R Lat (ms) L Amp (mV) R Amp (mV) L-R Amp (%) Site1 Site2 L Vel (m/s) R Vel (m/s) L-R Vel (m/s)  Median Motor (Abd Poll Brev)  31C  Wrist *4.7   6.8   Elbow Wrist *48    Elbow 8.7   6.8         Ulnar Motor (Abd Dig Min)  31.3C  Wrist 3.1   6.5   B Elbow Wrist 76    B Elbow 5.6   5.1   A Elbow B Elbow 62    A Elbow 7.2   6.6            Waveforms:

## 2022-06-11 NOTE — Progress Notes (Signed)
Whitney Bauer - 67 y.o. female MRN [id number]  Date of birth: Jun 10, 1955  Office Visit Note: Visit Date: 06/05/2022 PCP: Asencion Noble, MD Referred by: Sanjuana Kava, MD  Subjective: Chief Complaint  Patient presents with   Left Hand - Pain   HPI:  Whitney Bauer is a 67 y.o. female who comes in today at the request of Dr. Sanjuana Kava for evaluation and management of chronic, worsening and severe pain, numbness and tingling in the Left upper extremities.  Patient is Right hand dominant.  She reports having pain for some time in the left hand even though she is right-handed.  She reports some paresthesia type sensation into the more radial digits of that hand.  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  She also gets a lot of cramping which refers up into the forearm.  Her biggest complaint really is the cramping in general.  She does endorse what she feels like her and being somewhat weak.  She has not had any prior electrodiagnostic studies.  She is not diabetic.  Last hemoglobin A1c was 5.6.  Multiple joint complaints from a pain standpoint.    ROS Otherwise per HPI.  Assessment & Plan: Visit Diagnoses:    ICD-10-CM   1. Paresthesia of skin  R20.2 NCV with EMG (electromyography)      Plan: Impression: The above electrodiagnostic study is ABNORMAL and reveals evidence of a moderate left median nerve entrapment at the wrist (carpal tunnel syndrome) affecting sensory and motor components.    There is no significant electrodiagnostic evidence of any other focal nerve entrapment, brachial plexopathy, cervical radiculopathy.    Recommendations: 1.  Follow-up with referring physician. 2.  Continue current management of symptoms. 3.  Continue use of resting splint at night-time and as needed during the day. 4.  Suggest surgical evaluation.  Meds & Orders: No orders of the defined types were placed in this encounter.   Orders Placed This Encounter   Procedures   NCV with EMG (electromyography)    Follow-up: Return for Sanjuana Kava, MD.   Procedures: No procedures performed  EMG & NCV Findings: Evaluation of the left median motor nerve showed prolonged distal onset latency (4.7 ms) and decreased conduction velocity (Elbow-Wrist, 48 m/s).  The left median (across palm) sensory nerve showed no response (Palm) and prolonged distal peak latency (4.3 ms).  All remaining nerves (as indicated in the following tables) were within normal limits.    All examined muscles (as indicated in the following table) showed no evidence of electrical instability.    Impression: The above electrodiagnostic study is ABNORMAL and reveals evidence of a moderate left median nerve entrapment at the wrist (carpal tunnel syndrome) affecting sensory and motor components.   There is no significant electrodiagnostic evidence of any other focal nerve entrapment, brachial plexopathy, cervical radiculopathy.   Recommendations: 1.  Follow-up with referring physician. 2.  Continue current management of symptoms. 3.  Continue use of resting splint at night-time and as needed during the day. 4.  Suggest surgical evaluation.  ___________________________ Laurence Spates FAAPMR Board Certified, American Board of Physical Medicine and Rehabilitation    Nerve Conduction Studies Anti Sensory Summary Table   Stim Site NR Peak (ms) Norm Peak (ms) P-T Amp (V) Norm P-T Amp Site1 Site2 Delta-P (ms) Dist (cm) Vel (m/s) Norm Vel (m/s)  Left Median Acr Palm Anti Sensory (2nd Digit)  30.8C  Wrist    *4.3 <3.6 31.0 >10  Wrist Palm  0.0    Palm *NR  <2.0          Left Radial Anti Sensory (Base 1st Digit)  30.9C  Wrist    2.0 <3.1 33.6  Wrist Base 1st Digit 2.0 0.0    Left Ulnar Anti Sensory (5th Digit)  31.3C  Wrist    3.4 <3.7 24.5 >15.0 Wrist 5th Digit 3.4 14.0 41 >38   Motor Summary Table   Stim Site NR Onset (ms) Norm Onset (ms) O-P Amp (mV) Norm O-P Amp Site1 Site2  Delta-0 (ms) Dist (cm) Vel (m/s) Norm Vel (m/s)  Left Median Motor (Abd Poll Brev)  31C  Wrist    *4.7 <4.2 6.8 >5 Elbow Wrist 4.0 19.0 *48 >50  Elbow    8.7  6.8         Left Ulnar Motor (Abd Dig Min)  31.3C  Wrist    3.1 <4.2 6.5 >3 B Elbow Wrist 2.5 19.0 76 >53  B Elbow    5.6  5.1  A Elbow B Elbow 1.6 10.0 62 >53  A Elbow    7.2  6.6          EMG   Side Muscle Nerve Root Ins Act Fibs Psw Amp Dur Poly Recrt Int Fraser Din Comment  Left Abd Poll Brev Median C8-T1 Nml Nml Nml Nml Nml 0 Nml Nml   Left 1stDorInt Ulnar C8-T1 Nml Nml Nml Nml Nml 0 Nml Nml   Left PronatorTeres Median C6-7 Nml Nml Nml Nml Nml 0 Nml Nml   Left Biceps Musculocut C5-6 Nml Nml Nml Nml Nml 0 Nml Nml   Left Deltoid Axillary C5-6 Nml Nml Nml Nml Nml 0 Nml Nml     Nerve Conduction Studies Anti Sensory Left/Right Comparison   Stim Site L Lat (ms) R Lat (ms) L-R Lat (ms) L Amp (V) R Amp (V) L-R Amp (%) Site1 Site2 L Vel (m/s) R Vel (m/s) L-R Vel (m/s)  Median Acr Palm Anti Sensory (2nd Digit)  30.8C  Wrist *4.3   31.0   Wrist Palm     Palm             Radial Anti Sensory (Base 1st Digit)  30.9C  Wrist 2.0   33.6   Wrist Base 1st Digit     Ulnar Anti Sensory (5th Digit)  31.3C  Wrist 3.4   24.5   Wrist 5th Digit 41     Motor Left/Right Comparison   Stim Site L Lat (ms) R Lat (ms) L-R Lat (ms) L Amp (mV) R Amp (mV) L-R Amp (%) Site1 Site2 L Vel (m/s) R Vel (m/s) L-R Vel (m/s)  Median Motor (Abd Poll Brev)  31C  Wrist *4.7   6.8   Elbow Wrist *48    Elbow 8.7   6.8         Ulnar Motor (Abd Dig Min)  31.3C  Wrist 3.1   6.5   B Elbow Wrist 76    B Elbow 5.6   5.1   A Elbow B Elbow 62    A Elbow 7.2   6.6            Waveforms:             Clinical History: No specialty comments available.     Objective:  VS:  HT:    WT:   BMI:     BP:   HR: bpm  TEMP: ( )  RESP:  Physical Exam  Musculoskeletal:        General: No swelling, tenderness or deformity.     Comments: Inspection reveals no  atrophy of the bilateral APB or FDI or hand intrinsics. There is no swelling, color changes, allodynia or dystrophic changes. There is 5 out of 5 strength in the bilateral wrist extension, finger abduction and long finger flexion. There is intact sensation to light touch in all dermatomal and peripheral nerve distributions. There is a negative Froment's test bilaterally. There is a negative Tinel's test at the bilateral wrist and elbow. There is equivalent Phalen's test on the left. There is a negative Hoffmann's test bilaterally.  Skin:    General: Skin is warm and dry.     Findings: No erythema or rash.  Neurological:     General: No focal deficit present.     Mental Status: She is alert and oriented to person, place, and time.     Motor: No weakness or abnormal muscle tone.     Coordination: Coordination normal.  Psychiatric:        Mood and Affect: Mood normal.        Behavior: Behavior normal.      Imaging: No results found.

## 2022-06-18 ENCOUNTER — Ambulatory Visit: Payer: No Typology Code available for payment source | Admitting: Orthopaedic Surgery

## 2022-07-10 ENCOUNTER — Ambulatory Visit: Payer: No Typology Code available for payment source | Admitting: Urology

## 2022-07-10 ENCOUNTER — Encounter: Payer: Self-pay | Admitting: Urology

## 2022-07-10 VITALS — BP 108/69 | HR 82

## 2022-07-10 DIAGNOSIS — R350 Frequency of micturition: Secondary | ICD-10-CM

## 2022-07-10 NOTE — Progress Notes (Signed)
07/10/2022 3:37 PM   Whitney Bauer May 15, 1955 [id number]  Referring provider: Carylon Perches, MD [address] [place],  [place] [address]  Followup urinary frequency and OAB   HPI: Whitney Bauer is a 67yo here for followup for urinary frequency and OAB. She is doing well on gemtesa  daily. She has been out of the medication for 3 weeks and her LUTS worsened. No hematuria. No UTI.    PMH: Past Medical History:  Diagnosis Date   Anemia    Arthritis    bilat. knees   Back pain    Back pain    Constipation    Edema of both lower extremities    Family history of adverse reaction to anesthesia    patients mother had cardiac issues during anesthesia   Food allergy    GERD (gastroesophageal reflux disease)    Hypertension    Joint pain    Joint pain    Lactose intolerance    Rheumatoid arthritis (HCC)    Swelling of both lower extremities    Vitamin D deficiency     Surgical History: Past Surgical History:  Procedure Laterality Date   BIOPSY  12/22/2019   Procedure: BIOPSY;  Surgeon: Malissa Hippo, MD;  [facility]: [facility];  Service: Endoscopy;;  antral   COLONOSCOPY N/A 01/06/2014   Procedure: COLONOSCOPY;  Surgeon: Malissa Hippo, MD;  [facility]: [facility];  Service: Endoscopy;  Laterality: N/A;  830   COLONOSCOPY WITH PROPOFOL N/A 12/22/2019   Procedure: COLONOSCOPY WITH PROPOFOL;  Surgeon: Malissa Hippo, MD;  [facility]: [facility];  Service: Endoscopy;  Laterality: N/A;  930   ESOPHAGOGASTRODUODENOSCOPY (EGD) WITH PROPOFOL N/A 12/22/2019   Procedure: ESOPHAGOGASTRODUODENOSCOPY (EGD) WITH PROPOFOL;  Surgeon: Malissa Hippo, MD;  [facility]: [facility];  Service: Endoscopy;  Laterality: N/A;   FOOT SURGERY     PARTIAL HYSTERECTOMY  jan 1999    Home Medications:  Allergies as of 07/10/2022       Reactions   Pantoprazole Other (See Comments)   Facial/mouth/jaw spasms (facial droop)        Medication List        Accurate as of July 10, 2022  3:37 PM. If you have any questions, ask your nurse or doctor.          cyclobenzaprine 10 MG tablet Commonly known as: FLEXERIL TAKE 1 TABLET BY MOUTH DAILY AS NEEDED FOR MUSCLE SPASMS.   diclofenac 75 MG EC tablet Commonly known as: VOLTAREN TAKE 1 TABLET BY MOUTH TWICE A DAY WITH MEALS   esomeprazole 40 MG capsule Commonly known as: NEXIUM Take 40 mg by mouth daily.   Gemtesa 75 MG Tabs Generic drug: Vibegron Take 1 tablet (75 mg total) by mouth daily.   loperamide 1 MG/5ML solution Commonly known as: IMODIUM Take 2-4 mg by mouth 4 (four) times daily as needed for diarrhea or loose stools.   olmesartan-hydrochlorothiazide 20-12.5 MG tablet Commonly known as: BENICAR HCT Take 1 tablet by mouth daily.   oxyCODONE-acetaminophen 5-325 MG tablet Commonly known as: PERCOCET/ROXICET Take 1 tablet by mouth every 4 (four) hours as needed for severe pain.   Wegovy 0.25 MG/0.5ML Soaj Generic drug: Semaglutide-Weight Management Inject into the skin.        Allergies:  Allergies  Allergen Reactions   Pantoprazole Other (See Comments)    Facial/mouth/jaw spasms (facial droop)    Family History: Family History  Problem Relation Age of Onset   Asthma  Mother    Heart disease Mother    Pancreatic cancer Mother    Ovarian cancer Mother    Diabetes Mother    Stroke Mother    High blood pressure Mother    Asthma Father    Colon cancer Father    Prostate cancer Father    Stroke Father    High blood pressure Father    Asthma Sister    Cancer Paternal Grandmother        mouth    Social History:  reports that she has never smoked. She has never used smokeless tobacco. She reports that she does not drink alcohol and does not use drugs.  ROS: All other review of systems were reviewed and are negative except what is noted above in HPI  Physical Exam: BP 108/69   Pulse 82   Constitutional:  Alert and oriented, No acute distress. HEENT: [place] AT, moist mucus  membranes.  Trachea midline, no masses. Cardiovascular: No clubbing, cyanosis, or edema. Respiratory: Normal respiratory effort, no increased work of breathing. GI: Abdomen is soft, nontender, nondistended, no abdominal masses GU: No CVA tenderness.  Lymph: No cervical or inguinal lymphadenopathy. Skin: No rashes, bruises or suspicious lesions. Neurologic: Grossly intact, no focal deficits, moving all 4 extremities. Psychiatric: Normal mood and affect.  Laboratory Data: Lab Results  Component Value Date   WBC 4.5 12/20/2019   HGB 12.2 12/20/2019   HCT 37.6 12/20/2019   MCV 90.4 12/20/2019   PLT 216 12/20/2019    Lab Results  Component Value Date   CREATININE 0.97 11/22/2021    No results found for: "PSA"  No results found for: "TESTOSTERONE"  Lab Results  Component Value Date   HGBA1C 5.6 11/22/2021    Urinalysis    Component Value Date/Time   COLORURINE YELLOW 04/15/2011 1441   APPEARANCEUR Clear 11/06/2021 1135   LABSPEC >1.030 (H) 04/15/2011 1441   PHURINE 5.5 04/15/2011 1441   GLUCOSEU Negative 11/06/2021 1135   HGBUR NEGATIVE 04/15/2011 1441   BILIRUBINUR neg 01/14/2022 1523   BILIRUBINUR Negative 11/06/2021 1135   KETONESUR TRACE (A) 04/15/2011 1441   PROTEINUR Negative 01/14/2022 1523   PROTEINUR Trace (A) 11/06/2021 1135   PROTEINUR NEGATIVE 04/15/2011 1441   UROBILINOGEN negative (A) 01/14/2022 1523   UROBILINOGEN 0.2 04/15/2011 1441   NITRITE neg 01/14/2022 1523   NITRITE Negative 11/06/2021 1135   NITRITE NEGATIVE 04/15/2011 1441   LEUKOCYTESUR Negative 01/14/2022 1523   LEUKOCYTESUR Negative 11/06/2021 1135    Lab Results  Component Value Date   LABMICR See below: 10/03/2021   WBCUA 0-5 10/03/2021   LABEPIT 0-10 10/03/2021   MUCUS Present 06/07/2021   BACTERIA Few 10/03/2021    Pertinent Imaging:  No results found for this or any previous visit.  No results found for this or any previous visit.  No results found for this or any  previous visit.  No results found for this or any previous visit.  No results found for this or any previous visit.  No valid procedures specified. No results found for this or any previous visit.  No results found for this or any previous visit.   Assessment & Plan:    1. Urinary frequency -continue gemtesa  daily  - Urinalysis, Routine w reflex microscopic   No follow-ups on file.  Wilkie Aye, MD  [facility] [place]

## 2022-07-10 NOTE — Patient Instructions (Signed)

## 2022-07-11 LAB — MICROSCOPIC EXAMINATION

## 2022-07-11 LAB — URINALYSIS, ROUTINE W REFLEX MICROSCOPIC
Bilirubin, UA: NEGATIVE
Glucose, UA: NEGATIVE
Nitrite, UA: POSITIVE — AB
Specific Gravity, UA: 1.03 (ref 1.005–1.030)
Urobilinogen, Ur: 0.2 mg/dL (ref 0.2–1.0)
pH, UA: 5 (ref 5.0–7.5)

## 2022-08-06 ENCOUNTER — Encounter: Payer: Self-pay | Admitting: Orthopaedic Surgery

## 2022-08-06 ENCOUNTER — Ambulatory Visit (INDEPENDENT_AMBULATORY_CARE_PROVIDER_SITE_OTHER): Payer: No Typology Code available for payment source | Admitting: Orthopaedic Surgery

## 2022-08-06 VITALS — BP 120/74 | HR 70 | Ht 65.0 in | Wt 302.0 lb

## 2022-08-06 DIAGNOSIS — M25561 Pain in right knee: Secondary | ICD-10-CM | POA: Diagnosis not present

## 2022-08-06 DIAGNOSIS — Z6841 Body Mass Index (BMI) 40.0 and over, adult: Secondary | ICD-10-CM

## 2022-08-06 DIAGNOSIS — M25562 Pain in left knee: Secondary | ICD-10-CM | POA: Diagnosis not present

## 2022-08-06 DIAGNOSIS — G8929 Other chronic pain: Secondary | ICD-10-CM

## 2022-08-06 MED ORDER — TRAMADOL HCL 50 MG PO TABS: 50.0000 mg | ORAL_TABLET | Freq: Four times a day (QID) | ORAL | 3 refills | Status: DC | PRN

## 2022-08-06 NOTE — Progress Notes (Signed)
My knees hurt.  She has lost weight on the [facility] but her insurance has stopped covering it.  She is appealing the decision.  Her knees hurt most of the time. She says the injection only helped slightly.  I will not repeat.  Both knees have effusion, crepitus and ROM 0 to 100.  Limp more to the left today.  NV intact.  I will change to ultram  Encounter Diagnoses  Name Primary?   Chronic pain of left knee Yes   Chronic pain of right knee    Body mass index 50.0-59.9, adult (HCC)    Morbid obesity (HCC)    See if she can get medicine to help reduce weight then consider total knees.  I have reviewed the [place] Controlled Substance Reporting System web site prior to prescribing narcotic medicine for this patient.  Return in two months.  Call if any problem.  Precautions discussed.  Electronically Signed Darreld Mclean, MD 5/14/20242:52 PM

## 2022-08-06 NOTE — Patient Instructions (Addendum)
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

## 2022-08-26 ENCOUNTER — Ambulatory Visit
Admission: EM | Admit: 2022-08-26 | Discharge: 2022-08-26 | Disposition: A | Discharge status: Home / Self Care | Payer: No Typology Code available for payment source | Attending: Nurse Practitioner | Admitting: Nurse Practitioner

## 2022-08-26 DIAGNOSIS — B029 Zoster without complications: Secondary | ICD-10-CM

## 2022-08-26 MED ORDER — PREDNISONE 20 MG PO TABS: 40.0000 mg | ORAL_TABLET | Freq: Every day | ORAL | 0 refills | Status: AC

## 2022-08-26 MED ORDER — VALACYCLOVIR HCL 1 G PO TABS: 1000.0000 mg | ORAL_TABLET | Freq: Three times a day (TID) | ORAL | 0 refills | Status: AC

## 2022-08-26 NOTE — ED Provider Notes (Signed)
[facility]    CSN: [id number] Arrival date & time: 08/26/22  1015      History   Chief Complaint Chief Complaint  Patient presents with   Fever   Facial Pain    HPI Whitney Bauer is a 67 y.o. female.   The history is provided by the patient.   The patient presents for complaints of pain on the left side of her face, left neck, and left ear that started over the past 2 days.  Patient noted that she got a primary care in the symptoms started shortly after that.  She states that she also tried to shampoo her hair the next day, but pain became worse.  She states that she has been running a fever at home.  Patient states that she also has swelling behind the left ear.  She has had friends and relatives check her scalp to see if she was burned from the perm, she states no lesions or sores were seen.  She states that she does not recall if she had chickenpox in the past.  She has not been vaccinated for shingles.  Past Medical History:  Diagnosis Date   Anemia    Arthritis    bilat. knees   Back pain    Back pain    Constipation    Edema of both lower extremities    Family history of adverse reaction to anesthesia    patients mother had cardiac issues during anesthesia   Food allergy    GERD (gastroesophageal reflux disease)    Hypertension    Joint pain    Joint pain    Lactose intolerance    Rheumatoid arthritis (HCC)    Swelling of both lower extremities    Vitamin D deficiency     Patient Active Problem List   Diagnosis Date Noted   Osteoarthritis of both knees 12/27/2021   Hypercholesterolemia 12/27/2021   Elevated BUN 12/27/2021   Elevated cholesterol 11/22/2021   Elevated glucose 11/22/2021   Eating disorder 11/22/2021   Spondylolisthesis at L3-L4 level 04/25/2021   Weakness of both arms 04/25/2021   Vitamin D deficiency 05/06/2018   Gastroesophageal reflux disease 04/13/2018   Essential hypertension 04/13/2018   Class 3 severe obesity with  serious comorbidity and body mass index (BMI) of 50.0 to 59.9 in adult (HCC) 04/13/2018   Derangement of posterior horn of medial meniscus of right knee 09/10/2016   Chronic pain of right knee 09/10/2016   DOE (dyspnea on exertion) 01/28/2011   Muscle weakness (generalized) 10/23/2010    Past Surgical History:  Procedure Laterality Date   BIOPSY  12/22/2019   Procedure: BIOPSY;  Surgeon: Malissa Hippo, MD;  Location: [facility];  Service: Endoscopy;;  antral   COLONOSCOPY N/A 01/06/2014   Procedure: COLONOSCOPY;  Surgeon: Malissa Hippo, MD;  Location: [facility];  Service: Endoscopy;  Laterality: N/A;  830   COLONOSCOPY WITH PROPOFOL N/A 12/22/2019   Procedure: COLONOSCOPY WITH PROPOFOL;  Surgeon: Malissa Hippo, MD;  Location: [facility];  Service: Endoscopy;  Laterality: N/A;  930   ESOPHAGOGASTRODUODENOSCOPY (EGD) WITH PROPOFOL N/A 12/22/2019   Procedure: ESOPHAGOGASTRODUODENOSCOPY (EGD) WITH PROPOFOL;  Surgeon: Malissa Hippo, MD;  Location: [facility];  Service: Endoscopy;  Laterality: N/A;   FOOT SURGERY     PARTIAL HYSTERECTOMY  jan 1999    OB History     Gravida  0   Para  0   Term  0  Preterm  0   AB  0   Living  0      SAB  0   IAB  0   Ectopic  0   Multiple  0   Live Births  0            Home Medications    Prior to Admission medications   Medication Sig Start Date End Date Taking? Authorizing Provider  predniSONE (DELTASONE) 20 MG tablet Take 2 tablets (40 mg total) by mouth daily with breakfast for 5 days. 08/26/22 08/31/22 Yes Ashford Clouse-Warren, Sadie Haber, NP  valACYclovir (VALTREX) 1000 MG tablet Take 1 tablet (1,000 mg total) by mouth 3 (three) times daily for 7 days. 08/26/22 09/02/22 Yes Hitoshi Werts-Warren, Sadie Haber, NP  cyclobenzaprine (FLEXERIL) 10 MG tablet TAKE 1 TABLET BY MOUTH DAILY AS NEEDED FOR MUSCLE SPASMS. 09/04/20   Darreld Mclean, MD  esomeprazole (NEXIUM) 40 MG capsule Take 40 mg by mouth daily.  11/22/19   [provider]  loperamide (IMODIUM) 1 MG/5ML solution Take 2-4 mg by mouth 4 (four) times daily as needed for diarrhea or loose stools.     [provider]  olmesartan-hydrochlorothiazide (BENICAR HCT) 20-12.5 MG tablet Take 1 tablet by mouth daily. 09/03/21   [provider]  oxyCODONE-acetaminophen (PERCOCET/ROXICET) 5-325 MG tablet Take 1 tablet by mouth every 4 (four) hours as needed for severe pain. 03/07/22   Darreld Mclean, MD  traMADol (ULTRAM) 50 MG tablet Take 1 tablet (50 mg total) by mouth every 6 (six) hours as needed. One tablet every six hours as needed for pain. 08/06/22   Darreld Mclean, MD  Vibegron (GEMTESA) 75 MG TABS Take 1 tablet (75 mg total) by mouth daily. 05/20/22   McKenzie, Mardene Celeste, MD    Family History Family History  Problem Relation Age of Onset   Asthma Mother    Heart disease Mother    Pancreatic cancer Mother    Ovarian cancer Mother    Diabetes Mother    Stroke Mother    High blood pressure Mother    Asthma Father    Colon cancer Father    Prostate cancer Father    Stroke Father    High blood pressure Father    Asthma Sister    Cancer Paternal Grandmother        mouth    Social History Social History   Tobacco Use   Smoking status: Never   Smokeless tobacco: Never  Vaping Use   Vaping Use: Never used  Substance Use Topics   Alcohol use: No   Drug use: Never     Allergies   Pantoprazole   Review of Systems Review of Systems Per HPI  Physical Exam Triage Vital Signs ED Triage Vitals  Enc Vitals Group     BP 08/26/22 1024 123/80     Pulse Rate 08/26/22 1024 (!) 115     Resp 08/26/22 1024 18     Temp 08/26/22 1024 (!) 101.2 F (38.4 C)     Temp Source 08/26/22 1024 Oral     SpO2 08/26/22 1024 98 %     Weight --      Height --      Head Circumference --      Peak Flow --      Pain Score 08/26/22 1028 5     Pain Loc --      Pain Edu? --      Excl. in GC? --    No  data found.  Updated Vital Signs BP  123/80 (BP Location: Right Arm)   Pulse (!) 115   Temp (!) 101.2 F (38.4 C) (Oral)   Resp 18   SpO2 98%   Visual Acuity Right Eye Distance:   Left Eye Distance:   Bilateral Distance:    Right Eye Near:   Left Eye Near:    Bilateral Near:     Physical Exam Vitals and nursing note reviewed.  Constitutional:      General: She is not in acute distress.    Appearance: Normal appearance.  HENT:     Head: Normocephalic. Left periorbital erythema present. Hair is normal.     Jaw: Tenderness (left cheek) and swelling (left cheek) present.     Comments: Increase sensitivity and tenderness noted to the left scalp.    Right Ear: Tympanic membrane, ear canal and external ear normal.     Left Ear: Hearing and tympanic membrane normal. Swelling present.     Ears:     Comments: External left ear is erythematous and swollen,.  It is tender to palpation.    Nose: Nose normal.     Mouth/Throat:     Lips: Pink.     Mouth: Mucous membranes are moist.     Pharynx: Oropharynx is clear. Uvula midline.  Eyes:     Extraocular Movements: Extraocular movements intact.     Conjunctiva/sclera: Conjunctivae normal.     Pupils: Pupils are equal, round, and reactive to light.  Cardiovascular:     Rate and Rhythm: Regular rhythm. Tachycardia present.     Pulses: Normal pulses.     Heart sounds: Normal heart sounds.  Pulmonary:     Effort: Pulmonary effort is normal.     Breath sounds: Normal breath sounds.  Abdominal:     General: Bowel sounds are normal.     Palpations: Abdomen is soft.     Tenderness: There is no abdominal tenderness.  Musculoskeletal:     Cervical back: Normal range of motion.  Lymphadenopathy:     Head:     Left side of head: Posterior auricular adenopathy present.  Skin:    General: Skin is warm and dry.  Neurological:     General: No focal deficit present.     Mental Status: She is alert and oriented to person, place, and time.  Psychiatric:        Mood and Affect:  Mood normal.        Behavior: Behavior normal.      UC Treatments / Results  Labs (all labs ordered are listed, but only abnormal results are displayed) Labs Reviewed - No data to display  EKG   Radiology No results found.  Procedures Procedures (including critical care time)  Medications Ordered in UC Medications - No data to display  Initial Impression / Assessment and Plan / UC Course  I have reviewed the triage vital signs and the nursing notes.  Pertinent labs & imaging results that were available during my care of the patient were reviewed by me and considered in my medical decision making (see chart for details).  The patient is well-appearing, she is in no acute distress, she is febrile and tachycardic.  This appears to be consistent with her diagnoses of shingles.  Symptoms appear to be consistent with shingles as patient has swelling to the left cheek/jaw, around the left eye, the outer left ear, along with postauricular lymphadenopathy.  Will treat with valacyclovir 1 g 3 times daily  for 7 days, and prednisone 40 mg for 5 days to help with inflammation.  Supportive care recommendations were provided and discussed with the patient to include continuing Tylenol for pain, fever, general discomfort, cool compresses to the affected area to help with pain or discomfort, and to cover the areas if they begin to blister and issues.  Patient was given strict follow-up precautions.  Patient is in agreement with this plan of care and verbalizes understanding.  All questions were answered.  Patient stable for discharge.   Final Clinical Impressions(s) / UC Diagnoses   Final diagnoses:  Herpes zoster without complication     Discharge Instructions      Take medication as prescribed. May continue Tylenol as needed for pain, fever, general discomfort. Cool compresses to the left face and left side of the head to help with pain or discomfort. If the areas begin to ooze, keep  them covered, this is when you are most contagious. Once symptoms resolve, recommend getting a shingles vaccine. If you have worsening rash around the eye that causes the eye to close, change in vision, fever greater than 102, please go to the emergency department immediately for further evaluation. Follow-up as needed.     ED Prescriptions     Medication Sig Dispense Auth. Provider   valACYclovir (VALTREX) 1000 MG tablet Take 1 tablet (1,000 mg total) by mouth 3 (three) times daily for 7 days. 21 tablet Cherron Blitzer-Warren, Sadie Haber, NP   predniSONE (DELTASONE) 20 MG tablet Take 2 tablets (40 mg total) by mouth daily with breakfast for 5 days. 10 tablet Chaz Mcglasson-Warren, Sadie Haber, NP      PDMP not reviewed this encounter.   Abran Cantor, NP 08/26/22 1050

## 2022-08-26 NOTE — Discharge Instructions (Signed)
Take medication as prescribed. May continue Tylenol as needed for pain, fever, general discomfort. Cool compresses to the left face and left side of the head to help with pain or discomfort. If the areas begin to ooze, keep them covered, this is when you are most contagious. Once symptoms resolve, recommend getting a shingles vaccine. If you have worsening rash around the eye that causes the eye to close, change in vision, fever greater than 102, please go to the emergency department immediately for further evaluation. Follow-up as needed.

## 2022-08-26 NOTE — ED Triage Notes (Signed)
Pt presents to UC w/ c/o pain on face, neck, and ears since Friday evening after getting a perm. On Saturday, she shampooed her hair to try to help the pain, but the pain became worse Saturday night and affected her sleep. Saturday, she had a fever and took tylenol.

## 2022-09-07 ENCOUNTER — Emergency Department (HOSPITAL_COMMUNITY)
Admission: EM | Admit: 2022-09-07 | Discharge: 2022-09-07 | Disposition: A | Discharge status: Home / Self Care | Payer: No Typology Code available for payment source | Attending: Emergency Medicine | Admitting: Emergency Medicine

## 2022-09-07 ENCOUNTER — Other Ambulatory Visit: Payer: Self-pay

## 2022-09-07 ENCOUNTER — Encounter (HOSPITAL_COMMUNITY): Payer: Self-pay | Admitting: Emergency Medicine

## 2022-09-07 DIAGNOSIS — L739 Follicular disorder, unspecified: Secondary | ICD-10-CM

## 2022-09-07 DIAGNOSIS — R220 Localized swelling, mass and lump, head: Secondary | ICD-10-CM | POA: Diagnosis present

## 2022-09-07 MED ORDER — ACETAMINOPHEN 325 MG PO TABS
650.0000 mg | ORAL_TABLET | Freq: Once | ORAL | Status: AC
  Administered 2022-09-07: 650 mg via ORAL
  Filled 2022-09-07: qty 2

## 2022-09-07 MED ORDER — DOXYCYCLINE HYCLATE 100 MG PO CAPS: 100.0000 mg | ORAL_CAPSULE | Freq: Two times a day (BID) | ORAL | 0 refills | Status: DC

## 2022-09-07 MED ORDER — DOXYCYCLINE HYCLATE 100 MG PO TABS
100.0000 mg | ORAL_TABLET | Freq: Once | ORAL | Status: AC
  Administered 2022-09-07: 100 mg via ORAL
  Filled 2022-09-07: qty 1

## 2022-09-07 NOTE — ED Triage Notes (Addendum)
Pt states she has had a chemical reaction to a perm she recently got. Pt c/o facial peeling and throbbing to ears and scalp. Was seen at Urgent care on 6/3 for same and given scripts for pain medication, a steroid, and abx for presumed shingles.

## 2022-09-07 NOTE — ED Provider Notes (Signed)
[place] [facility] Provider Note   CSN: [id number] Arrival date & time: 09/07/22  4098     History  Chief Complaint  Patient presents with   Allergic Reaction    Whitney Bauer is a 67 y.o. female.  The history is provided by the patient.  Patient presents for concern for allergic reaction to a recent perm.  She reports she had her hair permed around May 31.  Soon after she started having facial swelling and redness, and peeling.  It also included her ears and scalp.  She was seen in urgent care who thought she may have shingles she was treated with steroids and antivirals.  Her symptoms have not improved.  She has been seen by her primary doctor.  She is try to get into see a dermatologist.  No other new medications.  She does not wear make-up.  No new soaps or any other new exposures.  No difficulty breathing or swallowing.  There is no rash below her face, no rash on her body.      Home Medications Prior to Admission medications   Medication Sig Start Date End Date Taking? Authorizing Provider  doxycycline (VIBRAMYCIN) 100 MG capsule Take 1 capsule (100 mg total) by mouth 2 (two) times daily. 09/07/22  Yes Zadie Rhine, MD  cyclobenzaprine (FLEXERIL) 10 MG tablet TAKE 1 TABLET BY MOUTH DAILY AS NEEDED FOR MUSCLE SPASMS. 09/04/20   Darreld Mclean, MD  esomeprazole (NEXIUM) 40 MG capsule Take 40 mg by mouth daily.  11/22/19   [provider]  loperamide (IMODIUM) 1 MG/5ML solution Take 2-4 mg by mouth 4 (four) times daily as needed for diarrhea or loose stools.     [provider]  olmesartan-hydrochlorothiazide (BENICAR HCT) 20-12.5 MG tablet Take 1 tablet by mouth daily. 09/03/21   [provider]  oxyCODONE-acetaminophen (PERCOCET/ROXICET) 5-325 MG tablet Take 1 tablet by mouth every 4 (four) hours as needed for severe pain. 03/07/22   Darreld Mclean, MD  traMADol (ULTRAM) 50 MG tablet Take 1 tablet (50 mg total) by  mouth every 6 (six) hours as needed. One tablet every six hours as needed for pain. 08/06/22   Darreld Mclean, MD  Vibegron (GEMTESA) 75 MG TABS Take 1 tablet (75 mg total) by mouth daily. 05/20/22   McKenzie, Mardene Celeste, MD      Allergies    Pantoprazole    Review of Systems   Review of Systems  Physical Exam Updated Vital Signs BP 123/83   Pulse (!) 125   Temp 100 F (37.8 C) (Oral)   Resp 17   Ht 1.651 m (5\' 5" )   Wt 127 kg   SpO2 98%   BMI 46.59 kg/m  Physical Exam CONSTITUTIONAL: Well developed/well nourished HEAD: Normocephalic/atraumatic Raised lesions noted on her scalp.  The largest 1 is tender to palpation but no significant fluctuance.  No crepitus. EYES: EOMI/PERRL, there is no lesions in the conjunctiva ENMT: Mucous membranes moist no angioedema, no stridor.  No drooling.  There is no intraoral lesions Mild swelling noted around her ears NECK: supple no meningeal signs NEURO: Pt is awake/alert/appropriate, moves all extremitiesx4.  No facial droop.   SKIN: warm, color normal, see photo Erythema noted to her face bilaterally, left> right PSYCH: no abnormalities of mood noted, alert and oriented to situation     ED Results / Procedures / Treatments   Labs (all labs ordered are listed, but only abnormal results are displayed) Labs  Reviewed - No data to display  EKG None  Radiology No results found.  Procedures Procedures    Medications Ordered in ED Medications  doxycycline (VIBRA-TABS) tablet 100 mg (100 mg Oral Given 09/07/22 0521)  acetaminophen (TYLENOL) tablet 650 mg (650 mg Oral Given 09/07/22 8119)    ED Course/ Medical Decision Making/ A&P                             Medical Decision Making Risk OTC drugs. Prescription drug management.   Patient presents for concerns for reaction to a perm product from several weeks ago.  It was initially thought that she had shingles and she is already been treated.  Unclear why her face is still  erythematous with some mild peeling.  She also appears to have folliculitis in her scalp, I performed a bedside ultrasound and there is no abscess to drain   For the scalp lesion she will be started on doxycycline. For her facial erythema, she will need to follow with her PCP and she is attempting to follow-up with a dermatologist. There is no signs of Stevens-Johnson syndrome or other life-threatening rash Patient is overall well-appearing, no acute distress and interactive.  Patient is tachycardic, though this appears to worsen when I enter the room.  It is in the low 100s prior to entering the room       Final Clinical Impression(s) / ED Diagnoses Final diagnoses:  Folliculitis    Rx / DC Orders ED Discharge Orders          Ordered    doxycycline (VIBRAMYCIN) 100 MG capsule  2 times daily        09/07/22 1478              Zadie Rhine, MD 09/07/22 [phone]

## 2022-10-08 ENCOUNTER — Ambulatory Visit: Payer: No Typology Code available for payment source | Admitting: Orthopedic Surgery

## 2022-10-08 ENCOUNTER — Ambulatory Visit: Payer: No Typology Code available for payment source | Admitting: Orthopaedic Surgery

## 2022-10-08 ENCOUNTER — Other Ambulatory Visit (INDEPENDENT_AMBULATORY_CARE_PROVIDER_SITE_OTHER): Payer: No Typology Code available for payment source

## 2022-10-08 ENCOUNTER — Encounter: Payer: Self-pay | Admitting: Orthopaedic Surgery

## 2022-10-08 VITALS — BP 127/77 | HR 96 | Ht 65.0 in | Wt 275.0 lb

## 2022-10-08 DIAGNOSIS — M1712 Unilateral primary osteoarthritis, left knee: Secondary | ICD-10-CM

## 2022-10-08 DIAGNOSIS — G8929 Other chronic pain: Secondary | ICD-10-CM

## 2022-10-08 DIAGNOSIS — M25562 Pain in left knee: Secondary | ICD-10-CM

## 2022-10-08 MED ORDER — TRAMADOL HCL 50 MG PO TABS: 50.0000 mg | ORAL_TABLET | Freq: Four times a day (QID) | ORAL | 3 refills | Status: DC | PRN

## 2022-10-08 NOTE — Progress Notes (Signed)
My left knee is worse.  She has more pain with the left knee with swelling and popping and giving way.  She has no new trauma.  She is taking the injections to lose weight and has done so.  I will get MRI of the left knee.  Left knee has effusion, crepitus, ROM 0 to 95 with pain, medial positive McMurray, limp left, NV intact.  Encounter Diagnosis  Name Primary?   Chronic pain of left knee Yes   X-rays were done of the left knee, reported separately.  I have reviewed the [place] Controlled Substance Reporting System web site prior to prescribing narcotic medicine for this patient.  Call if any problem.  Precautions discussed.  I will see her back in three weeks.  Electronically Signed Darreld Mclean, MD 7/16/20242:12 PM

## 2022-10-08 NOTE — Patient Instructions (Signed)
While we are working on your approval for MRI please go ahead and call to schedule your appointment with [facility] within at least one (1) week.  ° [phone]  ° °

## 2022-10-20 ENCOUNTER — Ambulatory Visit
Admission: RE | Admit: 2022-10-20 | Discharge: 2022-10-20 | Disposition: A | Discharge status: Home / Self Care | Payer: No Typology Code available for payment source | Source: Ambulatory Visit | Attending: Orthopaedic Surgery | Admitting: Orthopaedic Surgery

## 2022-10-20 DIAGNOSIS — G8929 Other chronic pain: Secondary | ICD-10-CM

## 2022-10-29 ENCOUNTER — Encounter: Payer: Self-pay | Admitting: Orthopaedic Surgery

## 2022-10-29 ENCOUNTER — Ambulatory Visit: Payer: No Typology Code available for payment source | Admitting: Orthopaedic Surgery

## 2022-10-29 VITALS — BP 96/69 | HR 83 | Ht 62.0 in | Wt 275.0 lb

## 2022-10-29 DIAGNOSIS — G8929 Other chronic pain: Secondary | ICD-10-CM

## 2022-10-29 DIAGNOSIS — M23204 Derangement of unspecified medial meniscus due to old tear or injury, left knee: Secondary | ICD-10-CM

## 2022-10-29 DIAGNOSIS — M23201 Derangement of unspecified lateral meniscus due to old tear or injury, left knee: Secondary | ICD-10-CM | POA: Diagnosis not present

## 2022-10-29 DIAGNOSIS — Z6841 Body Mass Index (BMI) 40.0 and over, adult: Secondary | ICD-10-CM

## 2022-10-29 DIAGNOSIS — M1712 Unilateral primary osteoarthritis, left knee: Secondary | ICD-10-CM

## 2022-10-29 NOTE — Progress Notes (Signed)
My knee is hurting.  She had the MRI of the left knee showing: IMPRESSION: 1. Severe tricompartmental osteoarthritis of the left knee. 2. Complex tearing of the medial and lateral menisci.  I have explained the findings to her.  She is a candidate for a total knee.  She is doing weight reduction. She will have excess tissue around the knee to remove.  I have told her to talk to her insurance company so we can get it approved to remove the excess tissue.  It will be an impediment unless removed if she has the total knee.  Left knee has crepitus, effusion, ROM 0 to 100, uses a cane, unstable, NV intact.  Encounter Diagnoses  Name Primary?   Chronic pain of left knee Yes   Body mass index 50.0-59.9, adult (HCC)    Morbid obesity (HCC)    I spent about 30 minutes talking to her, going over the knee MRI, discussing surgery and removal of the excess tissue.  Return in six weeks.  Call if any problem.  Precautions discussed.  Electronically Signed Darreld Mclean, MD 8/6/20243:24 PM

## 2022-12-11 ENCOUNTER — Ambulatory Visit: Payer: No Typology Code available for payment source | Admitting: Orthopaedic Surgery

## 2022-12-11 ENCOUNTER — Encounter: Payer: Self-pay | Admitting: Orthopaedic Surgery

## 2022-12-11 VITALS — BP 109/67 | HR 85 | Ht 62.0 in | Wt 272.0 lb

## 2022-12-11 DIAGNOSIS — Z6841 Body Mass Index (BMI) 40.0 and over, adult: Secondary | ICD-10-CM

## 2022-12-11 DIAGNOSIS — G8929 Other chronic pain: Secondary | ICD-10-CM

## 2022-12-11 DIAGNOSIS — M25562 Pain in left knee: Secondary | ICD-10-CM | POA: Diagnosis not present

## 2022-12-11 DIAGNOSIS — M25561 Pain in right knee: Secondary | ICD-10-CM

## 2022-12-11 MED ORDER — TRAMADOL HCL 50 MG PO TABS: 50.0000 mg | ORAL_TABLET | Freq: Four times a day (QID) | ORAL | 3 refills | Status: DC | PRN

## 2022-12-11 NOTE — Progress Notes (Signed)
She has chronic pain and DJD of the knees.  She has no new trauma.  She is using a cane.  She is on weight reducing medicine and is now below BMI 50.  She is out of her Tramadol.  ROM of both knees is 0 to 105, limp to right, uses cane, crepitus, effusion, positive medial McMurray both knees.  Encounter Diagnoses  Name Primary?   Chronic pain of left knee Yes   Chronic pain of right knee    Morbid obesity (HCC)    Body mass index 45.0-49.9, adult (HCC)    I have reviewed the [place] Controlled Substance Reporting System web site prior to prescribing narcotic medicine for this patient.  Return in six weeks.  Call if any problem.  Precautions discussed.  Electronically Signed Darreld Mclean, MD 9/18/20248:22 AM

## 2023-01-06 ENCOUNTER — Ambulatory Visit: Payer: No Typology Code available for payment source | Admitting: Urology

## 2023-01-07 ENCOUNTER — Telehealth: Payer: Self-pay | Admitting: Orthopaedic Surgery

## 2023-01-07 NOTE — Telephone Encounter (Signed)
Dr. Hilda Lias, I got an email from [employer], the company that completes FMLA and/or Disability Forms.  This is the message:  Can you provide a note for patient Whitney Bauer, Whitney Bauer 1955-05-10, MRN [id number]?  She is a patient of Dr. Hilda Lias.  I do not see any notes regarding out of work or a scheduled surgery.  Please advise.  Ty

## 2023-01-08 ENCOUNTER — Encounter: Payer: Self-pay | Admitting: Orthopaedic Surgery

## 2023-01-22 ENCOUNTER — Ambulatory Visit: Payer: No Typology Code available for payment source | Admitting: Orthopaedic Surgery

## 2023-01-22 ENCOUNTER — Encounter: Payer: No Typology Code available for payment source | Admitting: Orthopaedic Surgery

## 2023-01-22 ENCOUNTER — Encounter: Payer: Self-pay | Admitting: Orthopaedic Surgery

## 2023-01-22 VITALS — BP 119/83 | HR 92 | Ht 62.0 in | Wt 272.0 lb

## 2023-01-22 DIAGNOSIS — G8929 Other chronic pain: Secondary | ICD-10-CM

## 2023-01-22 DIAGNOSIS — M25562 Pain in left knee: Secondary | ICD-10-CM | POA: Diagnosis not present

## 2023-01-22 DIAGNOSIS — G252 Other specified forms of tremor: Secondary | ICD-10-CM | POA: Diagnosis not present

## 2023-01-22 NOTE — Progress Notes (Signed)
I just hurt  She has chronic pain of both knees.  She has marked degenerative changes.  She is losing weight slowly.  She is tried of hurting.  She has also developed an intention tremor on the right dominant hand. She has seen neurology and nothing done she says.  She has no trauma.  Both knees have crepitus, effusion, ROM right 0 to 100, left 0 to 95, limps, uses cane, NV intact.  Encounter Diagnoses  Name Primary?   Chronic pain of left knee Yes   Morbid obesity (HCC)    Intention tremor    Continue present medicine and weight reduction.  Return in about two months.  Call if any problem.  Precautions discussed.  Electronically Signed Darreld Mclean, MD 10/30/202410:07 AM

## 2023-04-02 ENCOUNTER — Ambulatory Visit: Payer: No Typology Code available for payment source | Admitting: Orthopaedic Surgery

## 2023-04-02 ENCOUNTER — Encounter: Payer: Self-pay | Admitting: Orthopaedic Surgery

## 2023-04-02 VITALS — BP 111/73 | HR 82 | Ht 62.0 in | Wt 244.4 lb

## 2023-04-02 DIAGNOSIS — M25561 Pain in right knee: Secondary | ICD-10-CM

## 2023-04-02 DIAGNOSIS — G8929 Other chronic pain: Secondary | ICD-10-CM

## 2023-04-02 DIAGNOSIS — M25562 Pain in left knee: Secondary | ICD-10-CM

## 2023-04-02 DIAGNOSIS — Z6841 Body Mass Index (BMI) 40.0 and over, adult: Secondary | ICD-10-CM

## 2023-04-02 MED ORDER — TRAMADOL HCL 50 MG PO TABS: 50.0000 mg | ORAL_TABLET | Freq: Four times a day (QID) | ORAL | 3 refills | Status: DC | PRN

## 2023-04-02 NOTE — Progress Notes (Signed)
 My knees hurt  She has more pain in the knees with the cold weather.  She is losing weight.  I have talked to her about using a scooter at work.  She has no new trauma.  Both knees have effusion and pain and crepitus, ROM right 0 to 95, left 0 to 100.  Limp right.  NV intact.  Encounter Diagnoses  Name Primary?   Chronic pain of left knee Yes   Chronic pain of right knee    Morbid obesity (HCC)    Body mass index 40.0-44.9, adult (HCC)    I have reviewed the [place]  Controlled Substance Reporting System web site prior to prescribing narcotic medicine for this patient.  Return in three months.  She may consider viscosupplementation.  Call and I can set up.  Call if any problem.  Precautions discussed.  Electronically Signed Lemond Stable, MD 1/8/20259:47 AM

## 2023-05-06 ENCOUNTER — Telehealth: Payer: Self-pay | Admitting: Orthopaedic Surgery

## 2023-05-06 NOTE — Telephone Encounter (Signed)
Dr. Sanjuan Dame pt - pt lvm stating that [facility] is having an issue w/her insurance for the Tramadol 50mg  that Dr. Hilda Lias prescribed in January.  She is requesting our office call [facility]. [phone]

## 2023-05-07 NOTE — Telephone Encounter (Signed)
LVM letting the patient know that she needs to contact her insurance and find out what is going on then call the pharmacy back

## 2023-05-22 ENCOUNTER — Encounter: Payer: Self-pay | Admitting: Orthopaedic Surgery

## 2023-05-22 ENCOUNTER — Ambulatory Visit: Payer: Commercial Managed Care - PPO | Admitting: Orthopaedic Surgery

## 2023-05-22 VITALS — BP 97/55 | HR 77 | Ht 62.0 in | Wt 240.0 lb

## 2023-05-22 DIAGNOSIS — R5381 Other malaise: Secondary | ICD-10-CM

## 2023-05-22 DIAGNOSIS — M170 Bilateral primary osteoarthritis of knee: Secondary | ICD-10-CM

## 2023-05-22 DIAGNOSIS — G8929 Other chronic pain: Secondary | ICD-10-CM

## 2023-05-22 MED ORDER — TRAMADOL HCL 50 MG PO TABS: 50.0000 mg | ORAL_TABLET | Freq: Four times a day (QID) | ORAL | 3 refills | Status: DC | PRN

## 2023-05-22 NOTE — Patient Instructions (Signed)
 Physical therapy has been ordered for you at [facility]. They should call you to schedule, [phone] is the phone number to call, if you want to call to schedule.

## 2023-05-22 NOTE — Progress Notes (Signed)
 [place]  She [place] the chair at the [place] in [place] the other week.  She could not get up.  She had no pain or injury but could not get up off the floor.  A gentleman helped her up to the chair.  She has done well since then but is very concerned she is so weak that if she falls again, how would she get up.  I will set up PT for deconditioning exercises for her.  They will help her also in teaching techniques to get out of bed, out of a chair with no arms, etc.  She is very motivated to do this.  She continues to lose weight on the GLP-1 medicine.  Dr. Ouida Sills is monitoring this.  Both knees are painful. She uses a cane.  Knees have mild effusions, crepitus, ROM right 0 to 95, left 0 to 100.  Gait is slow but steady.  NV intact.  She has had problems getting her pain medicine.  I will look for forms from her insurance company.  Encounter Diagnoses  Name Primary?   Chronic pain of left knee Yes   Chronic pain of right knee    Bilateral primary osteoarthritis of knee    Physical deconditioning    Return in two months.  Send in forms for pain medicine.  I have reviewed the [place] Controlled Substance Reporting System web site prior to prescribing narcotic medicine for this patient.  Call if any problem.  Precautions discussed.  Electronically Signed Darreld Mclean, MD 2/27/20259:21 AM

## 2023-05-22 NOTE — Telephone Encounter (Signed)
 To you FYI patient was here in office today Dr Hilda Lias told her to call her insurance company and find out what they need for her Tramadol to be filled He said to look out for something to be filled out for her for the Tramadol

## 2023-05-29 ENCOUNTER — Ambulatory Visit (HOSPITAL_COMMUNITY): Payer: Commercial Managed Care - PPO

## 2023-06-25 ENCOUNTER — Encounter (HOSPITAL_COMMUNITY): Payer: Self-pay

## 2023-06-25 ENCOUNTER — Other Ambulatory Visit: Payer: Self-pay

## 2023-06-25 ENCOUNTER — Ambulatory Visit (HOSPITAL_COMMUNITY): Attending: Orthopaedic Surgery

## 2023-06-25 DIAGNOSIS — R5381 Other malaise: Secondary | ICD-10-CM | POA: Diagnosis not present

## 2023-06-25 DIAGNOSIS — M25561 Pain in right knee: Secondary | ICD-10-CM | POA: Diagnosis present

## 2023-06-25 DIAGNOSIS — G8929 Other chronic pain: Secondary | ICD-10-CM | POA: Insufficient documentation

## 2023-06-25 DIAGNOSIS — M25562 Pain in left knee: Secondary | ICD-10-CM | POA: Diagnosis present

## 2023-06-25 DIAGNOSIS — M170 Bilateral primary osteoarthritis of knee: Secondary | ICD-10-CM | POA: Insufficient documentation

## 2023-06-25 DIAGNOSIS — R262 Difficulty in walking, not elsewhere classified: Secondary | ICD-10-CM | POA: Insufficient documentation

## 2023-06-25 NOTE — Therapy (Signed)
 OUTPATIENT PHYSICAL THERAPY LOWER EXTREMITY EVALUATION   Patient Name: Whitney Bauer MRN: [id number] DOB:01-Aug-1955, 68 y.o., female Today's Date: 06/25/2023  END OF SESSION:  PT End of Session - 06/25/23 1718     Visit Number 1    Date for PT Re-Evaluation 08/06/23    Authorization Type [employer] P[employer]    Authorization Time Period no auth required, 30 visits max    PT Start Time 1600    PT Stop Time 1700    PT Time Calculation (min) 60 min    Activity Tolerance Patient tolerated treatment well;Patient limited by fatigue;Patient limited by pain    Behavior During Therapy WFL for tasks assessed/performed             Past Medical History:  Diagnosis Date   Anemia    Arthritis    bilat. knees   Back pain    Back pain    Constipation    Edema of both lower extremities    Family history of adverse reaction to anesthesia    patients mother had cardiac issues during anesthesia   Food allergy    GERD (gastroesophageal reflux disease)    Hypertension    Joint pain    Joint pain    Lactose intolerance    Rheumatoid arthritis (HCC)    Swelling of both lower extremities    Vitamin D deficiency    Past Surgical History:  Procedure Laterality Date   BIOPSY  12/22/2019   Procedure: BIOPSY;  Surgeon: Malissa Hippo, MD;  [facility];  Service: Endoscopy;;  antral   COLONOSCOPY N/A 01/06/2014   Procedure: COLONOSCOPY;  Surgeon: Malissa Hippo, MD;  [facility]: [facility];  Service: Endoscopy;  Laterality: N/A;  830   COLONOSCOPY WITH PROPOFOL N/A 12/22/2019   Procedure: COLONOSCOPY WITH PROPOFOL;  Surgeon: Malissa Hippo, MD;  [facility]: [facility];  Service: Endoscopy;  Laterality: N/A;  930   ESOPHAGOGASTRODUODENOSCOPY (EGD) WITH PROPOFOL N/A 12/22/2019   Procedure: ESOPHAGOGASTRODUODENOSCOPY (EGD) WITH PROPOFOL;  Surgeon: Malissa Hippo, MD;  [facility]: [facility];  Service: Endoscopy;  Laterality: N/A;   FOOT SURGERY     PARTIAL HYSTERECTOMY  jan 1999    Patient Active Problem List   Diagnosis Date Noted   Osteoarthritis of both knees 12/27/2021   Hypercholesterolemia 12/27/2021   Elevated BUN 12/27/2021   Elevated cholesterol 11/22/2021   Elevated glucose 11/22/2021   Eating disorder 11/22/2021   Spondylolisthesis at L3-L4 level 04/25/2021   Weakness of both arms 04/25/2021   Vitamin D deficiency 05/06/2018   Gastroesophageal reflux disease 04/13/2018   Essential hypertension 04/13/2018   Class 3 severe obesity with serious comorbidity and body mass index (BMI) of 50.0 to 59.9 in adult [facility]) 04/13/2018   Derangement of posterior horn of medial meniscus of right knee 09/10/2016   Chronic pain of right knee 09/10/2016   DOE (dyspnea on exertion) 01/28/2011   Muscle weakness (generalized) 10/23/2010    PCP: Carylon Perches, MD   REFERRING PROVIDER: Darreld Mclean, MD  REFERRING DIAG: [phone] (ICD-10-CM) - Chronic pain of left knee M25.561,G89.29 (ICD-10-CM) - Chronic pain of right knee M17.0 (ICD-10-CM) - Bilateral primary osteoarthritis of knee R53.81 (ICD-10-CM) - Physical deconditioning  THERAPY DIAG:  Chronic pain of left knee  Chronic pain of right knee  Difficulty in walking, not elsewhere classified  Rationale for Evaluation and Treatment: Rehabilitation  ONSET DATE: knee pain has been going on for 10 years  SUBJECTIVE:   SUBJECTIVE STATEMENT:  Pt reports chronic pain of bilateral knees, rating of 15/10  left knee, 6/10 right knee upon presentation. 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 Pt wants a recommendation for a knee brace for left knee. Pt states that Dr. Has scared her from doing anything involving knees other than was is absolutely necessary and recommended TKA. [place] at [place] and was not able to get up at the time and needed to be lifted up. Pt states she would just be happy to get 25% improvement so she can walk better and wants to  walk at work with the cane instead of needing the rollator.   PERTINENT HISTORY: -On weight loss shots, lost significant amount of weight -been a year since last steroid shots -PT about a year ago here probably for the knee (not much help) PAIN:  Are you having pain? Yes: NPRS scale: 10/10 left knee, 6/10 right knee Pain location: all over the front of left knee, front of right knee Pain description: left knee out of socket feeling, right stiffens up quick Aggravating factors: walking, standing, getting up from sitting down Relieving factors: sitting, advil, ice better than heat  PRECAUTIONS: Fall  RED FLAGS: Bowel or bladder incontinence: Yes: worse when laying down, increased urine void 4 times per hour, wears pad at night    WEIGHT BEARING RESTRICTIONS: No  FALLS:  Has patient fallen in last 6 months? Yes. Number of falls 1, [employer], Valentines day, [facility] her up    LIVING ENVIRONMENT: Lives with: lives with their family, nephew Lives in: House/apartment Stairs: Yes: External: 3 steps; on right going up Has following equipment at home: Single point cane and Environmental consultant - 4 wheeled  OCCUPATION: mail processing in [place]  PLOF: Independent with household mobility with device and Independent with community mobility with device  PATIENT GOALS: pt would like to regain strength, get up from the ground, walk at work with cane not rollator  NEXT MD VISIT: May 7th Ortho  OBJECTIVE:  Note: Objective measures were completed at Evaluation unless otherwise noted.  DIAGNOSTIC FINDINGS:   CLINICAL DATA:  Knee pain, chronic, negative xray (Age >= 5y)   EXAM: MRI OF THE LEFT KNEE WITHOUT CONTRAST   TECHNIQUE: Multiplanar, multisequence MR imaging of the knee was performed. No intravenous contrast was administered.   COMPARISON:  X-ray 10/08/2022, MRI 12/15/2019   FINDINGS: Technical Note: Despite efforts by the technologist and patient, motion artifact is present  on today's exam and could not be eliminated. This reduces exam sensitivity and specificity.   MENISCI   Medial meniscus: Complex tearing of the medial meniscal body and posterior horn with radial component at the posterior horn.   Lateral meniscus: Complex tearing/maceration of the lateral meniscus.   LIGAMENTS   Cruciates: Chronically ACL deficient knee. Mucoid degeneration of the PCL.   Collaterals: Intact MCL. Lateral collateral ligament complex intact.   CARTILAGE   Patellofemoral:  Severe patellofemoral osteoarthritis.   Medial:  Severe medial compartment osteoarthritis.   Lateral:  Severe lateral compartment osteoarthritis.   MISCELLANEOUS   Joint:  No joint effusion. Fat pads within normal limits.   Popliteal Fossa:  No Baker's cyst. Intact popliteus tendon.   Extensor Mechanism:  Intact quadriceps and patellar tendons.   Bones: Tricompartmental joint space narrowing with bulky marginal osteophyte formation. Advanced subchondral cystic changes within the lateral tibial plateau and lateral femoral condyle. There is some chronic osseous remodeling  of the lateral tibial plateau. No fracture or dislocation.   Other: Nonspecific subcutaneous edema.   IMPRESSION: 1. Severe tricompartmental osteoarthritis of the left knee. 2. Complex tearing of the medial and lateral menisci.    PATIENT SURVEYS:  LEFS 28/80  COGNITION: Overall cognitive status: Within functional limits for tasks assessed     SENSATION: WFL  EDEMA:  symmetrical  POSTURE: rounded shoulders  PALPATION: Tenderness noted on joint line  LOWER EXTREMITY ROM:  Active ROM Right eval Left eval  Hip flexion    Hip extension    Hip abduction    Hip adduction    Hip internal rotation    Hip external rotation    Knee flexion 85 91  Knee extension 8 32 from 180  Ankle dorsiflexion    Ankle plantarflexion    Ankle inversion    Ankle eversion     (Blank rows = not tested)  LOWER  EXTREMITY MMT:   MMT Right eval Left eval  Hip flexion 3+ 3+  Hip extension    Hip abduction 4 4  Hip adduction 4 4  Hip internal rotation    Hip external rotation    Knee flexion 4- 4-  Knee extension 4- 4-  Ankle dorsiflexion 4+ 4+  Ankle plantarflexion    Ankle inversion    Ankle eversion     (Blank rows = not tested)  FUNCTIONAL TESTS:  30 seconds chair stand test 2 minute walk test: 150  5 reps for 30 second chair stand test GAIT: Distance walked: 150 Assistive device utilized: Single point cane Level of assistance: Modified independence Comments: decreased velocity, decreased stride length, AD used in R hand, decreased bilateral step length                                                                                                                                TREATMENT DATE:  06/25/2023  Evaluation: -ROM measured, Strength assessed, HEP prescribed, pt educated on prognosis, findings, and importance of HEP compliance if given.     PATIENT EDUCATION:  Education details: Pt was educated on findings of PT evaluation, prognosis, frequency of therapy visits and rationale, attendance policy, and HEP if given.   Person educated: Patient Education method: Explanation, Demonstration, and Handouts Education comprehension: verbalized understanding and needs further education  HOME EXERCISE PROGRAM: Access Code: [email or web address] URL: [email or web address] Date: 06/25/2023 Prepared by: Luz Lex  Exercises - Seated Long Arc Quad  - 1 x daily - 7 x weekly - 3 sets - 10 reps - Sit to Stand with Arms Crossed  - 1 x daily - 7 x weekly - 3 sets - 10 reps  ASSESSMENT:  CLINICAL IMPRESSION: Patient is a 68 y.o. female who was seen today for physical therapy evaluation and treatment for M25.562,G89.29 (ICD-10-CM) - Chronic pain of left knee M25.561,G89.29 (ICD-10-CM) - Chronic pain of right knee M17.0 (ICD-10-CM) - Bilateral primary osteoarthritis of knee R53.81  (ICD-10-CM) -  Physical deconditioning.   Patient demonstrates decreased LE strength, abnormal pain rating in bilateral knees, decreased ambulation endurance and quality, and impaired balance. Patient also demonstrates difficulty with ambulation during today's session with decreased stride length and velocity noted. Patient also demonstrates decreased bilateral knee ROM with deficits noted in both flexion and extension bilaterally. Patient requires increased education for it being okay to use knees and try to improve them to optimize functional mobility. Pt may also benefit from a knee brace on the left knee due to the instability she is having, recommend bariatric knee brace to ensure good circulation to distal LE. Patient would benefit from skilled physical therapy for increased endurance with ambulation, increased LE strength, increased bilateral knee ROM and balance for improved gait quality, return to higher level of function with ADLs, increased independence with gait at work and progress towards therapy goals.   OBJECTIVE IMPAIRMENTS: Abnormal gait, decreased activity tolerance, decreased balance, decreased endurance, decreased mobility, difficulty walking, decreased ROM, decreased strength, hypomobility, obesity, and pain.   ACTIVITY LIMITATIONS: carrying, lifting, bending, standing, squatting, stairs, transfers, bed mobility, continence, and locomotion level  PARTICIPATION LIMITATIONS: meal prep, cleaning, laundry, shopping, community activity, occupation, and yard work  PERSONAL FACTORS: Age, Fitness, Past/current experiences, and Time since onset of injury/illness/exacerbation are also affecting patient's functional outcome.   REHAB POTENTIAL: Fair chronic in nature  CLINICAL DECISION MAKING: Stable/uncomplicated  EVALUATION COMPLEXITY: Low   GOALS: Goals reviewed with patient? No  SHORT TERM GOALS: Target date: 07/16/23  Patient will demonstrate evidence of independence with  individualized HEP and will report compliance for at least 3 days per week for optimized progression towards remaining therapy goals. Baseline:  Goal status: INITIAL  2.  Patient will report a decrease in pain level during community ambulation by at least 2 points for improved quality of life. Baseline: 10/10 for left 6/10 for right Goal status: INITIAL     LONG TERM GOALS: Target date: 08/06/23  Pt will demonstrate a an increase of at least 9 points on the LEFS for improved performance of community ambulation and ADL. Baseline: see above Goal status: INITIAL  2.  Pt will improve 2 MWT by 140 feet in order to demonstrate improved functional ambulatory capacity in community setting.  Baseline: see above Goal status: INITIAL  3.  Pt will demonstrate WFL ROM (flexion and extension) in right knee, and WFL ROM for left knee for increased mobility and maximal efficiency of gait cycle during ambulation. Baseline: see above Goal status: INITIAL  4.  Pt will demonstrate at least 4-/5 MMT for bilateral lower extremities for increased strength during ADL and community ambulation. Baseline: see above Goal status: INITIAL      PLAN:  PT FREQUENCY: 2x/week  PT DURATION: 6 weeks  PLANNED INTERVENTIONS: 97110-Therapeutic exercises, 97530- Therapeutic activity, 97112- Neuromuscular re-education, 97535- Self Care, 95621- Manual therapy, [phone]- Gait training, Patient/Family education, Balance training, Stair training, Joint mobilization, Cryotherapy, and Moist heat  PLAN FOR NEXT SESSION: recommendation for knee brace, progress HEP, bilateral LE strengthening, endurance training, gait training with LRAD   Luz Lex, PT, DPT [facility] Office: [phone] 5:23 PM, 06/25/23

## 2023-07-02 ENCOUNTER — Ambulatory Visit (HOSPITAL_COMMUNITY)

## 2023-07-02 ENCOUNTER — Encounter (HOSPITAL_COMMUNITY): Payer: Self-pay

## 2023-07-02 ENCOUNTER — Ambulatory Visit: Payer: Self-pay | Admitting: Orthopaedic Surgery

## 2023-07-02 DIAGNOSIS — G8929 Other chronic pain: Secondary | ICD-10-CM

## 2023-07-02 DIAGNOSIS — R262 Difficulty in walking, not elsewhere classified: Secondary | ICD-10-CM

## 2023-07-02 DIAGNOSIS — M25562 Pain in left knee: Secondary | ICD-10-CM | POA: Diagnosis not present

## 2023-07-02 NOTE — Therapy (Signed)
 OUTPATIENT PHYSICAL THERAPY LOWER EXTREMITY TREATMENT   Patient Name: Whitney Bauer MRN: [id number] DOB:[birth year], 68 y.o., female Today's Date: 07/02/2023  END OF SESSION:  PT End of Session - 07/02/23 1517     Visit Number 2    Date for PT Re-Evaluation 08/06/23    Authorization Type [employer] PPO    Authorization Time Period no auth required, 30 visits max    PT Start Time 1518    Activity Tolerance Patient tolerated treatment well;Patient limited by fatigue;Patient limited by pain    Behavior During Therapy WFL for tasks assessed/performed              Past Medical History:  Diagnosis Date   Anemia    Arthritis    bilat. knees   Back pain    Back pain    Constipation    Edema of both lower extremities    Family history of adverse reaction to anesthesia    patients mother had cardiac issues during anesthesia   Food allergy    GERD (gastroesophageal reflux disease)    Hypertension    Joint pain    Joint pain    Lactose intolerance    Rheumatoid arthritis (HCC)    Swelling of both lower extremities    Vitamin D deficiency    Past Surgical History:  Procedure Laterality Date   BIOPSY  12/22/2019   Procedure: BIOPSY;  Surgeon: Malissa Hippo, MD;  [facility]: [facility];  Service: Endoscopy;;  antral   COLONOSCOPY N/A 01/06/2014   Procedure: COLONOSCOPY;  Surgeon: Malissa Hippo, MD;  [facility]: [facility];  Service: Endoscopy;  Laterality: N/A;  830   COLONOSCOPY WITH PROPOFOL N/A 12/22/2019   Procedure: COLONOSCOPY WITH PROPOFOL;  Surgeon: Malissa Hippo, MD;  [facility]: [facility];  Service: Endoscopy;  Laterality: N/A;  930   ESOPHAGOGASTRODUODENOSCOPY (EGD) WITH PROPOFOL N/A 12/22/2019   Procedure: ESOPHAGOGASTRODUODENOSCOPY (EGD) WITH PROPOFOL;  Surgeon: Malissa Hippo, MD;  Location: [facility];  Service: Endoscopy;  Laterality: N/A;   FOOT SURGERY     PARTIAL HYSTERECTOMY  jan 1999   Patient Active Problem List   Diagnosis Date Noted    Osteoarthritis of both knees 12/27/2021   Hypercholesterolemia 12/27/2021   Elevated BUN 12/27/2021   Elevated cholesterol 11/22/2021   Elevated glucose 11/22/2021   Eating disorder 11/22/2021   Spondylolisthesis at L3-L4 level 04/25/2021   Weakness of both arms 04/25/2021   Vitamin D deficiency 05/06/2018   Gastroesophageal reflux disease 04/13/2018   Essential hypertension 04/13/2018   Class 3 severe obesity with serious comorbidity and body mass index (BMI) of 50.0 to 59.9 in adult [facility]) 04/13/2018   Derangement of posterior horn of medial meniscus of right knee 09/10/2016   Chronic pain of right knee 09/10/2016   DOE (dyspnea on exertion) 01/28/2011   Muscle weakness (generalized) 10/23/2010    PCP: Carylon Perches, MD   REFERRING PROVIDER: Darreld Mclean, MD  REFERRING DIAG: [id number] (ICD-10-CM) - Chronic pain of left knee M25.561,G89.29 (ICD-10-CM) - Chronic pain of right knee M17.0 (ICD-10-CM) - Bilateral primary osteoarthritis of knee R53.81 (ICD-10-CM) - Physical deconditioning  THERAPY DIAG:  Chronic pain of left knee  Chronic pain of right knee  Difficulty in walking, not elsewhere classified  Rationale for Evaluation and Treatment: Rehabilitation  ONSET DATE: knee pain has been going on for 10 years  SUBJECTIVE:   SUBJECTIVE STATEMENT: Pt reports soreness in both knees but more in the left knee.   (Initial)  Pt reports chronic pain of bilateral knees, rating of 15/10  left knee, 6/10 right knee upon presentation. 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 Pt wants a recommendation for a knee brace for left knee. Pt states that Dr. Has scared her from doing anything involving knees other than was is absolutely necessary and recommended TKA. [place] and was not able to get up at the time and needed to be lifted up. Pt states she would just be happy to get 25% improvement so she can walk  better and wants to walk at work with the cane instead of needing the rollator.   PERTINENT HISTORY: -On weight loss shots, lost significant amount of weight -been a year since last steroid shots -PT about a year ago here probably for the knee (not much help) PAIN:  Are you having pain? Yes: NPRS scale: 10/10 left knee, 6/10 right knee Pain location: all over the front of left knee, front of right knee Pain description: left knee out of socket feeling, right stiffens up quick Aggravating factors: walking, standing, getting up from sitting down Relieving factors: sitting, advil, ice better than heat  PRECAUTIONS: Fall  RED FLAGS: Bowel or bladder incontinence: Yes: worse when laying down, increased urine void 4 times per hour, wears pad at night    WEIGHT BEARING RESTRICTIONS: No  FALLS:  Has patient fallen in last 6 months? Yes. Number of falls 1, [employer], Valentines day, [facility] picked her up    LIVING ENVIRONMENT: Lives with: lives with their family, nephew Lives in: House/apartment Stairs: Yes: External: 3 steps; on right going up Has following equipment at home: Single point cane and Environmental consultant - 4 wheeled  OCCUPATION: mail processing in [place]  PLOF: Independent with household mobility with device and Independent with community mobility with device  PATIENT GOALS: pt would like to regain strength, get up from the ground, walk at work with cane not rollator  NEXT MD VISIT: May 7th Ortho  OBJECTIVE:  Note: Objective measures were completed at Evaluation unless otherwise noted.  DIAGNOSTIC FINDINGS:   CLINICAL DATA:  Knee pain, chronic, negative xray (Age >= 5y)   EXAM: MRI OF THE LEFT KNEE WITHOUT CONTRAST   TECHNIQUE: Multiplanar, multisequence MR imaging of the knee was performed. No intravenous contrast was administered.   COMPARISON:  X-ray 10/08/2022, MRI 12/15/2019   FINDINGS: Technical Note: Despite efforts by the technologist and patient, motion  artifact is present on today's exam and could not be eliminated. This reduces exam sensitivity and specificity.   MENISCI   Medial meniscus: Complex tearing of the medial meniscal body and posterior horn with radial component at the posterior horn.   Lateral meniscus: Complex tearing/maceration of the lateral meniscus.   LIGAMENTS   Cruciates: Chronically ACL deficient knee. Mucoid degeneration of the PCL.   Collaterals: Intact MCL. Lateral collateral ligament complex intact.   CARTILAGE   Patellofemoral:  Severe patellofemoral osteoarthritis.   Medial:  Severe medial compartment osteoarthritis.   Lateral:  Severe lateral compartment osteoarthritis.   MISCELLANEOUS   Joint:  No joint effusion. Fat pads within normal limits.   Popliteal Fossa:  No Baker's cyst. Intact popliteus tendon.   Extensor Mechanism:  Intact quadriceps and patellar tendons.   Bones: Tricompartmental joint space narrowing with bulky marginal osteophyte formation. Advanced subchondral cystic changes within the lateral tibial plateau and lateral femoral condyle. There is some chronic osseous remodeling  of the lateral tibial plateau. No fracture or dislocation.   Other: Nonspecific subcutaneous edema.   IMPRESSION: 1. Severe tricompartmental osteoarthritis of the left knee. 2. Complex tearing of the medial and lateral menisci.    PATIENT SURVEYS:  LEFS 28/80  COGNITION: Overall cognitive status: Within functional limits for tasks assessed     SENSATION: WFL  EDEMA:  symmetrical  POSTURE: rounded shoulders  PALPATION: Tenderness noted on joint line  LOWER EXTREMITY ROM:  Active ROM Right eval Left eval  Hip flexion    Hip extension    Hip abduction    Hip adduction    Hip internal rotation    Hip external rotation    Knee flexion 85 91  Knee extension 8 32 from 180  Ankle dorsiflexion    Ankle plantarflexion    Ankle inversion    Ankle eversion     (Blank rows = not  tested)  LOWER EXTREMITY MMT:   MMT Right eval Left eval  Hip flexion 3+ 3+  Hip extension    Hip abduction 4 4  Hip adduction 4 4  Hip internal rotation    Hip external rotation    Knee flexion 4- 4-  Knee extension 4- 4-  Ankle dorsiflexion 4+ 4+  Ankle plantarflexion    Ankle inversion    Ankle eversion     (Blank rows = not tested)  FUNCTIONAL TESTS:  30 seconds chair stand test 2 minute walk test: 150  5 reps for 30 second chair stand test GAIT: Distance walked: 150 Assistive device utilized: Single point cane Level of assistance: Modified independence Comments: decreased velocity, decreased stride length, AD used in R hand, decreased bilateral step length                                                                                                                                TREATMENT DATE:  07/02/23 - Quad sets 15 reps w/ 5s hold - Hip adduction w/ ball 5s hold 20 reps - Hip abduction w/ RTB 20 reps w/ 5s hold - Attempt at bike (unable to complete) - Sit to stand w. Raised mat table and no UE assist  06/25/2023  Evaluation: -ROM measured, Strength assessed, HEP prescribed, pt educated on prognosis, findings, and importance of HEP compliance if given.     PATIENT EDUCATION:  Education details: Pt was educated on findings of PT evaluation, prognosis, frequency of therapy visits and rationale, attendance policy, and HEP if given.   Person educated: Patient Education method: Explanation, Demonstration, and Handouts Education comprehension: verbalized understanding and needs further education  HOME EXERCISE PROGRAM: Access Code: [email or web address] URL: [email or web address] Date: 06/25/2023 Prepared by: Luz Lex  Exercises - Seated Long Arc Quad  - 1 x daily - 7 x weekly - 3 sets - 10 reps - Sit to Stand with Arms Crossed  - 1 x daily - 7 x weekly - 3 sets - 10 reps  ASSESSMENT:  CLINICAL IMPRESSION: Pt reported increased pain initially  therefore emphasis on isometric activation of hip and generalized knee musculature. Educated on continued slow progression with strengthening interventions to address weakness and joint support. Patient would benefit from skilled physical therapy for increased endurance with ambulation, increased LE strength, increased bilateral knee ROM and balance for improved gait quality, return to higher level of function with ADLs, increased independence with gait at work and progress towards therapy goals.  (Initial) Patient is a 68 y.o. female who was seen today for physical therapy evaluation and treatment for M25.562,G89.29 (ICD-10-CM) - Chronic pain of left knee M25.561,G89.29 (ICD-10-CM) - Chronic pain of right knee M17.0 (ICD-10-CM) - Bilateral primary osteoarthritis of knee R53.81 (ICD-10-CM) - Physical deconditioning.   Patient demonstrates decreased LE strength, abnormal pain rating in bilateral knees, decreased ambulation endurance and quality, and impaired balance. Patient also demonstrates difficulty with ambulation during today's session with decreased stride length and velocity noted. Patient also demonstrates decreased bilateral knee ROM with deficits noted in both flexion and extension bilaterally. Patient requires increased education for it being okay to use knees and try to improve them to optimize functional mobility. Pt may also benefit from a knee brace on the left knee due to the instability she is having, recommend bariatric knee brace to ensure good circulation to distal LE. Patient would benefit from skilled physical therapy for increased endurance with ambulation, increased LE strength, increased bilateral knee ROM and balance for improved gait quality, return to higher level of function with ADLs, increased independence with gait at work and progress towards therapy goals.   OBJECTIVE IMPAIRMENTS: Abnormal gait, decreased activity tolerance, decreased balance, decreased endurance, decreased  mobility, difficulty walking, decreased ROM, decreased strength, hypomobility, obesity, and pain.   ACTIVITY LIMITATIONS: carrying, lifting, bending, standing, squatting, stairs, transfers, bed mobility, continence, and locomotion level  PARTICIPATION LIMITATIONS: meal prep, cleaning, laundry, shopping, community activity, occupation, and yard work  PERSONAL FACTORS: Age, Fitness, Past/current experiences, and Time since onset of injury/illness/exacerbation are also affecting patient's functional outcome.   REHAB POTENTIAL: Fair chronic in nature  CLINICAL DECISION MAKING: Stable/uncomplicated  EVALUATION COMPLEXITY: Low   GOALS: Goals reviewed with patient? No  SHORT TERM GOALS: Target date: 07/16/23  Patient will demonstrate evidence of independence with individualized HEP and will report compliance for at least 3 days per week for optimized progression towards remaining therapy goals. Baseline:  Goal status: INITIAL  2.  Patient will report a decrease in pain level during community ambulation by at least 2 points for improved quality of life. Baseline: 10/10 for left 6/10 for right Goal status: INITIAL     LONG TERM GOALS: Target date: 08/06/23  Pt will demonstrate a an increase of at least 9 points on the LEFS for improved performance of community ambulation and ADL. Baseline: see above Goal status: INITIAL  2.  Pt will improve 2 MWT by 140 feet in order to demonstrate improved functional ambulatory capacity in community setting.  Baseline: see above Goal status: INITIAL  3.  Pt will demonstrate WFL ROM (flexion and extension) in right knee, and WFL ROM for left knee for increased mobility and maximal efficiency of gait cycle during ambulation. Baseline: see above Goal status: INITIAL  4.  Pt will demonstrate at least 4-/5 MMT for bilateral lower extremities for increased strength during ADL and community ambulation. Baseline: see above Goal status:  INITIAL      PLAN:  PT FREQUENCY: 2x/week  PT DURATION: 6 weeks  PLANNED INTERVENTIONS: 97110-Therapeutic  exercises, 97530- Therapeutic activity, [id number]- Neuromuscular re-education, [phone]- Self Care, [id number]- Manual therapy, [phone]- Gait training, Patient/Family education, Balance training, Stair training, Joint mobilization, Cryotherapy, and Moist heat  PLAN FOR NEXT SESSION: recommendation for knee brace, progress HEP, bilateral LE strengthening, endurance training, gait training with LRAD  Placido Sou PT, DPT [facility] [place] [phone] office  3:36 PM, 07/02/23

## 2023-07-16 ENCOUNTER — Encounter (HOSPITAL_COMMUNITY): Admitting: Physical Therapy

## 2023-07-18 ENCOUNTER — Encounter (HOSPITAL_COMMUNITY): Payer: Self-pay

## 2023-07-18 ENCOUNTER — Ambulatory Visit (HOSPITAL_COMMUNITY)

## 2023-07-18 DIAGNOSIS — M25562 Pain in left knee: Secondary | ICD-10-CM | POA: Diagnosis not present

## 2023-07-18 DIAGNOSIS — R262 Difficulty in walking, not elsewhere classified: Secondary | ICD-10-CM

## 2023-07-18 DIAGNOSIS — G8929 Other chronic pain: Secondary | ICD-10-CM

## 2023-07-18 NOTE — Therapy (Signed)
 OUTPATIENT PHYSICAL THERAPY LOWER EXTREMITY TREATMENT   Patient Name: Whitney Bauer MRN: [id number] DOB:[birth year], 68 y.o., female Today's Date: 07/18/2023  END OF SESSION:  PT End of Session - 07/18/23 1234     Visit Number 3    Date for PT Re-Evaluation 08/06/23    Authorization Type [employer] PPO    Authorization Time Period no auth required, 30 visits max    PT Start Time 1100    PT Stop Time 1145    PT Time Calculation (min) 45 min    Activity Tolerance Patient tolerated treatment well;Patient limited by fatigue;Patient limited by pain    Behavior During Therapy WFL for tasks assessed/performed               Past Medical History:  Diagnosis Date   Anemia    Arthritis    bilat. knees   Back pain    Back pain    Constipation    Edema of both lower extremities    Family history of adverse reaction to anesthesia    patients mother had cardiac issues during anesthesia   Food allergy    GERD (gastroesophageal reflux disease)    Hypertension    Joint pain    Joint pain    Lactose intolerance    Rheumatoid arthritis (HCC)    Swelling of both lower extremities    Vitamin D  deficiency    Past Surgical History:  Procedure Laterality Date   BIOPSY  12/22/2019   Procedure: BIOPSY;  Surgeon: Ruby Corporal, MD;  [facility]: [facility];  Service: Endoscopy;;  antral   COLONOSCOPY N/A 01/06/2014   Procedure: COLONOSCOPY;  Surgeon: Ruby Corporal, MD;  Location: [facility];  Service: Endoscopy;  Laterality: N/A;  830   COLONOSCOPY WITH PROPOFOL  N/A 12/22/2019   Procedure: COLONOSCOPY WITH PROPOFOL ;  Surgeon: Ruby Corporal, MD;  Location: [facility];  Service: Endoscopy;  Laterality: N/A;  930   ESOPHAGOGASTRODUODENOSCOPY (EGD) WITH PROPOFOL  N/A 12/22/2019   Procedure: ESOPHAGOGASTRODUODENOSCOPY (EGD) WITH PROPOFOL ;  Surgeon: Ruby Corporal, MD;  Location: [facility];  Service: Endoscopy;  Laterality: N/A;   FOOT SURGERY     PARTIAL HYSTERECTOMY  jan 1999    Patient Active Problem List   Diagnosis Date Noted   Osteoarthritis of both knees 12/27/2021   Hypercholesterolemia 12/27/2021   Elevated BUN 12/27/2021   Elevated cholesterol 11/22/2021   Elevated glucose 11/22/2021   Eating disorder 11/22/2021   Spondylolisthesis at L3-L4 level 04/25/2021   Weakness of both arms 04/25/2021   Vitamin D  deficiency 05/06/2018   Gastroesophageal reflux disease 04/13/2018   Essential hypertension 04/13/2018   Class 3 severe obesity with serious comorbidity and body mass index (BMI) of 50.0 to 59.9 in adult [facility]) 04/13/2018   Derangement of posterior horn of medial meniscus of right knee 09/10/2016   Chronic pain of right knee 09/10/2016   DOE (dyspnea on exertion) 01/28/2011   Muscle weakness (generalized) 10/23/2010    PCP: Artemisa Bile, MD   REFERRING PROVIDER: Pleasant Brilliant, MD  REFERRING DIAG: [phone] (ICD-10-CM) - Chronic pain of left knee M25.561,G89.29 (ICD-10-CM) - Chronic pain of right knee M17.0 (ICD-10-CM) - Bilateral primary osteoarthritis of knee R53.81 (ICD-10-CM) - Physical deconditioning  THERAPY DIAG:  Chronic pain of left knee  Chronic pain of right knee  Difficulty in walking, not elsewhere classified  Rationale for Evaluation and Treatment: Rehabilitation  ONSET DATE: knee pain has been going on for 10 years  SUBJECTIVE:  SUBJECTIVE STATEMENT: Pt reports knees have been killing her. Pt reports the exercises have been hurting her but she has still been able work. Pain 10/10 today, took pain meds at 2 AM after snack. Works 3rd shift.  (Initial) Pt reports chronic pain of bilateral knees, rating of 15/10  left knee, 6/10 right knee upon presentation. 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 Pt wants a recommendation for a knee brace for left knee. Pt states that Dr. Has scared her from doing anything involving knees other than was is absolutely  necessary and recommended TKA. Pt [place] at [place] and was not able to get up at the time and needed to be lifted up. Pt states she would just be happy to get 25% improvement so she can walk better and wants to walk at work with the cane instead of needing the rollator.   PERTINENT HISTORY: -On weight loss shots, lost significant amount of weight -been a year since last steroid shots -PT about a year ago here probably for the knee (not much help) PAIN:  Are you having pain? Yes: NPRS scale: 10/10 left knee, 6/10 right knee Pain location: all over the front of left knee, front of right knee Pain description: left knee out of socket feeling, right stiffens up quick Aggravating factors: walking, standing, getting up from sitting down Relieving factors: sitting, advil , ice better than heat  PRECAUTIONS: Fall  RED FLAGS: Bowel or bladder incontinence: Yes: worse when laying down, increased urine void 4 times per hour, wears pad at night    WEIGHT BEARING RESTRICTIONS: No  FALLS:  Has patient fallen in last 6 months? Yes. Number of falls 1, [employer], Valentines day, [facility] up    LIVING ENVIRONMENT: Lives with: lives with their family, nephew Lives in: House/apartment Stairs: Yes: External: 3 steps; on right going up Has following equipment at home: Single point cane and Environmental consultant - 4 wheeled  OCCUPATION: mail processing in [place]  PLOF: Independent with household mobility with device and Independent with community mobility with device  PATIENT GOALS: pt would like to regain strength, get up from the ground, walk at work with cane not rollator  NEXT MD VISIT: May 7th Ortho  OBJECTIVE:  Note: Objective measures were completed at Evaluation unless otherwise noted.  DIAGNOSTIC FINDINGS:   CLINICAL DATA:  Knee pain, chronic, negative xray (Age >= 5y)   EXAM: MRI OF THE LEFT KNEE WITHOUT CONTRAST   TECHNIQUE: Multiplanar, multisequence MR imaging of the knee was  performed. No intravenous contrast was administered.   COMPARISON:  X-ray 10/08/2022, MRI 12/15/2019   FINDINGS: Technical Note: Despite efforts by the technologist and patient, motion artifact is present on today's exam and could not be eliminated. This reduces exam sensitivity and specificity.   MENISCI   Medial meniscus: Complex tearing of the medial meniscal body and posterior horn with radial component at the posterior horn.   Lateral meniscus: Complex tearing/maceration of the lateral meniscus.   LIGAMENTS   Cruciates: Chronically ACL deficient knee. Mucoid degeneration of the PCL.   Collaterals: Intact MCL. Lateral collateral ligament complex intact.   CARTILAGE   Patellofemoral:  Severe patellofemoral osteoarthritis.   Medial:  Severe medial compartment osteoarthritis.   Lateral:  Severe lateral compartment osteoarthritis.   MISCELLANEOUS   Joint:  No joint effusion. Fat pads within normal limits.   Popliteal Fossa:  No Baker's cyst. Intact popliteus tendon.  Extensor Mechanism:  Intact quadriceps and patellar tendons.   Bones: Tricompartmental joint space narrowing with bulky marginal osteophyte formation. Advanced subchondral cystic changes within the lateral tibial plateau and lateral femoral condyle. There is some chronic osseous remodeling of the lateral tibial plateau. No fracture or dislocation.   Other: Nonspecific subcutaneous edema.   IMPRESSION: 1. Severe tricompartmental osteoarthritis of the left knee. 2. Complex tearing of the medial and lateral menisci.    PATIENT SURVEYS:  LEFS 28/80  COGNITION: Overall cognitive status: Within functional limits for tasks assessed     SENSATION: WFL  EDEMA:  symmetrical  POSTURE: rounded shoulders  PALPATION: Tenderness noted on joint line  LOWER EXTREMITY ROM:  Active ROM Right eval Left eval  Hip flexion    Hip extension    Hip abduction    Hip adduction    Hip internal  rotation    Hip external rotation    Knee flexion 85 91  Knee extension 8 32 from 180  Ankle dorsiflexion    Ankle plantarflexion    Ankle inversion    Ankle eversion     (Blank rows = not tested)  LOWER EXTREMITY MMT:   MMT Right eval Left eval  Hip flexion 3+ 3+  Hip extension    Hip abduction 4 4  Hip adduction 4 4  Hip internal rotation    Hip external rotation    Knee flexion 4- 4-  Knee extension 4- 4-  Ankle dorsiflexion 4+ 4+  Ankle plantarflexion    Ankle inversion    Ankle eversion     (Blank rows = not tested)  FUNCTIONAL TESTS:  30 seconds chair stand test 2 minute walk test: 150  5 reps for 30 second chair stand test GAIT: Distance walked: 150 Assistive device utilized: Single point cane Level of assistance: Modified independence Comments: decreased velocity, decreased stride length, AD used in R hand, decreased bilateral step length                                                                                                                                TREATMENT DATE:  07/18/2023  Manual Therapy: -PROM of both knees, Flexion and extension -STM of left and right knee musculature  Therapeutic Exercise: -Supine bridges 2 sets of 10 reps, 3 second holds, symptomatic, pt cued for max hip extension -Seated glute sets, 1 set of 10 reps -Seated quad sets1 set of 10 reps -Lateral stepping along elevated mat table, 3 lap,  -Standing 3 way hip, 1 set of 8 reps each side, pt requires bilateral UE support (hurts WB on left leg)  Therapeutic Activity: -Sit to stands, 2 sets of 3 reps, throughout session  -Cold packs applied to bilateral knees in long sitting for 5 minutes at the end of therapy.   07/02/23 - Quad sets 15 reps w/ 5s hold - Hip adduction w/ ball 5s hold 20 reps - Hip abduction w/ RTB 20  reps w/ 5s hold - Attempt at bike (unable to complete) - Sit to stand w. Raised mat table and no UE assist  06/25/2023  Evaluation: -ROM measured,  Strength assessed, HEP prescribed, pt educated on prognosis, findings, and importance of HEP compliance if given.     PATIENT EDUCATION:  Education details: Pt was educated on findings of PT evaluation, prognosis, frequency of therapy visits and rationale, attendance policy, and HEP if given.   Person educated: Patient Education method: Explanation, Demonstration, and Handouts Education comprehension: verbalized understanding and needs further education  HOME EXERCISE PROGRAM: Access Code: [email or web address] URL: [email or web address] Date: 06/25/2023 Prepared by: Armond Bertin  Exercises - Seated Long Arc Quad  - 1 x daily - 7 x weekly - 3 sets - 10 reps - Sit to Stand with Arms Crossed  - 1 x daily - 7 x weekly - 3 sets - 10 reps  ASSESSMENT:  CLINICAL IMPRESSION: Patient continues to demonstrate bilateral knee pain with WB, decreased LE strength, decreased gait quality and balance. Patient also demonstrates pain with SLS of LLE during 3 way hip exercise. Patient able to push through pain and complete multiple WB exercises although pain rating 10/10 upon presentation. Pt responded somewhat well to manual therapy of R knee decreasing to 4/10 pain but L knee remained at 10/10 pain. Patient would continue to benefit from skilled physical therapy for decreased knee pain, increased endurance with ambulation, increased LE strength, and improved balance for improved quality of life, improved independence with gait training and continued progress towards therapy goals.   (Initial) Patient is a 68 y.o. female who was seen today for physical therapy evaluation and treatment for M25.562,G89.29 (ICD-10-CM) - Chronic pain of left knee M25.561,G89.29 (ICD-10-CM) - Chronic pain of right knee M17.0 (ICD-10-CM) - Bilateral primary osteoarthritis of knee R53.81 (ICD-10-CM) - Physical deconditioning.   Patient demonstrates decreased LE strength, abnormal pain rating in bilateral knees, decreased  ambulation endurance and quality, and impaired balance. Patient also demonstrates difficulty with ambulation during today's session with decreased stride length and velocity noted. Patient also demonstrates decreased bilateral knee ROM with deficits noted in both flexion and extension bilaterally. Patient requires increased education for it being okay to use knees and try to improve them to optimize functional mobility. Pt may also benefit from a knee brace on the left knee due to the instability she is having, recommend bariatric knee brace to ensure good circulation to distal LE. Patient would benefit from skilled physical therapy for increased endurance with ambulation, increased LE strength, increased bilateral knee ROM and balance for improved gait quality, return to higher level of function with ADLs, increased independence with gait at work and progress towards therapy goals.   OBJECTIVE IMPAIRMENTS: Abnormal gait, decreased activity tolerance, decreased balance, decreased endurance, decreased mobility, difficulty walking, decreased ROM, decreased strength, hypomobility, obesity, and pain.   ACTIVITY LIMITATIONS: carrying, lifting, bending, standing, squatting, stairs, transfers, bed mobility, continence, and locomotion level  PARTICIPATION LIMITATIONS: meal prep, cleaning, laundry, shopping, community activity, occupation, and yard work  PERSONAL FACTORS: Age, Fitness, Past/current experiences, and Time since onset of injury/illness/exacerbation are also affecting patient's functional outcome.   REHAB POTENTIAL: Fair chronic in nature  CLINICAL DECISION MAKING: Stable/uncomplicated  EVALUATION COMPLEXITY: Low   GOALS: Goals reviewed with patient? No  SHORT TERM GOALS: Target date: 07/16/23  Patient will demonstrate evidence of independence with individualized HEP and will report compliance for at least 3 days per week for optimized progression towards remaining therapy  goals. Baseline:  Goal status: INITIAL  2.  Patient will report a decrease in pain level during community ambulation by at least 2 points for improved quality of life. Baseline: 10/10 for left 6/10 for right Goal status: INITIAL     LONG TERM GOALS: Target date: 08/06/23  Pt will demonstrate a an increase of at least 9 points on the LEFS for improved performance of community ambulation and ADL. Baseline: see above Goal status: INITIAL  2.  Pt will improve 2 MWT by 140 feet in order to demonstrate improved functional ambulatory capacity in community setting.  Baseline: see above Goal status: INITIAL  3.  Pt will demonstrate WFL ROM (flexion and extension) in right knee, and WFL ROM for left knee for increased mobility and maximal efficiency of gait cycle during ambulation. Baseline: see above Goal status: INITIAL  4.  Pt will demonstrate at least 4-/5 MMT for bilateral lower extremities for increased strength during ADL and community ambulation. Baseline: see above Goal status: INITIAL      PLAN:  PT FREQUENCY: 2x/week  PT DURATION: 6 weeks  PLANNED INTERVENTIONS: 97110-Therapeutic exercises, 97530- Therapeutic activity, 97112- Neuromuscular re-education, 97535- Self Care, 16109- Manual therapy, [id number]- Gait training, Patient/Family education, Balance training, Stair training, Joint mobilization, Cryotherapy, and Moist heat  PLAN FOR NEXT SESSION: recommendation for knee brace, progress HEP, bilateral LE strengthening, endurance training, gait training with LRAD  Armond Bertin, PT, DPT [facility] Office: [phone] 12:38 PM, 07/18/23

## 2023-07-22 ENCOUNTER — Ambulatory Visit (HOSPITAL_COMMUNITY): Admitting: Physical Therapy

## 2023-07-22 DIAGNOSIS — M25562 Pain in left knee: Secondary | ICD-10-CM | POA: Diagnosis not present

## 2023-07-22 DIAGNOSIS — R262 Difficulty in walking, not elsewhere classified: Secondary | ICD-10-CM

## 2023-07-22 DIAGNOSIS — G8929 Other chronic pain: Secondary | ICD-10-CM

## 2023-07-22 NOTE — Therapy (Signed)
 OUTPATIENT PHYSICAL THERAPY LOWER EXTREMITY TREATMENT   Patient Name: Whitney Bauer MRN: [id number] DOB:October 06, 1955, 68 y.o., female Today's Date: 07/22/2023  END OF SESSION:  PT End of Session - 07/22/23 1431     Visit Number 4    Number of Visits 12    Date for PT Re-Evaluation 08/06/23    Authorization Type [employer] P[employer]    Authorization Time Period no auth required, 30 visits max    Authorization - Visit Number 4    Authorization - Number of Visits 30    Progress Note Due on Visit 10    PT Start Time 1431    PT Stop Time 1514    PT Time Calculation (min) 43 min    Activity Tolerance Patient tolerated treatment well;Patient limited by fatigue;Patient limited by pain    Behavior During Therapy WFL for tasks assessed/performed               Past Medical History:  Diagnosis Date   Anemia    Arthritis    bilat. knees   Back pain    Back pain    Constipation    Edema of both lower extremities    Family history of adverse reaction to anesthesia    patients mother had cardiac issues during anesthesia   Food allergy    GERD (gastroesophageal reflux disease)    Hypertension    Joint pain    Joint pain    Lactose intolerance    Rheumatoid arthritis (HCC)    Swelling of both lower extremities    Vitamin D  deficiency    Past Surgical History:  Procedure Laterality Date   BIOPSY  12/22/2019   Procedure: BIOPSY;  Surgeon: Ruby Corporal, MD;  [facility]: [facility];  Service: Endoscopy;;  antral   COLONOSCOPY N/A 01/06/2014   Procedure: COLONOSCOPY;  Surgeon: Ruby Corporal, MD;  [facility]: [facility];  Service: Endoscopy;  Laterality: N/A;  830   COLONOSCOPY WITH PROPOFOL  N/A 12/22/2019   Procedure: COLONOSCOPY WITH PROPOFOL ;  Surgeon: Ruby Corporal, MD;  Location: [facility];  Service: Endoscopy;  Laterality: N/A;  930   ESOPHAGOGASTRODUODENOSCOPY (EGD) WITH PROPOFOL  N/A 12/22/2019   Procedure: ESOPHAGOGASTRODUODENOSCOPY (EGD) WITH PROPOFOL ;  Surgeon:  Ruby Corporal, MD;  Location: [facility];  Service: Endoscopy;  Laterality: N/A;   FOOT SURGERY     PARTIAL HYSTERECTOMY  jan 1999   Patient Active Problem List   Diagnosis Date Noted   Osteoarthritis of both knees 12/27/2021   Hypercholesterolemia 12/27/2021   Elevated BUN 12/27/2021   Elevated cholesterol 11/22/2021   Elevated glucose 11/22/2021   Eating disorder 11/22/2021   Spondylolisthesis at L3-L4 level 04/25/2021   Weakness of both arms 04/25/2021   Vitamin D  deficiency 05/06/2018   Gastroesophageal reflux disease 04/13/2018   Essential hypertension 04/13/2018   Class 3 severe obesity with serious comorbidity and body mass index (BMI) of 50.0 to 59.9 in adult [facility]) 04/13/2018   Derangement of posterior horn of medial meniscus of right knee 09/10/2016   Chronic pain of right knee 09/10/2016   DOE (dyspnea on exertion) 01/28/2011   Muscle weakness (generalized) 10/23/2010    PCP: Artemisa Bile, MD   REFERRING PROVIDER: Pleasant Brilliant, MD  REFERRING DIAG: [phone] (ICD-10-CM) - Chronic pain of left knee M25.561,G89.29 (ICD-10-CM) - Chronic pain of right knee M17.0 (ICD-10-CM) - Bilateral primary osteoarthritis of knee R53.81 (ICD-10-CM) - Physical deconditioning  THERAPY DIAG:  Chronic pain of left knee  Chronic pain  of right knee  Difficulty in walking, not elsewhere classified  Rationale for Evaluation and Treatment: Rehabilitation  ONSET DATE: knee pain has been going on for 10 years  SUBJECTIVE:   SUBJECTIVE STATEMENT: Pt reports she wants to have the knee surgery but having to get her weight right first.  Currently bilateral knees are killing her. 7-8/10 today.  Goes into work today and works 3rd shift.  Request more seated exercises that she can do from her chair at work  (Initial) Pt reports chronic pain of bilateral knees, rating of 15/10  left knee, 6/10 right knee upon presentation. 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 Pt wants a recommendation for a knee brace for left knee. Pt states that Dr. Has scared her from doing anything involving knees other than was is absolutely necessary and recommended TKA. Pt fell at [place] and was not able to get up at the time and needed to be lifted up. Pt states she would just be happy to get 25% improvement so she can walk better and wants to walk at work with the cane instead of needing the rollator.   PERTINENT HISTORY: -On weight loss shots, lost significant amount of weight -been a year since last steroid shots -PT about a year ago here probably for the knee (not much help) PAIN:  Are you having pain? Yes: NPRS scale: 7/10 bil knees Pain location: all over the front of left knee, front of right knee Pain description: left knee out of socket feeling, right stiffens up quick Aggravating factors: walking, standing, getting up from sitting down Relieving factors: sitting, advil , ice better than heat  PRECAUTIONS: Fall  RED FLAGS: Bowel or bladder incontinence: Yes: worse when laying down, increased urine void 4 times per hour, wears pad at night    WEIGHT BEARING RESTRICTIONS: No  FALLS:  Has patient fallen in last 6 months? Yes. Number of falls 1, [employer], Valentines day, [facility] picked her up    LIVING ENVIRONMENT: Lives with: lives with their family, nephew Lives in: House/apartment Stairs: Yes: External: 3 steps; on right going up Has following equipment at home: Single point cane and Environmental consultant - 4 wheeled  OCCUPATION: mail processing in [place]  PLOF: Independent with household mobility with device and Independent with community mobility with device  PATIENT GOALS: pt would like to regain strength, get up from the ground, walk at work with cane not rollator  NEXT MD VISIT: May 7th Ortho  OBJECTIVE:  Note: Objective measures were completed at Evaluation unless otherwise noted.  DIAGNOSTIC FINDINGS:    CLINICAL DATA:  Knee pain, chronic, negative xray (Age >= 5y)   EXAM: MRI OF THE LEFT KNEE WITHOUT CONTRAST   TECHNIQUE: Multiplanar, multisequence MR imaging of the knee was performed. No intravenous contrast was administered.   COMPARISON:  X-ray 10/08/2022, MRI 12/15/2019   FINDINGS: Technical Note: Despite efforts by the technologist and patient, motion artifact is present on today's exam and could not be eliminated. This reduces exam sensitivity and specificity.   MENISCI   Medial meniscus: Complex tearing of the medial meniscal body and posterior horn with radial component at the posterior horn.   Lateral meniscus: Complex tearing/maceration of the lateral meniscus.   LIGAMENTS   Cruciates: Chronically ACL deficient knee. Mucoid degeneration of the PCL.   Collaterals: Intact MCL. Lateral collateral ligament complex intact.   CARTILAGE   Patellofemoral:  Severe patellofemoral osteoarthritis.  Medial:  Severe medial compartment osteoarthritis.   Lateral:  Severe lateral compartment osteoarthritis.   MISCELLANEOUS   Joint:  No joint effusion. Fat pads within normal limits.   Popliteal Fossa:  No Baker's cyst. Intact popliteus tendon.   Extensor Mechanism:  Intact quadriceps and patellar tendons.   Bones: Tricompartmental joint space narrowing with bulky marginal osteophyte formation. Advanced subchondral cystic changes within the lateral tibial plateau and lateral femoral condyle. There is some chronic osseous remodeling of the lateral tibial plateau. No fracture or dislocation.   Other: Nonspecific subcutaneous edema.   IMPRESSION: 1. Severe tricompartmental osteoarthritis of the left knee. 2. Complex tearing of the medial and lateral menisci.    PATIENT SURVEYS:  LEFS 28/80  COGNITION: Overall cognitive status: Within functional limits for tasks assessed     SENSATION: WFL  EDEMA:  symmetrical  POSTURE: rounded  shoulders  PALPATION: Tenderness noted on joint line  LOWER EXTREMITY ROM:  Active ROM Right eval Left eval  Hip flexion    Hip extension    Hip abduction    Hip adduction    Hip internal rotation    Hip external rotation    Knee flexion 85 91  Knee extension 8 32 from 180  Ankle dorsiflexion    Ankle plantarflexion    Ankle inversion    Ankle eversion     (Blank rows = not tested)  LOWER EXTREMITY MMT:   MMT Right eval Left eval  Hip flexion 3+ 3+  Hip extension    Hip abduction 4 4  Hip adduction 4 4  Hip internal rotation    Hip external rotation    Knee flexion 4- 4-  Knee extension 4- 4-  Ankle dorsiflexion 4+ 4+  Ankle plantarflexion    Ankle inversion    Ankle eversion     (Blank rows = not tested)  FUNCTIONAL TESTS:  30 seconds chair stand test 2 minute walk test: 150  5 reps for 30 second chair stand test GAIT: Distance walked: 150 Assistive device utilized: Single point cane Level of assistance: Modified independence Comments: decreased velocity, decreased stride length, AD used in R hand, decreased bilateral step length                                                                                                                                TREATMENT DATE:  07/22/23 Seated:  LAQ bilaterally 10X5" each  STS from elevated surface (told to add cushion at work) Music therapist sets/glut seats 10X each 5" holds  Heel/toe raises 10X  Marching 10X  Supine:  heelslides 10X each  SAQ 10X5" each  Bridge 2X10 each Attempted sit to stands elevated but too painful  07/18/2023  Manual Therapy: -PROM of both knees, Flexion and extension -STM of left and right knee musculature  Therapeutic Exercise: -Supine bridges 2 sets of 10 reps, 3 second holds, symptomatic, pt cued for max hip extension -Seated glute sets,  1 set of 10 reps -Seated quad sets1 set of 10 reps -Lateral stepping along elevated mat table, 3 lap,  -Standing 3 way hip, 1 set of 8 reps  each side, pt requires bilateral UE support (hurts WB on left leg)  Therapeutic Activity: -Sit to stands, 2 sets of 3 reps, throughout session  -Cold packs applied to bilateral knees in long sitting for 5 minutes at the end of therapy.   07/02/23 - Quad sets 15 reps w/ 5s hold - Hip adduction w/ ball 5s hold 20 reps - Hip abduction w/ RTB 20 reps w/ 5s hold - Attempt at bike (unable to complete) - Sit to stand w. Raised mat table and no UE assist  06/25/2023  Evaluation: -ROM measured, Strength assessed, HEP prescribed, pt educated on prognosis, findings, and importance of HEP compliance if given.     PATIENT EDUCATION:  Education details: Pt was educated on findings of PT evaluation, prognosis, frequency of therapy visits and rationale, attendance policy, and HEP if given.   Person educated: Patient Education method: Explanation, Demonstration, and Handouts Education comprehension: verbalized understanding and needs further education  HOME EXERCISE PROGRAM: Access Code: [email or web address] URL: [email or web address] Date: 06/25/2023 Prepared by: Armond Bertin  Exercises - Seated Long Arc Quad  - 1 x daily - 7 x weekly - 3 sets - 10 reps - Sit to Stand with Arms Crossed  - 1 x daily - 7 x weekly - 3 sets - 10 reps  ASSESSMENT:  CLINICAL IMPRESSION: Patient requesting more NWB exercises as she sits mostly at work and has increased pain with weight bearing.  Attempted sit to stands, however too painful this session and discontinued.  Pt voiced frustration as her knees conitnue to stay sore and with increased pain. Added seated march for hip flexor/posture and heel/toe raises.  Pt will continue to benefit from skilled physical therapy for decreased knee pain, increased endurance with ambulation, increased LE strength, and improved balance for improved quality of life, improved independence with gait training and continued progress towards therapy goals.   (Initial) Patient is a  68 y.o. female who was seen today for physical therapy evaluation and treatment for M25.562,G89.29 (ICD-10-CM) - Chronic pain of left knee M25.561,G89.29 (ICD-10-CM) - Chronic pain of right knee M17.0 (ICD-10-CM) - Bilateral primary osteoarthritis of knee R53.81 (ICD-10-CM) - Physical deconditioning.   Patient demonstrates decreased LE strength, abnormal pain rating in bilateral knees, decreased ambulation endurance and quality, and impaired balance. Patient also demonstrates difficulty with ambulation during today's session with decreased stride length and velocity noted. Patient also demonstrates decreased bilateral knee ROM with deficits noted in both flexion and extension bilaterally. Patient requires increased education for it being okay to use knees and try to improve them to optimize functional mobility. Pt may also benefit from a knee brace on the left knee due to the instability she is having, recommend bariatric knee brace to ensure good circulation to distal LE. Patient would benefit from skilled physical therapy for increased endurance with ambulation, increased LE strength, increased bilateral knee ROM and balance for improved gait quality, return to higher level of function with ADLs, increased independence with gait at work and progress towards therapy goals.   OBJECTIVE IMPAIRMENTS: Abnormal gait, decreased activity tolerance, decreased balance, decreased endurance, decreased mobility, difficulty walking, decreased ROM, decreased strength, hypomobility, obesity, and pain.   ACTIVITY LIMITATIONS: carrying, lifting, bending, standing, squatting, stairs, transfers, bed mobility, continence, and locomotion level  PARTICIPATION LIMITATIONS: meal prep, cleaning, laundry, shopping, community  activity, occupation, and yard work  PERSONAL FACTORS: Age, Fitness, Past/current experiences, and Time since onset of injury/illness/exacerbation are also affecting patient's functional outcome.   REHAB  POTENTIAL: Fair chronic in nature  CLINICAL DECISION MAKING: Stable/uncomplicated  EVALUATION COMPLEXITY: Low   GOALS: Goals reviewed with patient? No  SHORT TERM GOALS: Target date: 07/16/23  Patient will demonstrate evidence of independence with individualized HEP and will report compliance for at least 3 days per week for optimized progression towards remaining therapy goals. Baseline:  Goal status: INITIAL  2.  Patient will report a decrease in pain level during community ambulation by at least 2 points for improved quality of life. Baseline: 10/10 for left 6/10 for right Goal status: INITIAL     LONG TERM GOALS: Target date: 08/06/23  Pt will demonstrate a an increase of at least 9 points on the LEFS for improved performance of community ambulation and ADL. Baseline: see above Goal status: INITIAL  2.  Pt will improve 2 MWT by 140 feet in order to demonstrate improved functional ambulatory capacity in community setting.  Baseline: see above Goal status: INITIAL  3.  Pt will demonstrate WFL ROM (flexion and extension) in right knee, and WFL ROM for left knee for increased mobility and maximal efficiency of gait cycle during ambulation. Baseline: see above Goal status: INITIAL  4.  Pt will demonstrate at least 4-/5 MMT for bilateral lower extremities for increased strength during ADL and community ambulation. Baseline: see above Goal status: INITIAL      PLAN:  PT FREQUENCY: 2x/week  PT DURATION: 6 weeks  PLANNED INTERVENTIONS: 97110-Therapeutic exercises, 97530- Therapeutic activity, 97112- Neuromuscular re-education, 97535- Self Care, 16109- Manual therapy, [phone]- Gait training, Patient/Family education, Balance training, Stair training, Joint mobilization, Cryotherapy, and Moist heat  PLAN FOR NEXT SESSION: Progress HEP, bilateral LE strengthening, endurance training, gait training with LRAD  Lorenso Romance, PTA/CLT [facility]  Rehabilitation [facility] Ph: [phone] 2:32 PM, 07/22/23

## 2023-07-23 ENCOUNTER — Ambulatory Visit: Payer: Commercial Managed Care - PPO | Admitting: Orthopaedic Surgery

## 2023-07-24 ENCOUNTER — Encounter (HOSPITAL_COMMUNITY)

## 2023-07-29 ENCOUNTER — Ambulatory Visit (HOSPITAL_COMMUNITY): Attending: Orthopaedic Surgery | Admitting: Physical Therapy

## 2023-07-29 DIAGNOSIS — M25562 Pain in left knee: Secondary | ICD-10-CM | POA: Diagnosis present

## 2023-07-29 DIAGNOSIS — R262 Difficulty in walking, not elsewhere classified: Secondary | ICD-10-CM | POA: Diagnosis present

## 2023-07-29 DIAGNOSIS — M25561 Pain in right knee: Secondary | ICD-10-CM | POA: Insufficient documentation

## 2023-07-29 DIAGNOSIS — G8929 Other chronic pain: Secondary | ICD-10-CM | POA: Diagnosis present

## 2023-07-29 NOTE — Therapy (Signed)
 OUTPATIENT PHYSICAL THERAPY LOWER EXTREMITY TREATMENT   Patient Name: Whitney Bauer MRN: [id number] DOB:Sep 07, 1955, 68 y.o., female Today's Date: 07/29/2023  END OF SESSION:  PT End of Session - 07/29/23 1639     Visit Number 5    Number of Visits 12    Date for PT Re-Evaluation 08/06/23    Authorization Type [employer] PPO    Authorization Time Period no auth required, 30 visits max    Authorization - Number of Visits 30    Progress Note Due on Visit 10    PT Start Time 1436    PT Stop Time 1519    PT Time Calculation (min) 43 min    Activity Tolerance Patient tolerated treatment well;Patient limited by fatigue;Patient limited by pain    Behavior During Therapy WFL for tasks assessed/performed                Past Medical History:  Diagnosis Date   Anemia    Arthritis    bilat. knees   Back pain    Back pain    Constipation    Edema of both lower extremities    Family history of adverse reaction to anesthesia    patients mother had cardiac issues during anesthesia   Food allergy    GERD (gastroesophageal reflux disease)    Hypertension    Joint pain    Joint pain    Lactose intolerance    Rheumatoid arthritis (HCC)    Swelling of both lower extremities    Vitamin D  deficiency    Past Surgical History:  Procedure Laterality Date   BIOPSY  12/22/2019   Procedure: BIOPSY;  Surgeon: Ruby Corporal, MD;  [facility]: [facility];  Service: Endoscopy;;  antral   COLONOSCOPY N/A 01/06/2014   Procedure: COLONOSCOPY;  Surgeon: Ruby Corporal, MD;  Location: [facility];  Service: Endoscopy;  Laterality: N/A;  830   COLONOSCOPY WITH PROPOFOL  N/A 12/22/2019   Procedure: COLONOSCOPY WITH PROPOFOL ;  Surgeon: Ruby Corporal, MD;  Location: [facility];  Service: Endoscopy;  Laterality: N/A;  930   ESOPHAGOGASTRODUODENOSCOPY (EGD) WITH PROPOFOL  N/A 12/22/2019   Procedure: ESOPHAGOGASTRODUODENOSCOPY (EGD) WITH PROPOFOL ;  Surgeon: Ruby Corporal, MD;  [facility];  Service: Endoscopy;  Laterality: N/A;   FOOT SURGERY     PARTIAL HYSTERECTOMY  jan 1999   Patient Active Problem List   Diagnosis Date Noted   Osteoarthritis of both knees 12/27/2021   Hypercholesterolemia 12/27/2021   Elevated BUN 12/27/2021   Elevated cholesterol 11/22/2021   Elevated glucose 11/22/2021   Eating disorder 11/22/2021   Spondylolisthesis at L3-L4 level 04/25/2021   Weakness of both arms 04/25/2021   Vitamin D  deficiency 05/06/2018   Gastroesophageal reflux disease 04/13/2018   Essential hypertension 04/13/2018   Class 3 severe obesity with serious comorbidity and body mass index (BMI) of 50.0 to 59.9 in adult 04/13/2018   Derangement of posterior horn of medial meniscus of right knee 09/10/2016   Chronic pain of right knee 09/10/2016   DOE (dyspnea on exertion) 01/28/2011   Muscle weakness (generalized) 10/23/2010    PCP: Artemisa Bile, MD   REFERRING PROVIDER: Pleasant Brilliant, MD  REFERRING DIAG: [phone] (ICD-10-CM) - Chronic pain of left knee M25.561,G89.29 (ICD-10-CM) - Chronic pain of right knee M17.0 (ICD-10-CM) - Bilateral primary osteoarthritis of knee R53.81 (ICD-10-CM) - Physical deconditioning  THERAPY DIAG:  Chronic pain of left knee  Chronic pain of right knee  Difficulty in walking, not  elsewhere classified  Rationale for Evaluation and Treatment: Rehabilitation  ONSET DATE: knee pain has been going on for 10 years  SUBJECTIVE:   SUBJECTIVE STATEMENT: Pt states it feels a little better today.  5/10 pain rating. Goes into work today and works 3rd shift but it's the last day of her shift this week.  (Initial) Pt reports chronic pain of bilateral knees, rating of 15/10  left knee, 6/10 right knee upon presentation. 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 Pt wants a recommendation for a knee brace for left knee. Pt states that Dr. Has scared her from doing anything  involving knees other than was is absolutely necessary and recommended TKA. Pt [place] at [place] and was not able to get up at the time and needed to be lifted up. Pt states she would just be happy to get 25% improvement so she can walk better and wants to walk at work with the cane instead of needing the rollator.   PERTINENT HISTORY: -On weight loss shots, lost significant amount of weight -been a year since last steroid shots -PT about a year ago here probably for the knee (not much help) PAIN:  Are you having pain? Yes: NPRS scale: 7/10 bil knees Pain location: all over the front of left knee, front of right knee Pain description: left knee out of socket feeling, right stiffens up quick Aggravating factors: walking, standing, getting up from sitting down Relieving factors: sitting, advil , ice better than heat  PRECAUTIONS: Fall  RED FLAGS: Bowel or bladder incontinence: Yes: worse when laying down, increased urine void 4 times per hour, wears pad at night    WEIGHT BEARING RESTRICTIONS: No  FALLS:  Has patient fallen in last 6 months? Yes. Number of falls 1, [employer], Valentines day, [facility] picked her up    LIVING ENVIRONMENT: Lives with: lives with their family, nephew Lives in: House/apartment Stairs: Yes: External: 3 steps; on right going up Has following equipment at home: Single point cane and Environmental consultant - 4 wheeled  OCCUPATION: mail processing in [place]  PLOF: Independent with household mobility with device and Independent with community mobility with device  PATIENT GOALS: pt would like to regain strength, get up from the ground, walk at work with cane not rollator  NEXT MD VISIT: May 7th Ortho  OBJECTIVE:  Note: Objective measures were completed at Evaluation unless otherwise noted.  DIAGNOSTIC FINDINGS:   CLINICAL DATA:  Knee pain, chronic, negative xray (Age >= 5y)   EXAM: MRI OF THE LEFT KNEE WITHOUT CONTRAST   TECHNIQUE: Multiplanar, multisequence MR  imaging of the knee was performed. No intravenous contrast was administered.   COMPARISON:  X-ray 10/08/2022, MRI 12/15/2019   FINDINGS: Technical Note: Despite efforts by the technologist and patient, motion artifact is present on today's exam and could not be eliminated. This reduces exam sensitivity and specificity.   MENISCI   Medial meniscus: Complex tearing of the medial meniscal body and posterior horn with radial component at the posterior horn.   Lateral meniscus: Complex tearing/maceration of the lateral meniscus.   LIGAMENTS   Cruciates: Chronically ACL deficient knee. Mucoid degeneration of the PCL.   Collaterals: Intact MCL. Lateral collateral ligament complex intact.   CARTILAGE   Patellofemoral:  Severe patellofemoral osteoarthritis.   Medial:  Severe medial compartment osteoarthritis.   Lateral:  Severe lateral compartment osteoarthritis.   MISCELLANEOUS   Joint:  No joint effusion. Fat  pads within normal limits.   Popliteal Fossa:  No Baker's cyst. Intact popliteus tendon.   Extensor Mechanism:  Intact quadriceps and patellar tendons.   Bones: Tricompartmental joint space narrowing with bulky marginal osteophyte formation. Advanced subchondral cystic changes within the lateral tibial plateau and lateral femoral condyle. There is some chronic osseous remodeling of the lateral tibial plateau. No fracture or dislocation.   Other: Nonspecific subcutaneous edema.   IMPRESSION: 1. Severe tricompartmental osteoarthritis of the left knee. 2. Complex tearing of the medial and lateral menisci.    PATIENT SURVEYS:  LEFS 28/80  COGNITION: Overall cognitive status: Within functional limits for tasks assessed     SENSATION: WFL  EDEMA:  symmetrical  POSTURE: rounded shoulders  PALPATION: Tenderness noted on joint line  LOWER EXTREMITY ROM:  Active ROM Right eval Left eval  Hip flexion    Hip extension    Hip abduction    Hip adduction     Hip internal rotation    Hip external rotation    Knee flexion 85 91  Knee extension 8 32 from 180  Ankle dorsiflexion    Ankle plantarflexion    Ankle inversion    Ankle eversion     (Blank rows = not tested)  LOWER EXTREMITY MMT:   MMT Right eval Left eval  Hip flexion 3+ 3+  Hip extension    Hip abduction 4 4  Hip adduction 4 4  Hip internal rotation    Hip external rotation    Knee flexion 4- 4-  Knee extension 4- 4-  Ankle dorsiflexion 4+ 4+  Ankle plantarflexion    Ankle inversion    Ankle eversion     (Blank rows = not tested)  FUNCTIONAL TESTS:  30 seconds chair stand test 2 minute walk test: 150  5 reps for 30 second chair stand test GAIT: Distance walked: 150 Assistive device utilized: Single point cane Level of assistance: Modified independence Comments: decreased velocity, decreased stride length, AD used in R hand, decreased bilateral step length                                                                                                                                TREATMENT DATE:  07/29/23 STanding:  heel raises 10X  Toe raise 10X  Marching alternating 10X  Hip abduction 2X10 each  Hip extension 2X10 each   Knee flexion 20X each Sit to stand with foam riser, UE assist 10X Seated:  LAQ 2X10 each  07/22/23 Seated:  LAQ bilaterally 10X5" each  STS from elevated surface (told to add cushion at work) Music therapist sets/glut seats 10X each 5" holds  Heel/toe raises 10X  Marching 10X  Supine:  heelslides 10X each  SAQ 10X5" each  Bridge 2X10 each Attempted sit to stands elevated but too painful  07/18/2023  Manual Therapy: -PROM of both knees, Flexion and extension -STM of left and right knee musculature  Therapeutic Exercise: -  Supine bridges 2 sets of 10 reps, 3 second holds, symptomatic, pt cued for max hip extension -Seated glute sets, 1 set of 10 reps -Seated quad sets1 set of 10 reps -Lateral stepping along elevated mat table, 3  lap,  -Standing 3 way hip, 1 set of 8 reps each side, pt requires bilateral UE support (hurts WB on left leg)  Therapeutic Activity: -Sit to stands, 2 sets of 3 reps, throughout session  -Cold packs applied to bilateral knees in long sitting for 5 minutes at the end of therapy.   07/02/23 - Quad sets 15 reps w/ 5s hold - Hip adduction w/ ball 5s hold 20 reps - Hip abduction w/ RTB 20 reps w/ 5s hold - Attempt at bike (unable to complete) - Sit to stand w. Raised mat table and no UE assist  06/25/2023  Evaluation: -ROM measured, Strength assessed, HEP prescribed, pt educated on prognosis, findings, and importance of HEP compliance if given.     PATIENT EDUCATION:  Education details: Pt was educated on findings of PT evaluation, prognosis, frequency of therapy visits and rationale, attendance policy, and HEP if given.   Person educated: Patient Education method: Explanation, Demonstration, and Handouts Education comprehension: verbalized understanding and needs further education  HOME EXERCISE PROGRAM: Access Code: [email or web address] URL: [email or web address] Date: 06/25/2023 Prepared by: Armond Bertin  Exercises - Seated Long Arc Quad  - 1 x daily - 7 x weekly - 3 sets - 10 reps - Sit to Stand with Arms Crossed  - 1 x daily - 7 x weekly - 3 sets - 10 reps  ASSESSMENT:  CLINICAL IMPRESSION: Progressed this session to standing exercises as her symptoms have decreased.  Pt reports of working this past week but sits mostly at work.  Sit to stand continues to be most painful for pt with tendency to bend far forward when standing, even with use of UE and elevated seat height.  Pt able to complete all other exercises without c/o pain and minimal seated rest breaks.  Pt will continue to benefit from skilled physical therapy for decreased knee pain, increased endurance with ambulation, increased LE strength, and improved balance for improved quality of life, improved independence with  gait training and continued progress towards therapy goals.  (Initial) Patient is a 68 y.o. female who was seen today for physical therapy evaluation and treatment for M25.562,G89.29 (ICD-10-CM) - Chronic pain of left knee M25.561,G89.29 (ICD-10-CM) - Chronic pain of right knee M17.0 (ICD-10-CM) - Bilateral primary osteoarthritis of knee R53.81 (ICD-10-CM) - Physical deconditioning.   Patient demonstrates decreased LE strength, abnormal pain rating in bilateral knees, decreased ambulation endurance and quality, and impaired balance. Patient also demonstrates difficulty with ambulation during today's session with decreased stride length and velocity noted. Patient also demonstrates decreased bilateral knee ROM with deficits noted in both flexion and extension bilaterally. Patient requires increased education for it being okay to use knees and try to improve them to optimize functional mobility. Pt may also benefit from a knee brace on the left knee due to the instability she is having, recommend bariatric knee brace to ensure good circulation to distal LE. Patient would benefit from skilled physical therapy for increased endurance with ambulation, increased LE strength, increased bilateral knee ROM and balance for improved gait quality, return to higher level of function with ADLs, increased independence with gait at work and progress towards therapy goals.   OBJECTIVE IMPAIRMENTS: Abnormal gait, decreased activity tolerance, decreased balance, decreased endurance, decreased mobility, difficulty walking, decreased  ROM, decreased strength, hypomobility, obesity, and pain.   ACTIVITY LIMITATIONS: carrying, lifting, bending, standing, squatting, stairs, transfers, bed mobility, continence, and locomotion level  PARTICIPATION LIMITATIONS: meal prep, cleaning, laundry, shopping, community activity, occupation, and yard work  PERSONAL FACTORS: Age, Fitness, Past/current experiences, and Time since onset of  injury/illness/exacerbation are also affecting patient's functional outcome.   REHAB POTENTIAL: Fair chronic in nature  CLINICAL DECISION MAKING: Stable/uncomplicated  EVALUATION COMPLEXITY: Low   GOALS: Goals reviewed with patient? No  SHORT TERM GOALS: Target date: 07/16/23  Patient will demonstrate evidence of independence with individualized HEP and will report compliance for at least 3 days per week for optimized progression towards remaining therapy goals. Baseline:  Goal status: INITIAL  2.  Patient will report a decrease in pain level during community ambulation by at least 2 points for improved quality of life. Baseline: 10/10 for left 6/10 for right Goal status: INITIAL     LONG TERM GOALS: Target date: 08/06/23  Pt will demonstrate a an increase of at least 9 points on the LEFS for improved performance of community ambulation and ADL. Baseline: see above Goal status: INITIAL  2.  Pt will improve 2 MWT by 140 feet in order to demonstrate improved functional ambulatory capacity in community setting.  Baseline: see above Goal status: INITIAL  3.  Pt will demonstrate WFL ROM (flexion and extension) in right knee, and WFL ROM for left knee for increased mobility and maximal efficiency of gait cycle during ambulation. Baseline: see above Goal status: INITIAL  4.  Pt will demonstrate at least 4-/5 MMT for bilateral lower extremities for increased strength during ADL and community ambulation. Baseline: see above Goal status: INITIAL      PLAN:  PT FREQUENCY: 2x/week  PT DURATION: 6 weeks  PLANNED INTERVENTIONS: 97110-Therapeutic exercises, 97530- Therapeutic activity, 97112- Neuromuscular re-education, 97535- Self Care, 63875- Manual therapy, [phone]- Gait training, Patient/Family education, Balance training, Stair training, Joint mobilization, Cryotherapy, and Moist heat  PLAN FOR NEXT SESSION: Progress HEP, bilateral LE strengthening, endurance training, gait  training with LRAD  Lorenso Romance, PTA/CLT [facility] Ph: [phone] 4:40 PM, 07/29/23

## 2023-07-30 ENCOUNTER — Encounter: Payer: Self-pay | Admitting: Orthopaedic Surgery

## 2023-07-30 ENCOUNTER — Ambulatory Visit: Admitting: Orthopaedic Surgery

## 2023-07-30 VITALS — BP 123/86 | HR 77 | Ht 62.0 in | Wt 237.0 lb

## 2023-07-30 DIAGNOSIS — Z6841 Body Mass Index (BMI) 40.0 and over, adult: Secondary | ICD-10-CM

## 2023-07-30 DIAGNOSIS — R5381 Other malaise: Secondary | ICD-10-CM

## 2023-07-30 DIAGNOSIS — M170 Bilateral primary osteoarthritis of knee: Secondary | ICD-10-CM | POA: Diagnosis not present

## 2023-07-30 DIAGNOSIS — G8929 Other chronic pain: Secondary | ICD-10-CM

## 2023-07-30 NOTE — Progress Notes (Signed)
 Therapy is hard work.  She is going to PT and is slowly improving.  She is working hard there but it takes a while to build up her strength.  She has no new trauma.  Both knees are tender, have crepitus, effusion, ROM 0 to 90 with pain.  NV intact.  Encounter Diagnoses  Name Primary?   Chronic pain of left knee Yes   Chronic pain of right knee    Bilateral primary osteoarthritis of knee    Physical deconditioning    Morbid obesity (HCC)    Body mass index 40.0-44.9, adult (HCC)    Continue PT.    Continue present medicines.  Return in two months.  Call if any problem.  Precautions discussed.  Electronically Signed Pleasant Brilliant, MD 5/7/20253:08 PM

## 2023-07-31 ENCOUNTER — Ambulatory Visit (HOSPITAL_COMMUNITY): Admitting: Physical Therapy

## 2023-07-31 DIAGNOSIS — R262 Difficulty in walking, not elsewhere classified: Secondary | ICD-10-CM

## 2023-07-31 DIAGNOSIS — M25562 Pain in left knee: Secondary | ICD-10-CM | POA: Diagnosis not present

## 2023-07-31 DIAGNOSIS — G8929 Other chronic pain: Secondary | ICD-10-CM

## 2023-07-31 NOTE — Therapy (Signed)
 OUTPATIENT PHYSICAL THERAPY LOWER EXTREMITY TREATMENT   Patient Name: Whitney Bauer MRN: [id number] DOB:12-09-1955, 68 y.o., female Today's Date: 07/31/2023  END OF SESSION:  PT End of Session - 07/31/23 1616     Visit Number 6    Number of Visits 12    Date for PT Re-Evaluation 08/06/23    Authorization Type [employer] P[employer]    Authorization Time Period no auth required, 30 visits max    Authorization - Number of Visits 30    Progress Note Due on Visit 10    PT Start Time 1515    PT Stop Time 1600    PT Time Calculation (min) 45 min    Activity Tolerance Patient tolerated treatment well;Patient limited by fatigue;Patient limited by pain    Behavior During Therapy WFL for tasks assessed/performed                 Past Medical History:  Diagnosis Date   Anemia    Arthritis    bilat. knees   Back pain    Back pain    Constipation    Edema of both lower extremities    Family history of adverse reaction to anesthesia    patients mother had cardiac issues during anesthesia   Food allergy    GERD (gastroesophageal reflux disease)    Hypertension    Joint pain    Joint pain    Lactose intolerance    Rheumatoid arthritis (HCC)    Swelling of both lower extremities    Vitamin D  deficiency    Past Surgical History:  Procedure Laterality Date   BIOPSY  12/22/2019   Procedure: BIOPSY;  Surgeon: Ruby Corporal, MD;  Location: [facility];  Service: Endoscopy;;  antral   COLONOSCOPY N/A 01/06/2014   Procedure: COLONOSCOPY;  Surgeon: Ruby Corporal, MD;  Location: [facility];  Service: Endoscopy;  Laterality: N/A;  830   COLONOSCOPY WITH PROPOFOL  N/A 12/22/2019   Procedure: COLONOSCOPY WITH PROPOFOL ;  Surgeon: Ruby Corporal, MD;  Location: [facility];  Service: Endoscopy;  Laterality: N/A;  930   ESOPHAGOGASTRODUODENOSCOPY (EGD) WITH PROPOFOL  N/A 12/22/2019   Procedure: ESOPHAGOGASTRODUODENOSCOPY (EGD) WITH PROPOFOL ;  Surgeon: Ruby Corporal, MD;  Location: [facility];  Service: Endoscopy;  Laterality: N/A;   FOOT SURGERY     PARTIAL HYSTERECTOMY  jan 1999   Patient Active Problem List   Diagnosis Date Noted   Osteoarthritis of both knees 12/27/2021   Hypercholesterolemia 12/27/2021   Elevated BUN 12/27/2021   Elevated cholesterol 11/22/2021   Elevated glucose 11/22/2021   Eating disorder 11/22/2021   Spondylolisthesis at L3-L4 level 04/25/2021   Weakness of both arms 04/25/2021   Vitamin D  deficiency 05/06/2018   Gastroesophageal reflux disease 04/13/2018   Essential hypertension 04/13/2018   Class 3 severe obesity with serious comorbidity and body mass index (BMI) of 50.0 to 59.9 in adult 04/13/2018   Derangement of posterior horn of medial meniscus of right knee 09/10/2016   Chronic pain of right knee 09/10/2016   DOE (dyspnea on exertion) 01/28/2011   Muscle weakness (generalized) 10/23/2010    PCP: Artemisa Bile, MD   REFERRING PROVIDER: Pleasant Brilliant, MD  REFERRING DIAG: [phone] (ICD-10-CM) - Chronic pain of left knee M25.561,G89.29 (ICD-10-CM) - Chronic pain of right knee M17.0 (ICD-10-CM) - Bilateral primary osteoarthritis of knee R53.81 (ICD-10-CM) - Physical deconditioning  THERAPY DIAG:  Chronic pain of left knee  Chronic pain of right knee  Difficulty in walking,  not elsewhere classified  Rationale for Evaluation and Treatment: Rehabilitation  ONSET DATE: knee pain has been going on for 10 years  SUBJECTIVE:   SUBJECTIVE STATEMENT: Pt states it feels a little better today.  5/10 pain rating. Goes into work today and works 3rd shift but it's the last day of her shift this week.  (Initial) Pt reports chronic pain of bilateral knees, rating of 15/10  left knee, 6/10 right knee upon presentation. 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 Pt wants a recommendation for a knee brace for left knee. Pt states that Dr. Has scared her from doing  anything involving knees other than was is absolutely necessary and recommended TKA. [place] at [place] and was not able to get up at the time and needed to be lifted up. Pt states she would just be happy to get 25% improvement so she can walk better and wants to walk at work with the cane instead of needing the rollator.   PERTINENT HISTORY: -On weight loss shots, lost significant amount of weight -been a year since last steroid shots -PT about a year ago here probably for the knee (not much help) PAIN:  Are you having pain? Yes: NPRS scale: 7/10 bil knees Pain location: all over the front of left knee, front of right knee Pain description: left knee out of socket feeling, right stiffens up quick Aggravating factors: walking, standing, getting up from sitting down Relieving factors: sitting, advil , ice better than heat  PRECAUTIONS: Fall  RED FLAGS: Bowel or bladder incontinence: Yes: worse when laying down, increased urine void 4 times per hour, wears pad at night   WEIGHT BEARING RESTRICTIONS: No  FALLS:  Has patient fallen in last 6 months? Yes. Number of falls 1, [employer], Valentines day, [facility] picked her up    LIVING ENVIRONMENT: Lives with: lives with their family, nephew Lives in: House/apartment Stairs: Yes: External: 3 steps; on right going up Has following equipment at home: Single point cane and Environmental consultant - 4 wheeled  OCCUPATION: mail processing in [place]  PLOF: Independent with household mobility with device and Independent with community mobility with device  PATIENT GOALS: pt would like to regain strength, get up from the ground, walk at work with cane not rollator  NEXT MD VISIT: May 7th Ortho  OBJECTIVE:  Note: Objective measures were completed at Evaluation unless otherwise noted.  DIAGNOSTIC FINDINGS:   CLINICAL DATA:  Knee pain, chronic, negative xray (Age >= 5y)   EXAM: MRI OF THE LEFT KNEE WITHOUT CONTRAST   TECHNIQUE: Multiplanar,  multisequence MR imaging of the knee was performed. No intravenous contrast was administered.   COMPARISON:  X-ray 10/08/2022, MRI 12/15/2019   FINDINGS: Technical Note: Despite efforts by the technologist and patient, motion artifact is present on today's exam and could not be eliminated. This reduces exam sensitivity and specificity.   MENISCI   Medial meniscus: Complex tearing of the medial meniscal body and posterior horn with radial component at the posterior horn.   Lateral meniscus: Complex tearing/maceration of the lateral meniscus.   LIGAMENTS   Cruciates: Chronically ACL deficient knee. Mucoid degeneration of the PCL.   Collaterals: Intact MCL. Lateral collateral ligament complex intact.   CARTILAGE   Patellofemoral:  Severe patellofemoral osteoarthritis.   Medial:  Severe medial compartment osteoarthritis.   Lateral:  Severe lateral compartment osteoarthritis.   MISCELLANEOUS   Joint:  No joint effusion. Fat  pads within normal limits.   Popliteal Fossa:  No Baker's cyst. Intact popliteus tendon.   Extensor Mechanism:  Intact quadriceps and patellar tendons.   Bones: Tricompartmental joint space narrowing with bulky marginal osteophyte formation. Advanced subchondral cystic changes within the lateral tibial plateau and lateral femoral condyle. There is some chronic osseous remodeling of the lateral tibial plateau. No fracture or dislocation.   Other: Nonspecific subcutaneous edema.   IMPRESSION: 1. Severe tricompartmental osteoarthritis of the left knee. 2. Complex tearing of the medial and lateral menisci.    PATIENT SURVEYS:  LEFS 28/80  COGNITION: Overall cognitive status: Within functional limits for tasks assessed     SENSATION: WFL  EDEMA:  symmetrical  POSTURE: rounded shoulders  PALPATION: Tenderness noted on joint line  LOWER EXTREMITY ROM:  Active ROM Right eval Left eval  Hip flexion    Hip extension    Hip abduction     Hip adduction    Hip internal rotation    Hip external rotation    Knee flexion 85 91  Knee extension 8 32 from 180  Ankle dorsiflexion    Ankle plantarflexion    Ankle inversion    Ankle eversion     (Blank rows = not tested)  LOWER EXTREMITY MMT:   MMT Right eval Left eval  Hip flexion 3+ 3+  Hip extension    Hip abduction 4 4  Hip adduction 4 4  Hip internal rotation    Hip external rotation    Knee flexion 4- 4-  Knee extension 4- 4-  Ankle dorsiflexion 4+ 4+  Ankle plantarflexion    Ankle inversion    Ankle eversion     (Blank rows = not tested)  FUNCTIONAL TESTS:  30 seconds chair stand test 2 minute walk test: 150  5 reps for 30 second chair stand test GAIT: Distance walked: 150 Assistive device utilized: Single point cane Level of assistance: Modified independence Comments: decreased velocity, decreased stride length, AD used in R hand, decreased bilateral step length                                                                                                                                TREATMENT DATE:  07/31/23 STanding:  heel raises 20X  Toe raise 20X  Marching alternating 20X  Hip abduction 2X10 each with RTB  Hip extension 2X10 each  with RTB  Lateral step ups 2" step 10X each with bil UE assist  Forward step ups 4" 10X each with bil UE assist  Forward lunges onto 4" step with 1 UE assist 2X10 each Sit to stand with foam riser, UE assist 10X  07/29/23 STanding:  heel raises 10X  Toe raise 10X  Marching alternating 10X  Hip abduction 2X10 each  Hip extension 2X10 each   Knee flexion 20X each Sit to stand with foam riser, UE assist 10X Seated:  LAQ 2X10 each  07/22/23 Seated:  LAQ  bilaterally 10X5" each  STS from elevated surface (told to add cushion at work) Music therapist sets/glut seats 10X each 5" holds  Heel/toe raises 10X  Marching 10X  Supine:  heelslides 10X each  SAQ 10X5" each  Bridge 2X10 each Attempted sit to stands elevated  but too painful    PATIENT EDUCATION:  Education details: Pt was educated on findings of PT evaluation, prognosis, frequency of therapy visits and rationale, attendance policy, and HEP if given.   Person educated: Patient Education method: Explanation, Demonstration, and Handouts Education comprehension: verbalized understanding and needs further education  HOME EXERCISE PROGRAM: Access Code: [email or web address] URL: [email or web address] Date: 06/25/2023 Prepared by: Armond Bertin  Exercises - Seated Long Arc Quad  - 1 x daily - 7 x weekly - 3 sets - 10 reps - Sit to Stand with Arms Crossed  - 1 x daily - 7 x weekly - 3 sets - 10 reps  ASSESSMENT:  CLINICAL IMPRESSION: Continued with standing strength and stabilization exercises.  Added red theraband to hip abduction/extension exercise and began forward lunge onto 4" step to work on stability.  Pt with some complaints of Lt knee pain mid treatment that progressed towards end of session.  Pt still able to complete all exercises, however did not want to begin any new stabilization activities towards end of session as knee was "worn out" .  Pt was able to complete forward step ups with 4" height but unable to complete laterals with same height.  2" was max she could complete without pain.  Pt required adequate rest breaks throughout session.  Sit to stand continues to be most challenging.  Pt will continue to benefit from skilled physical therapy for decreased knee pain, increased endurance with ambulation, increased LE strength, and improved balance for improved quality of life, improved independence with gait training and continued progress towards therapy goals.  (Initial) Patient is a 68 y.o. female who was seen today for physical therapy evaluation and treatment for M25.562,G89.29 (ICD-10-CM) - Chronic pain of left knee M25.561,G89.29 (ICD-10-CM) - Chronic pain of right knee M17.0 (ICD-10-CM) - Bilateral primary osteoarthritis of knee  R53.81 (ICD-10-CM) - Physical deconditioning.   Patient demonstrates decreased LE strength, abnormal pain rating in bilateral knees, decreased ambulation endurance and quality, and impaired balance. Patient also demonstrates difficulty with ambulation during today's session with decreased stride length and velocity noted. Patient also demonstrates decreased bilateral knee ROM with deficits noted in both flexion and extension bilaterally. Patient requires increased education for it being okay to use knees and try to improve them to optimize functional mobility. Pt may also benefit from a knee brace on the left knee due to the instability she is having, recommend bariatric knee brace to ensure good circulation to distal LE. Patient would benefit from skilled physical therapy for increased endurance with ambulation, increased LE strength, increased bilateral knee ROM and balance for improved gait quality, return to higher level of function with ADLs, increased independence with gait at work and progress towards therapy goals.   OBJECTIVE IMPAIRMENTS: Abnormal gait, decreased activity tolerance, decreased balance, decreased endurance, decreased mobility, difficulty walking, decreased ROM, decreased strength, hypomobility, obesity, and pain.   ACTIVITY LIMITATIONS: carrying, lifting, bending, standing, squatting, stairs, transfers, bed mobility, continence, and locomotion level  PARTICIPATION LIMITATIONS: meal prep, cleaning, laundry, shopping, community activity, occupation, and yard work  PERSONAL FACTORS: Age, Fitness, Past/current experiences, and Time since onset of injury/illness/exacerbation are also affecting patient's functional outcome.   REHAB POTENTIAL: Fair chronic in  nature  CLINICAL DECISION MAKING: Stable/uncomplicated  EVALUATION COMPLEXITY: Low   GOALS: Goals reviewed with patient? No  SHORT TERM GOALS: Target date: 07/16/23  Patient will demonstrate evidence of independence  with individualized HEP and will report compliance for at least 3 days per week for optimized progression towards remaining therapy goals. Baseline:  Goal status: INITIAL  2.  Patient will report a decrease in pain level during community ambulation by at least 2 points for improved quality of life. Baseline: 10/10 for left 6/10 for right Goal status: INITIAL     LONG TERM GOALS: Target date: 08/06/23  Pt will demonstrate a an increase of at least 9 points on the LEFS for improved performance of community ambulation and ADL. Baseline: see above Goal status: INITIAL  2.  Pt will improve 2 MWT by 140 feet in order to demonstrate improved functional ambulatory capacity in community setting.  Baseline: see above Goal status: INITIAL  3.  Pt will demonstrate WFL ROM (flexion and extension) in right knee, and WFL ROM for left knee for increased mobility and maximal efficiency of gait cycle during ambulation. Baseline: see above Goal status: INITIAL  4.  Pt will demonstrate at least 4-/5 MMT for bilateral lower extremities for increased strength during ADL and community ambulation. Baseline: see above Goal status: INITIAL      PLAN:  PT FREQUENCY: 2x/week  PT DURATION: 6 weeks  PLANNED INTERVENTIONS: 97110-Therapeutic exercises, 97530- Therapeutic activity, [id number]- Neuromuscular re-education, 97535- Self Care, 29562- Manual therapy, [phone]- Gait training, Patient/Family education, Balance training, Stair training, Joint mobilization, Cryotherapy, and Moist heat  PLAN FOR NEXT SESSION: Progress HEP, bilateral LE strengthening, endurance training, gait training with LRAD.  Begin tandem stance next session.  Lorenso Romance, PTA/CLT [facility] Ph: [phone] 4:17 PM, 07/31/23

## 2023-08-05 ENCOUNTER — Encounter (HOSPITAL_COMMUNITY)

## 2023-08-07 ENCOUNTER — Ambulatory Visit (HOSPITAL_COMMUNITY): Admitting: Physical Therapy

## 2023-08-07 DIAGNOSIS — M25562 Pain in left knee: Secondary | ICD-10-CM | POA: Diagnosis not present

## 2023-08-07 DIAGNOSIS — R262 Difficulty in walking, not elsewhere classified: Secondary | ICD-10-CM

## 2023-08-07 DIAGNOSIS — G8929 Other chronic pain: Secondary | ICD-10-CM

## 2023-08-07 NOTE — Therapy (Signed)
 OUTPATIENT PHYSICAL THERAPY LOWER EXTREMITY TREATMENT Progress Note Reporting Period 06/25/2023 to 08/07/2023  See note below for Objective Data and Assessment of Progress/Goals.      Patient Name: Whitney Bauer MRN: [id number] DOB:[birth year], 68 y.o., female Today's Date: 08/07/2023  END OF SESSION:  PT End of Session - 08/07/23 1358     Visit Number 7    Number of Visits 12    Date for PT Re-Evaluation 09/18/23    Authorization Type [employer] P[employer]    Authorization Time Period no auth required, 30 visits max    Authorization - Number of Visits 30    Progress Note Due on Visit 10    PT Start Time 1350    PT Stop Time 1429    PT Time Calculation (min) 39 min    Activity Tolerance Patient tolerated treatment well;Patient limited by fatigue;Patient limited by pain    Behavior During Therapy WFL for tasks assessed/performed                  Past Medical History:  Diagnosis Date   Anemia    Arthritis    bilat. knees   Back pain    Back pain    Constipation    Edema of both lower extremities    Family history of adverse reaction to anesthesia    patients mother had cardiac issues during anesthesia   Food allergy    GERD (gastroesophageal reflux disease)    Hypertension    Joint pain    Joint pain    Lactose intolerance    Rheumatoid arthritis (HCC)    Swelling of both lower extremities    Vitamin D  deficiency    Past Surgical History:  Procedure Laterality Date   BIOPSY  12/22/2019   Procedure: BIOPSY;  Surgeon: Ruby Corporal, MD;  Location: [facility];  Service: Endoscopy;;  antral   COLONOSCOPY N/A 01/06/2014   Procedure: COLONOSCOPY;  Surgeon: Ruby Corporal, MD;  Location: [facility];  Service: Endoscopy;  Laterality: N/A;  830   COLONOSCOPY WITH PROPOFOL  N/A 12/22/2019   Procedure: COLONOSCOPY WITH PROPOFOL ;  Surgeon: Ruby Corporal, MD;  Location: [facility];  Service: Endoscopy;  Laterality: N/A;  930   ESOPHAGOGASTRODUODENOSCOPY (EGD)  WITH PROPOFOL  N/A 12/22/2019   Procedure: ESOPHAGOGASTRODUODENOSCOPY (EGD) WITH PROPOFOL ;  Surgeon: Ruby Corporal, MD;  Location: [facility];  Service: Endoscopy;  Laterality: N/A;   FOOT SURGERY     PARTIAL HYSTERECTOMY  jan 1999   Patient Active Problem List   Diagnosis Date Noted   Osteoarthritis of both knees 12/27/2021   Hypercholesterolemia 12/27/2021   Elevated BUN 12/27/2021   Elevated cholesterol 11/22/2021   Elevated glucose 11/22/2021   Eating disorder 11/22/2021   Spondylolisthesis at L3-L4 level 04/25/2021   Weakness of both arms 04/25/2021   Vitamin D  deficiency 05/06/2018   Gastroesophageal reflux disease 04/13/2018   Essential hypertension 04/13/2018   Class 3 severe obesity with serious comorbidity and body mass index (BMI) of 50.0 to 59.9 in adult 04/13/2018   Derangement of posterior horn of medial meniscus of right knee 09/10/2016   Chronic pain of right knee 09/10/2016   DOE (dyspnea on exertion) 01/28/2011   Muscle weakness (generalized) 10/23/2010    PCP: Artemisa Bile, MD   REFERRING PROVIDER: Pleasant Brilliant, MD  REFERRING DIAG: [phone] (ICD-10-CM) - Chronic pain of left knee M25.561,G89.29 (ICD-10-CM) - Chronic pain of right knee M17.0 (ICD-10-CM) - Bilateral primary osteoarthritis of knee R53.81 (ICD-10-CM) -  Physical deconditioning  THERAPY DIAG:  Chronic pain of left knee  Difficulty in walking, not elsewhere classified  Rationale for Evaluation and Treatment: Rehabilitation  ONSET DATE: knee pain has been going on for 10 years  SUBJECTIVE:   SUBJECTIVE STATEMENT: Pt states it's slowly getting better, today only has stiffness and no real pain but usually varies and gets as high as 8/10.  Still has to use her hands to stand but sometimes catches herself walking around her house without her AD and her endurance has improved overall.  Feels she is 20% better.    (Initial) Pt reports chronic pain of bilateral knees, rating of 15/10  left  knee, 6/10 right knee upon presentation. 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 Pt wants a recommendation for a knee brace for left knee. Pt states that Dr. Has scared her from doing anything involving knees other than was is absolutely necessary and recommended TKA. Pt fell at [place] and was not able to get up at the time and needed to be lifted up. Pt states she would just be happy to get 25% improvement so she can walk better and wants to walk at work with the cane instead of needing the rollator.   PERTINENT HISTORY: -On weight loss shots, lost significant amount of weight -been a year since last steroid shots -PT about a year ago here probably for the knee (not much help) PAIN:  Are you having pain? No  PRECAUTIONS: Fall  RED FLAGS: Bowel or bladder incontinence: Yes: worse when laying down, increased urine void 4 times per hour, wears pad at night   WEIGHT BEARING RESTRICTIONS: No  FALLS:  Has patient fallen in last 6 months? Yes. Number of falls 1, [employer], Valentines day, [facility] her up    LIVING ENVIRONMENT: Lives with: lives with their family, nephew Lives in: House/apartment Stairs: Yes: External: 3 steps; on right going up Has following equipment at home: Single point cane and Environmental consultant - 4 wheeled  OCCUPATION: mail processing in [place]  PLOF: Independent with household mobility with device and Independent with community mobility with device  PATIENT GOALS: pt would like to regain strength, get up from the ground, walk at work with cane not rollator  NEXT MD VISIT: May 7th Ortho  OBJECTIVE:  Note: Objective measures were completed at Evaluation unless otherwise noted.  DIAGNOSTIC FINDINGS:   CLINICAL DATA:  Knee pain, chronic, negative xray (Age >= 5y)   EXAM: MRI OF THE LEFT KNEE WITHOUT CONTRAST   TECHNIQUE: Multiplanar, multisequence MR imaging of the knee was performed.  No intravenous contrast was administered.   COMPARISON:  X-ray 10/08/2022, MRI 12/15/2019   FINDINGS: Technical Note: Despite efforts by the technologist and patient, motion artifact is present on today's exam and could not be eliminated. This reduces exam sensitivity and specificity.   MENISCI   Medial meniscus: Complex tearing of the medial meniscal body and posterior horn with radial component at the posterior horn.   Lateral meniscus: Complex tearing/maceration of the lateral meniscus.   LIGAMENTS   Cruciates: Chronically ACL deficient knee. Mucoid degeneration of the PCL.   Collaterals: Intact MCL. Lateral collateral ligament complex intact.   CARTILAGE   Patellofemoral:  Severe patellofemoral osteoarthritis.   Medial:  Severe medial compartment osteoarthritis.   Lateral:  Severe lateral compartment osteoarthritis.   MISCELLANEOUS   Joint:  No joint effusion. Fat pads within normal limits.  Popliteal Fossa:  No Baker's cyst. Intact popliteus tendon.   Extensor Mechanism:  Intact quadriceps and patellar tendons.   Bones: Tricompartmental joint space narrowing with bulky marginal osteophyte formation. Advanced subchondral cystic changes within the lateral tibial plateau and lateral femoral condyle. There is some chronic osseous remodeling of the lateral tibial plateau. No fracture or dislocation.   Other: Nonspecific subcutaneous edema.   IMPRESSION: 1. Severe tricompartmental osteoarthritis of the left knee. 2. Complex tearing of the medial and lateral menisci.    PATIENT SURVEYS:  LEFS 08/07/23:  33/80=41.3% ( was 28/80=35%)  COGNITION: Overall cognitive status: Within functional limits for tasks assessed     SENSATION: WFL  EDEMA:  symmetrical  POSTURE: rounded shoulders  PALPATION: Tenderness noted on joint line  LOWER EXTREMITY ROM:  Active ROM Right eval Left eval Right 08/07/23 Left 08/07/23  Hip flexion      Hip extension       Hip abduction      Hip adduction      Hip internal rotation      Hip external rotation      Knee flexion 85 91 85 90  Knee extension 8 32 from 180 8 -25  Ankle dorsiflexion      Ankle plantarflexion      Ankle inversion      Ankle eversion       (Blank rows = not tested)  LOWER EXTREMITY MMT:   MMT Right eval Left eval Right 08/07/23 Left 08/07/23  Hip flexion 3+ 3+ 4- 3+  Hip extension      Hip abduction 4 4 3- in sidelying 3- in sidelying  Hip adduction 4 4 3- in sidelying 3- in sidelying  Hip internal rotation      Hip external rotation      Knee flexion 4- 4- 4 4  Knee extension 4- 4- 4 4  Ankle dorsiflexion 4+ 4+    Ankle plantarflexion      Ankle inversion      Ankle eversion       (Blank rows = not tested)  FUNCTIONAL TESTS:  08/07/2023: 30 second STS test= 7X with UE assist as 5X)  2 minute walk test:  210 feet with SPC (was 150 feet)  Evaluation:30 seconds chair stand test 2 minute walk test: 150  5 reps for 30 second chair stand test  GAIT: Distance walked: 150 Assistive device utilized: Single point cane Level of assistance: Modified independence Comments: decreased velocity, decreased stride length, AD used in R hand, decreased bilateral step length                                                                                                                                TREATMENT DATE:  08/07/23 Standing:  Heel and toe raises 20X  Marching alternating high holds 20X  Hip abd with RTB 2X10  Hip extension RTB 2X10 Progress Note:   Functional test measures:  30  second STS test= 7X with UE assist as 5X) 2 minute walk test:  210 feet with SPC (was 150 feet) LEFS 08/07/23:  33/80=41.3% ( was 28/80=35%)   07/31/23 STanding:  heel raises 20X  Toe raise 20X  Marching alternating 20X  Hip abduction 2X10 each with RTB  Hip extension 2X10 each  with RTB  Lateral step ups 2" step 10X each with bil UE assist  Forward step ups 4" 10X each with bil UE  assist  Forward lunges onto 4" step with 1 UE assist 2X10 each Sit to stand with foam riser, UE assist 10X  07/29/23 STanding:  heel raises 10X  Toe raise 10X  Marching alternating 10X  Hip abduction 2X10 each  Hip extension 2X10 each   Knee flexion 20X each Sit to stand with foam riser, UE assist 10X Seated:  LAQ 2X10 each    PATIENT EDUCATION:  Education details: Pt was educated on findings of PT evaluation, prognosis, frequency of therapy visits and rationale, attendance policy, and HEP if given.   Person educated: Patient Education method: Explanation, Demonstration, and Handouts Education comprehension: verbalized understanding and needs further education  HOME EXERCISE PROGRAM: Access Code: [email or web address] URL: [email or web address] Date: 06/25/2023 Prepared by: Armond Bertin  Exercises - Seated Long Arc Quad  - 1 x daily - 7 x weekly - 3 sets - 10 reps - Sit to Stand with Arms Crossed  - 1 x daily - 7 x weekly - 3 sets - 10 reps  ASSESSMENT:  CLINICAL IMPRESSION: Progress note completed today. Pt has made good functional gains and feels she is at least 20% improved. Pt has met both short term goals and is progressing towards long term goals. Pt will continue to benefit from skilled therapy to further improve LE strength/stability as well as functional mobility.  Strength and bil knee ROM continues to be limited. Pt will need an updated HEP including LE strengthening in NWB she can work on in bed.   (Initial) Patient is a 68 y.o. female who was seen today for physical therapy evaluation and treatment for M25.562,G89.29 (ICD-10-CM) - Chronic pain of left knee M25.561,G89.29 (ICD-10-CM) - Chronic pain of right knee M17.0 (ICD-10-CM) - Bilateral primary osteoarthritis of knee R53.81 (ICD-10-CM) - Physical deconditioning.   Patient demonstrates decreased LE strength, abnormal pain rating in bilateral knees, decreased ambulation endurance and quality, and impaired balance.  Patient also demonstrates difficulty with ambulation during today's session with decreased stride length and velocity noted. Patient also demonstrates decreased bilateral knee ROM with deficits noted in both flexion and extension bilaterally. Patient requires increased education for it being okay to use knees and try to improve them to optimize functional mobility. Pt may also benefit from a knee brace on the left knee due to the instability she is having, recommend bariatric knee brace to ensure good circulation to distal LE. Patient would benefit from skilled physical therapy for increased endurance with ambulation, increased LE strength, increased bilateral knee ROM and balance for improved gait quality, return to higher level of function with ADLs, increased independence with gait at work and progress towards therapy goals.   OBJECTIVE IMPAIRMENTS: Abnormal gait, decreased activity tolerance, decreased balance, decreased endurance, decreased mobility, difficulty walking, decreased ROM, decreased strength, hypomobility, obesity, and pain.   ACTIVITY LIMITATIONS: carrying, lifting, bending, standing, squatting, stairs, transfers, bed mobility, continence, and locomotion level  PARTICIPATION LIMITATIONS: meal prep, cleaning, laundry, shopping, community activity, occupation, and yard work  PERSONAL FACTORS: Age, Fitness, Past/current experiences, and Time  since onset of injury/illness/exacerbation are also affecting patient's functional outcome.   REHAB POTENTIAL: Fair chronic in nature  CLINICAL DECISION MAKING: Stable/uncomplicated  EVALUATION COMPLEXITY: Low   GOALS: Goals reviewed with patient? No  SHORT TERM GOALS: Target date: 07/16/23  Patient will demonstrate evidence of independence with individualized HEP and will report compliance for at least 3 days per week for optimized progression towards remaining therapy goals. Baseline:  Goal status: MET  2.  Patient will report a  decrease in pain level during community ambulation by at least 2 points for improved quality of life. Baseline: 10/10 for left 6/10 for right Goal status: MET     LONG TERM GOALS: Target date: 08/06/23  Pt will demonstrate a an increase of at least 9 points on the LEFS for improved performance of community ambulation and ADL. Baseline: see above Goal status: IN PROGRESS (improved 5 points)  2.  Pt will improve 2 MWT by 140 feet in order to demonstrate improved functional ambulatory capacity in community setting.  Baseline: see above Goal status: IN PROGRESS (60 more feet)  3.  Pt will demonstrate WFL ROM (flexion and extension) in right knee, and WFL ROM for left knee for increased mobility and maximal efficiency of gait cycle during ambulation. Baseline: see above Goal status: IN PROGRESS  4.  Pt will demonstrate at least 4-/5 MMT for bilateral lower extremities for increased strength during ADL and community ambulation. Baseline: see above Goal status: IN PROGRESS      PLAN:  PT FREQUENCY: 2x/week  PT DURATION: 6 weeks  PLANNED INTERVENTIONS: 97110-Therapeutic exercises, 97530- Therapeutic activity, [id number]- Neuromuscular re-education, 97535- Self Care, [id number]- Manual therapy, [phone]- Gait training, Patient/Family education, Balance training, Stair training, Joint mobilization, Cryotherapy, and Moist heat  PLAN FOR NEXT SESSION: Progress HEP, bilateral LE strengthening, endurance training, gait training with LRAD.  Begin tandem stance next session. Update HEP to include supine/sidelying exercises to add next session. Request to continue therapy X 6 more weeks.    Lorenso Romance, PTA/CLT [facility] Ph: [phone] 4:27 PM, 08/07/23  Seeking additional 6 weeks, 2 visits per week.  Armond Bertin, PT, DPT [facility] Office: [phone] 11:19 AM, 08/08/23

## 2023-08-08 NOTE — Addendum Note (Signed)
 Addended by: Gelila Well on: 08/08/2023 11:21 AM   Modules accepted: Orders

## 2023-08-12 ENCOUNTER — Ambulatory Visit (HOSPITAL_COMMUNITY): Admitting: Physical Therapy

## 2023-08-12 DIAGNOSIS — M25562 Pain in left knee: Secondary | ICD-10-CM | POA: Diagnosis not present

## 2023-08-12 DIAGNOSIS — R262 Difficulty in walking, not elsewhere classified: Secondary | ICD-10-CM

## 2023-08-12 DIAGNOSIS — G8929 Other chronic pain: Secondary | ICD-10-CM

## 2023-08-12 NOTE — Therapy (Signed)
 OUTPATIENT PHYSICAL THERAPY LOWER EXTREMITY TREATMENT    Patient Name: Whitney Bauer MRN: [id number] DOB:[birth year], 68 y.o., female Today's Date: 08/12/2023  END OF SESSION:  PT End of Session - 08/12/23 1436     Visit Number 8    Number of Visits 12    Date for PT Re-Evaluation 09/18/23    Authorization Type [employer] P[employer]    Authorization Time Period no auth required, 30 visits max    Authorization - Visit Number 8    Authorization - Number of Visits 30    Progress Note Due on Visit 10    PT Start Time 1434    PT Stop Time 1515    PT Time Calculation (min) 41 min    Activity Tolerance Patient tolerated treatment well;Patient limited by fatigue;Patient limited by pain    Behavior During Therapy WFL for tasks assessed/performed                  Past Medical History:  Diagnosis Date   Anemia    Arthritis    bilat. knees   Back pain    Back pain    Constipation    Edema of both lower extremities    Family history of adverse reaction to anesthesia    patients mother had cardiac issues during anesthesia   Food allergy    GERD (gastroesophageal reflux disease)    Hypertension    Joint pain    Joint pain    Lactose intolerance    Rheumatoid arthritis (HCC)    Swelling of both lower extremities    Vitamin D  deficiency    Past Surgical History:  Procedure Laterality Date   BIOPSY  12/22/2019   Procedure: BIOPSY;  Surgeon: Ruby Corporal, MD;  [facility]: [facility];  Service: Endoscopy;;  antral   COLONOSCOPY N/A 01/06/2014   Procedure: COLONOSCOPY;  Surgeon: Ruby Corporal, MD;  Location: [facility];  Service: Endoscopy;  Laterality: N/A;  830   COLONOSCOPY WITH PROPOFOL  N/A 12/22/2019   Procedure: COLONOSCOPY WITH PROPOFOL ;  Surgeon: Ruby Corporal, MD;  Location: [facility];  Service: Endoscopy;  Laterality: N/A;  930   ESOPHAGOGASTRODUODENOSCOPY (EGD) WITH PROPOFOL  N/A 12/22/2019   Procedure: ESOPHAGOGASTRODUODENOSCOPY (EGD) WITH PROPOFOL ;  Surgeon:  Ruby Corporal, MD;  Location: [facility];  Service: Endoscopy;  Laterality: N/A;   FOOT SURGERY     PARTIAL HYSTERECTOMY  jan 1999   Patient Active Problem List   Diagnosis Date Noted   Osteoarthritis of both knees 12/27/2021   Hypercholesterolemia 12/27/2021   Elevated BUN 12/27/2021   Elevated cholesterol 11/22/2021   Elevated glucose 11/22/2021   Eating disorder 11/22/2021   Spondylolisthesis at L3-L4 level 04/25/2021   Weakness of both arms 04/25/2021   Vitamin D  deficiency 05/06/2018   Gastroesophageal reflux disease 04/13/2018   Essential hypertension 04/13/2018   Class 3 severe obesity with serious comorbidity and body mass index (BMI) of 50.0 to 59.9 in adult 04/13/2018   Derangement of posterior horn of medial meniscus of right knee 09/10/2016   Chronic pain of right knee 09/10/2016   DOE (dyspnea on exertion) 01/28/2011   Muscle weakness (generalized) 10/23/2010    PCP: Artemisa Bile, MD   REFERRING PROVIDER: Pleasant Brilliant, MD  REFERRING DIAG: [phone] (ICD-10-CM) - Chronic pain of left knee M25.561,G89.29 (ICD-10-CM) - Chronic pain of right knee M17.0 (ICD-10-CM) - Bilateral primary osteoarthritis of knee R53.81 (ICD-10-CM) - Physical deconditioning  THERAPY DIAG:  Chronic pain of left knee  Difficulty in walking, not elsewhere classified  Chronic pain of right knee  Rationale for Evaluation and Treatment: Rehabilitation  ONSET DATE: knee pain has been going on for 10 years  SUBJECTIVE:   SUBJECTIVE STATEMENT: Pt reports she worked in a dept she normally doesn't work and was up on her feet a lot at work. Reports increased soreness today in her knees.      (Initial) Pt reports chronic pain of bilateral knees, rating of 15/10  left knee, 6/10 right knee upon presentation. 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 Pt wants a recommendation for a knee brace for left knee. Pt states that  Dr. Has scared her from doing anything involving knees other than was is absolutely necessary and recommended TKA. Pt fell at [place] and was not able to get up at the time and needed to be lifted up. Pt states she would just be happy to get 25% improvement so she can walk better and wants to walk at work with the cane instead of needing the rollator.   PERTINENT HISTORY: -On weight loss shots, lost significant amount of weight -been a year since last steroid shots -PT about a year ago here probably for the knee (not much help) PAIN:  Are you having pain? No  PRECAUTIONS: Fall  RED FLAGS: Bowel or bladder incontinence: Yes: worse when laying down, increased urine void 4 times per hour, wears pad at night   WEIGHT BEARING RESTRICTIONS: No  FALLS:  Has patient fallen in last 6 months? Yes. Number of falls 1, [employer], Valentines day, [facility] picked her up    LIVING ENVIRONMENT: Lives with: lives with their family, nephew Lives in: House/apartment Stairs: Yes: External: 3 steps; on right going up Has following equipment at home: Single point cane and Environmental consultant - 4 wheeled  OCCUPATION: mail processing in [place]  PLOF: Independent with household mobility with device and Independent with community mobility with device  PATIENT GOALS: pt would like to regain strength, get up from the ground, walk at work with cane not rollator  NEXT MD VISIT: May 7th Ortho  OBJECTIVE:  Note: Objective measures were completed at Evaluation unless otherwise noted.  DIAGNOSTIC FINDINGS:   CLINICAL DATA:  Knee pain, chronic, negative xray (Age >= 5y)   EXAM: MRI OF THE LEFT KNEE WITHOUT CONTRAST   TECHNIQUE: Multiplanar, multisequence MR imaging of the knee was performed. No intravenous contrast was administered.   COMPARISON:  X-ray 10/08/2022, MRI 12/15/2019   FINDINGS: Technical Note: Despite efforts by the technologist and patient, motion artifact is present on today's exam and could  not be eliminated. This reduces exam sensitivity and specificity.   MENISCI   Medial meniscus: Complex tearing of the medial meniscal body and posterior horn with radial component at the posterior horn.   Lateral meniscus: Complex tearing/maceration of the lateral meniscus.   LIGAMENTS   Cruciates: Chronically ACL deficient knee. Mucoid degeneration of the PCL.   Collaterals: Intact MCL. Lateral collateral ligament complex intact.   CARTILAGE   Patellofemoral:  Severe patellofemoral osteoarthritis.   Medial:  Severe medial compartment osteoarthritis.   Lateral:  Severe lateral compartment osteoarthritis.   MISCELLANEOUS   Joint:  No joint effusion. Fat pads within normal limits.   Popliteal Fossa:  No Baker's cyst. Intact popliteus tendon.   Extensor Mechanism:  Intact quadriceps and patellar tendons.   Bones: Tricompartmental joint space narrowing with bulky marginal osteophyte  formation. Advanced subchondral cystic changes within the lateral tibial plateau and lateral femoral condyle. There is some chronic osseous remodeling of the lateral tibial plateau. No fracture or dislocation.   Other: Nonspecific subcutaneous edema.   IMPRESSION: 1. Severe tricompartmental osteoarthritis of the left knee. 2. Complex tearing of the medial and lateral menisci.    PATIENT SURVEYS:  LEFS 08/07/23:  33/80=41.3% ( was 28/80=35%)  COGNITION: Overall cognitive status: Within functional limits for tasks assessed     SENSATION: WFL  EDEMA:  symmetrical  POSTURE: rounded shoulders  PALPATION: Tenderness noted on joint line  LOWER EXTREMITY ROM:  Active ROM Right eval Left eval Right 08/07/23 Left 08/07/23  Hip flexion      Hip extension      Hip abduction      Hip adduction      Hip internal rotation      Hip external rotation      Knee flexion 85 91 85 90  Knee extension 8 32 from 180 8 -25  Ankle dorsiflexion      Ankle plantarflexion      Ankle inversion       Ankle eversion       (Blank rows = not tested)  LOWER EXTREMITY MMT:   MMT Right eval Left eval Right 08/07/23 Left 08/07/23  Hip flexion 3+ 3+ 4- 3+  Hip extension      Hip abduction 4 4 3- in sidelying 3- in sidelying  Hip adduction 4 4 3- in sidelying 3- in sidelying  Hip internal rotation      Hip external rotation      Knee flexion 4- 4- 4 4  Knee extension 4- 4- 4 4  Ankle dorsiflexion 4+ 4+    Ankle plantarflexion      Ankle inversion      Ankle eversion       (Blank rows = not tested)  FUNCTIONAL TESTS:  08/07/2023: 30 second STS test= 7X with UE assist as 5X)  2 minute walk test:  210 feet with SPC (was 150 feet)  Evaluation:30 seconds chair stand test 2 minute walk test: 150  5 reps for 30 second chair stand test  GAIT: Distance walked: 150 Assistive device utilized: Single point cane Level of assistance: Modified independence Comments: decreased velocity, decreased stride length, AD used in R hand, decreased bilateral step length                                                                                                                                TREATMENT DATE:  08/12/23 Standing:  Heel and toe raises 20X  Tandem stance 30" each  Vectors with 1 UE assist 2X5, 3" holds  Marching alternating high holds 20X  Hip abd with GTB 2X10  Hip extension GTB 2X10 (Print outs given for straight leg raises supine and sidelying hip abduction but not completed today) Nustep EOS level 3 UE/LE 5 minutes seat 12  08/07/23 Standing:  Heel and toe raises 20X  Marching alternating high holds 20X  Hip abd with RTB 2X10  Hip extension RTB 2X10 Progress Note:   Functional test measures:  30 second STS test= 7X with UE assist as 5X) 2 minute walk test:  210 feet with SPC (was 150 feet) LEFS 08/07/23:  33/80=41.3% ( was 28/80=35%)   07/31/23 STanding:  heel raises 20X  Toe raise 20X  Marching alternating 20X  Hip abduction 2X10 each with RTB  Hip extension  2X10 each  with RTB  Lateral step ups 2" step 10X each with bil UE assist  Forward step ups 4" 10X each with bil UE assist  Forward lunges onto 4" step with 1 UE assist 2X10 each Sit to stand with foam riser, UE assist 10X   PATIENT EDUCATION:  Education details: Pt was educated on findings of PT evaluation, prognosis, frequency of therapy visits and rationale, attendance policy, and HEP if given.   Person educated: Patient Education method: Explanation, Demonstration, and Handouts Education comprehension: verbalized understanding and needs further education  HOME EXERCISE PROGRAM: Access Code: [email or web address] URL: [email or web address] Date: 06/25/2023 Prepared by: Armond Bertin  Exercises - Seated Long Arc Quad  - 1 x daily - 7 x weekly - 3 sets - 10 reps - Sit to Stand with Arms Crossed  - 1 x daily - 7 x weekly - 3 sets - 10 reps  Date: 08/12/2023 Prepared by: Vickii Grand - Standing Hip Abduction with Counter Support  - 1 x daily - 7 x weekly - 3 sets - 10 reps - Standing Hip Extension with Counter Support  - 1 x daily - 7 x weekly - 3 sets - 10 reps - Standing Marching  - 1 x daily - 7 x weekly - 3 sets - 10 reps - Standing 3-Way Kick  - 1 x daily - 7 x weekly - 3 sets - 10 reps - 3 sec hold - Supine Pelvic Tilt with Straight Leg Raise  - 1 x daily - 7 x weekly - 3 sets - 10 reps - Sidelying Hip Abduction  - 1 x daily - 7 x weekly - 3 sets - 10 reps  ASSESSMENT:  CLINICAL IMPRESSION: Continued with LE strengthening/stability exercises today.  Began tandem with noted challenge bilaterally with only minimal UE touch to establish balance.  Added 3 way kick (vector) to help improve LE stability.  Updated HEP to include this activity as well as standing hip strengthening.  Pt had also requested to add mat exercises last session and printed these off as well, however pt did not wish to go over these again today.  Pt will continue to benefit from skilled therapy to further  improve LE strength/stability as well as functional mobility.  Strength and bil knee ROM continues to be limited. Pt will need an updated HEP including LE strengthening in NWB she can work on in bed.   (Initial) Patient is a 68 y.o. female who was seen today for physical therapy evaluation and treatment for M25.562,G89.29 (ICD-10-CM) - Chronic pain of left knee M25.561,G89.29 (ICD-10-CM) - Chronic pain of right knee M17.0 (ICD-10-CM) - Bilateral primary osteoarthritis of knee R53.81 (ICD-10-CM) - Physical deconditioning.   Patient demonstrates decreased LE strength, abnormal pain rating in bilateral knees, decreased ambulation endurance and quality, and impaired balance. Patient also demonstrates difficulty with ambulation during today's session with decreased stride length and velocity noted. Patient also demonstrates decreased bilateral knee ROM with deficits noted in  both flexion and extension bilaterally. Patient requires increased education for it being okay to use knees and try to improve them to optimize functional mobility. Pt may also benefit from a knee brace on the left knee due to the instability she is having, recommend bariatric knee brace to ensure good circulation to distal LE. Patient would benefit from skilled physical therapy for increased endurance with ambulation, increased LE strength, increased bilateral knee ROM and balance for improved gait quality, return to higher level of function with ADLs, increased independence with gait at work and progress towards therapy goals.   OBJECTIVE IMPAIRMENTS: Abnormal gait, decreased activity tolerance, decreased balance, decreased endurance, decreased mobility, difficulty walking, decreased ROM, decreased strength, hypomobility, obesity, and pain.   ACTIVITY LIMITATIONS: carrying, lifting, bending, standing, squatting, stairs, transfers, bed mobility, continence, and locomotion level  PARTICIPATION LIMITATIONS: meal prep, cleaning, laundry,  shopping, community activity, occupation, and yard work  PERSONAL FACTORS: Age, Fitness, Past/current experiences, and Time since onset of injury/illness/exacerbation are also affecting patient's functional outcome.   REHAB POTENTIAL: Fair chronic in nature  CLINICAL DECISION MAKING: Stable/uncomplicated  EVALUATION COMPLEXITY: Low   GOALS: Goals reviewed with patient? No  SHORT TERM GOALS: Target date: 07/16/23  Patient will demonstrate evidence of independence with individualized HEP and will report compliance for at least 3 days per week for optimized progression towards remaining therapy goals. Baseline:  Goal status: MET  2.  Patient will report a decrease in pain level during community ambulation by at least 2 points for improved quality of life. Baseline: 10/10 for left 6/10 for right Goal status: MET     LONG TERM GOALS: Target date: 08/06/23  Pt will demonstrate a an increase of at least 9 points on the LEFS for improved performance of community ambulation and ADL. Baseline: see above Goal status: IN PROGRESS (improved 5 points)  2.  Pt will improve 2 MWT by 140 feet in order to demonstrate improved functional ambulatory capacity in community setting.  Baseline: see above Goal status: IN PROGRESS (60 more feet)  3.  Pt will demonstrate WFL ROM (flexion and extension) in right knee, and WFL ROM for left knee for increased mobility and maximal efficiency of gait cycle during ambulation. Baseline: see above Goal status: IN PROGRESS  4.  Pt will demonstrate at least 4-/5 MMT for bilateral lower extremities for increased strength during ADL and community ambulation. Baseline: see above Goal status: IN PROGRESS      PLAN:  PT FREQUENCY: 2x/week  PT DURATION: 6 weeks  PLANNED INTERVENTIONS: 97110-Therapeutic exercises, 97530- Therapeutic activity, [id number]- Neuromuscular re-education, 97535- Self Care, [id number]- Manual therapy, [phone]- Gait training, Patient/Family  education, Balance training, Stair training, Joint mobilization, Cryotherapy, and Moist heat  PLAN FOR NEXT SESSION: Progress HEP, bilateral LE strengthening, endurance training, gait training with LRAD.  Go over mat exercises next session if pt feeling up to it.     Lorenso Romance, PTA/CLT [facility] Ph: [phone] 2:37 PM, 08/12/23

## 2023-08-13 ENCOUNTER — Ambulatory Visit (HOSPITAL_COMMUNITY)

## 2023-08-13 DIAGNOSIS — G8929 Other chronic pain: Secondary | ICD-10-CM

## 2023-08-13 DIAGNOSIS — R262 Difficulty in walking, not elsewhere classified: Secondary | ICD-10-CM

## 2023-08-13 DIAGNOSIS — M25562 Pain in left knee: Secondary | ICD-10-CM | POA: Diagnosis not present

## 2023-08-13 NOTE — Therapy (Signed)
 OUTPATIENT PHYSICAL THERAPY LOWER EXTREMITY TREATMENT    Patient Name: Whitney Bauer MRN: [id number] DOB:28-Oct-1955, 68 y.o., female Today's Date: 08/13/2023  END OF SESSION:  PT End of Session - 08/13/23 0720     Visit Number 9    Number of Visits 12    Date for PT Re-Evaluation 09/18/23    Authorization Type [employer] P[employer]    Authorization Time Period no auth required, 30 visits max    Authorization - Visit Number 9    Authorization - Number of Visits 30    Progress Note Due on Visit 10    PT Start Time 0719    PT Stop Time 0759    PT Time Calculation (min) 40 min    Activity Tolerance Patient tolerated treatment well;Patient limited by fatigue;Patient limited by pain    Behavior During Therapy WFL for tasks assessed/performed                  Past Medical History:  Diagnosis Date   Anemia    Arthritis    bilat. knees   Back pain    Back pain    Constipation    Edema of both lower extremities    Family history of adverse reaction to anesthesia    patients mother had cardiac issues during anesthesia   Food allergy    GERD (gastroesophageal reflux disease)    Hypertension    Joint pain    Joint pain    Lactose intolerance    Rheumatoid arthritis (HCC)    Swelling of both lower extremities    Vitamin D  deficiency    Past Surgical History:  Procedure Laterality Date   BIOPSY  12/22/2019   Procedure: BIOPSY;  Surgeon: Ruby Corporal, MD;  [facility]: [facility];  Service: Endoscopy;;  antral   COLONOSCOPY N/A 01/06/2014   Procedure: COLONOSCOPY;  Surgeon: Ruby Corporal, MD;  Location: [facility];  Service: Endoscopy;  Laterality: N/A;  830   COLONOSCOPY WITH PROPOFOL  N/A 12/22/2019   Procedure: COLONOSCOPY WITH PROPOFOL ;  Surgeon: Ruby Corporal, MD;  Location: [facility];  Service: Endoscopy;  Laterality: N/A;  930   ESOPHAGOGASTRODUODENOSCOPY (EGD) WITH PROPOFOL  N/A 12/22/2019   Procedure: ESOPHAGOGASTRODUODENOSCOPY (EGD) WITH PROPOFOL ;  Surgeon:  Ruby Corporal, MD;  Location: [facility];  Service: Endoscopy;  Laterality: N/A;   FOOT SURGERY     PARTIAL HYSTERECTOMY  jan 1999   Patient Active Problem List   Diagnosis Date Noted   Osteoarthritis of both knees 12/27/2021   Hypercholesterolemia 12/27/2021   Elevated BUN 12/27/2021   Elevated cholesterol 11/22/2021   Elevated glucose 11/22/2021   Eating disorder 11/22/2021   Spondylolisthesis at L3-L4 level 04/25/2021   Weakness of both arms 04/25/2021   Vitamin D  deficiency 05/06/2018   Gastroesophageal reflux disease 04/13/2018   Essential hypertension 04/13/2018   Class 3 severe obesity with serious comorbidity and body mass index (BMI) of 50.0 to 59.9 in adult 04/13/2018   Derangement of posterior horn of medial meniscus of right knee 09/10/2016   Chronic pain of right knee 09/10/2016   DOE (dyspnea on exertion) 01/28/2011   Muscle weakness (generalized) 10/23/2010    PCP: Artemisa Bile, MD   REFERRING PROVIDER: Pleasant Brilliant, MD  REFERRING DIAG: [phone] (ICD-10-CM) - Chronic pain of left knee M25.561,G89.29 (ICD-10-CM) - Chronic pain of right knee M17.0 (ICD-10-CM) - Bilateral primary osteoarthritis of knee R53.81 (ICD-10-CM) - Physical deconditioning  THERAPY DIAG:  Chronic pain of left knee  Difficulty in walking, not elsewhere classified  Chronic pain of right knee  Rationale for Evaluation and Treatment: Rehabilitation  ONSET DATE: knee pain has been going on for 10 years  SUBJECTIVE:   SUBJECTIVE STATEMENT: Paitent with report of 7/10 pain in knees possibly worse due to the rain.  Just got off work.    (Initial) Pt reports chronic pain of bilateral knees, rating of 15/10  left knee, 6/10 right knee upon presentation. 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 Pt wants a recommendation for a knee brace for left knee. Pt states that Dr. Has scared her from doing anything involving  knees other than was is absolutely necessary and recommended TKA. Pt fell at [place] and was not able to get up at the time and needed to be lifted up. Pt states she would just be happy to get 25% improvement so she can walk better and wants to walk at work with the cane instead of needing the rollator.   PERTINENT HISTORY: -On weight loss shots, lost significant amount of weight -been a year since last steroid shots -PT about a year ago here probably for the knee (not much help) PAIN:  Are you having pain? No  PRECAUTIONS: Fall  RED FLAGS: Bowel or bladder incontinence: Yes: worse when laying down, increased urine void 4 times per hour, wears pad at night   WEIGHT BEARING RESTRICTIONS: No  FALLS:  Has patient fallen in last 6 months? Yes. Number of falls 1, [employer], Valentines day, [facility] picked her up    LIVING ENVIRONMENT: Lives with: lives with their family, nephew Lives in: House/apartment Stairs: Yes: External: 3 steps; on right going up Has following equipment at home: Single point cane and Environmental consultant - 4 wheeled  OCCUPATION: mail processing in [place]  PLOF: Independent with household mobility with device and Independent with community mobility with device  PATIENT GOALS: pt would like to regain strength, get up from the ground, walk at work with cane not rollator  NEXT MD VISIT: May 7th Ortho  OBJECTIVE:  Note: Objective measures were completed at Evaluation unless otherwise noted.  DIAGNOSTIC FINDINGS:   CLINICAL DATA:  Knee pain, chronic, negative xray (Age >= 5y)   EXAM: MRI OF THE LEFT KNEE WITHOUT CONTRAST   TECHNIQUE: Multiplanar, multisequence MR imaging of the knee was performed. No intravenous contrast was administered.   COMPARISON:  X-ray 10/08/2022, MRI 12/15/2019   FINDINGS: Technical Note: Despite efforts by the technologist and patient, motion artifact is present on today's exam and could not be eliminated. This reduces exam sensitivity  and specificity.   MENISCI   Medial meniscus: Complex tearing of the medial meniscal body and posterior horn with radial component at the posterior horn.   Lateral meniscus: Complex tearing/maceration of the lateral meniscus.   LIGAMENTS   Cruciates: Chronically ACL deficient knee. Mucoid degeneration of the PCL.   Collaterals: Intact MCL. Lateral collateral ligament complex intact.   CARTILAGE   Patellofemoral:  Severe patellofemoral osteoarthritis.   Medial:  Severe medial compartment osteoarthritis.   Lateral:  Severe lateral compartment osteoarthritis.   MISCELLANEOUS   Joint:  No joint effusion. Fat pads within normal limits.   Popliteal Fossa:  No Baker's cyst. Intact popliteus tendon.   Extensor Mechanism:  Intact quadriceps and patellar tendons.   Bones: Tricompartmental joint space narrowing with bulky marginal osteophyte formation. Advanced subchondral cystic changes within the lateral tibial plateau and  lateral femoral condyle. There is some chronic osseous remodeling of the lateral tibial plateau. No fracture or dislocation.   Other: Nonspecific subcutaneous edema.   IMPRESSION: 1. Severe tricompartmental osteoarthritis of the left knee. 2. Complex tearing of the medial and lateral menisci.    PATIENT SURVEYS:  LEFS 08/07/23:  33/80=41.3% ( was 28/80=35%)  COGNITION: Overall cognitive status: Within functional limits for tasks assessed     SENSATION: WFL  EDEMA:  symmetrical  POSTURE: rounded shoulders  PALPATION: Tenderness noted on joint line  LOWER EXTREMITY ROM:  Active ROM Right eval Left eval Right 08/07/23 Left 08/07/23  Hip flexion      Hip extension      Hip abduction      Hip adduction      Hip internal rotation      Hip external rotation      Knee flexion 85 91 85 90  Knee extension 8 32 from 180 8 -25  Ankle dorsiflexion      Ankle plantarflexion      Ankle inversion      Ankle eversion       (Blank rows = not  tested)  LOWER EXTREMITY MMT:   MMT Right eval Left eval Right 08/07/23 Left 08/07/23  Hip flexion 3+ 3+ 4- 3+  Hip extension      Hip abduction 4 4 3- in sidelying 3- in sidelying  Hip adduction 4 4 3- in sidelying 3- in sidelying  Hip internal rotation      Hip external rotation      Knee flexion 4- 4- 4 4  Knee extension 4- 4- 4 4  Ankle dorsiflexion 4+ 4+    Ankle plantarflexion      Ankle inversion      Ankle eversion       (Blank rows = not tested)  FUNCTIONAL TESTS:  08/07/2023: 30 second STS test= 7X with UE assist as 5X)  2 minute walk test:  210 feet with SPC (was 150 feet)  Evaluation:30 seconds chair stand test 2 minute walk test: 150  5 reps for 30 second chair stand test  GAIT: Distance walked: 150 Assistive device utilized: Single point cane Level of assistance: Modified independence Comments: decreased velocity, decreased stride length, AD used in R hand, decreased bilateral step length                                                                                                                                TREATMENT DATE:  08/13/23 Nustep seat 12 x 5' dynamic warm up Trial of kinsiotape bilateral knees Heel raises and toe raises 2 x 10 each Side step in // bars down and back x 4 Slant board 5 x 20" TKE green band x 15 reps each LAQ's x 5 each   08/12/23 Standing:  Heel and toe raises 20X  Tandem stance 30" each  Vectors with 1 UE assist 2X5, 3" holds  Marching  alternating high holds 20X  Hip abd with GTB 2X10  Hip extension GTB 2X10 (Print outs given for straight leg raises supine and sidelying hip abduction but not completed today) Nustep EOS level 3 UE/LE 5 minutes seat 12  08/07/23 Standing:  Heel and toe raises 20X  Marching alternating high holds 20X  Hip abd with RTB 2X10  Hip extension RTB 2X10 Progress Note:   Functional test measures:  30 second STS test= 7X with UE assist as 5X) 2 minute walk test:  210 feet with SPC (was  150 feet) LEFS 08/07/23:  33/80=41.3% ( was 28/80=35%)   07/31/23 STanding:  heel raises 20X  Toe raise 20X  Marching alternating 20X  Hip abduction 2X10 each with RTB  Hip extension 2X10 each  with RTB  Lateral step ups 2" step 10X each with bil UE assist  Forward step ups 4" 10X each with bil UE assist  Forward lunges onto 4" step with 1 UE assist 2X10 each Sit to stand with foam riser, UE assist 10X   PATIENT EDUCATION:  Education details: Pt was educated on findings of PT evaluation, prognosis, frequency of therapy visits and rationale, attendance policy, and HEP if given.   Person educated: Patient Education method: Explanation, Demonstration, and Handouts Education comprehension: verbalized understanding and needs further education  HOME EXERCISE PROGRAM: Access Code: [email or web address] URL: [email or web address] Date: 06/25/2023 Prepared by: Armond Bertin  Exercises - Seated Long Arc Quad  - 1 x daily - 7 x weekly - 3 sets - 10 reps - Sit to Stand with Arms Crossed  - 1 x daily - 7 x weekly - 3 sets - 10 reps  Date: 08/12/2023 Prepared by: Vickii Grand - Standing Hip Abduction with Counter Support  - 1 x daily - 7 x weekly - 3 sets - 10 reps - Standing Hip Extension with Counter Support  - 1 x daily - 7 x weekly - 3 sets - 10 reps - Standing Marching  - 1 x daily - 7 x weekly - 3 sets - 10 reps - Standing 3-Way Kick  - 1 x daily - 7 x weekly - 3 sets - 10 reps - 3 sec hold - Supine Pelvic Tilt with Straight Leg Raise  - 1 x daily - 7 x weekly - 3 sets - 10 reps - Sidelying Hip Abduction  - 1 x daily - 7 x weekly - 3 sets - 10 reps  ASSESSMENT:  CLINICAL IMPRESSION: Today's session with continued focus on bilateral LE strengthening and mobility.  Started with warm up on Nustep. She is unable to wear brace due to excess tissue above her knee so trial of kinesiotape to bilateral knees to try to add support to the knees. Of note her left knee is in significant valgus and  both knees present with extension lag.  Added TKE to try to work on knee extension.  Unable to fully extend either knee although left is worse than right.  Patient will benefit from continued skilled therapy services to address deficits and promote return to optimal function.       Pt will continue to benefit from skilled therapy to further improve LE strength/stability as well as functional mobility.  Strength and bil knee ROM continues to be limited.    (Initial) Patient is a 68 y.o. female who was seen today for physical therapy evaluation and treatment for M25.562,G89.29 (ICD-10-CM) - Chronic pain of left knee M25.561,G89.29 (ICD-10-CM) - Chronic pain of right knee M17.0 (  ICD-10-CM) - Bilateral primary osteoarthritis of knee R53.81 (ICD-10-CM) - Physical deconditioning.   Patient demonstrates decreased LE strength, abnormal pain rating in bilateral knees, decreased ambulation endurance and quality, and impaired balance. Patient also demonstrates difficulty with ambulation during today's session with decreased stride length and velocity noted. Patient also demonstrates decreased bilateral knee ROM with deficits noted in both flexion and extension bilaterally. Patient requires increased education for it being okay to use knees and try to improve them to optimize functional mobility. Pt may also benefit from a knee brace on the left knee due to the instability she is having, recommend bariatric knee brace to ensure good circulation to distal LE. Patient would benefit from skilled physical therapy for increased endurance with ambulation, increased LE strength, increased bilateral knee ROM and balance for improved gait quality, return to higher level of function with ADLs, increased independence with gait at work and progress towards therapy goals.   OBJECTIVE IMPAIRMENTS: Abnormal gait, decreased activity tolerance, decreased balance, decreased endurance, decreased mobility, difficulty walking, decreased  ROM, decreased strength, hypomobility, obesity, and pain.   ACTIVITY LIMITATIONS: carrying, lifting, bending, standing, squatting, stairs, transfers, bed mobility, continence, and locomotion level  PARTICIPATION LIMITATIONS: meal prep, cleaning, laundry, shopping, community activity, occupation, and yard work  PERSONAL FACTORS: Age, Fitness, Past/current experiences, and Time since onset of injury/illness/exacerbation are also affecting patient's functional outcome.   REHAB POTENTIAL: Fair chronic in nature  CLINICAL DECISION MAKING: Stable/uncomplicated  EVALUATION COMPLEXITY: Low   GOALS: Goals reviewed with patient? No  SHORT TERM GOALS: Target date: 07/16/23  Patient will demonstrate evidence of independence with individualized HEP and will report compliance for at least 3 days per week for optimized progression towards remaining therapy goals. Baseline:  Goal status: MET  2.  Patient will report a decrease in pain level during community ambulation by at least 2 points for improved quality of life. Baseline: 10/10 for left 6/10 for right Goal status: MET     LONG TERM GOALS: Target date: 08/06/23  Pt will demonstrate a an increase of at least 9 points on the LEFS for improved performance of community ambulation and ADL. Baseline: see above Goal status: IN PROGRESS (improved 5 points)  2.  Pt will improve 2 MWT by 140 feet in order to demonstrate improved functional ambulatory capacity in community setting.  Baseline: see above Goal status: IN PROGRESS (60 more feet)  3.  Pt will demonstrate WFL ROM (flexion and extension) in right knee, and WFL ROM for left knee for increased mobility and maximal efficiency of gait cycle during ambulation. Baseline: see above Goal status: IN PROGRESS  4.  Pt will demonstrate at least 4-/5 MMT for bilateral lower extremities for increased strength during ADL and community ambulation. Baseline: see above Goal status: IN  PROGRESS      PLAN:  PT FREQUENCY: 2x/week  PT DURATION: 6 weeks  PLANNED INTERVENTIONS: 97110-Therapeutic exercises, 97530- Therapeutic activity, [id number]- Neuromuscular re-education, 97535- Self Care, [id number]- Manual therapy, [phone]- Gait training, Patient/Family education, Balance training, Stair training, Joint mobilization, Cryotherapy, and Moist heat  PLAN FOR NEXT SESSION: Progress HEP, bilateral LE strengthening, endurance training, gait training with LRAD.  Go over mat exercises next session if pt feeling up to it.    8:00 AM, 08/13/23 Latoria Dry Small Crista Nuon MPT [facility] [place] [phone] [phone]

## 2023-08-14 ENCOUNTER — Encounter (HOSPITAL_COMMUNITY)

## 2023-08-27 ENCOUNTER — Encounter (HOSPITAL_COMMUNITY): Payer: Self-pay

## 2023-08-27 ENCOUNTER — Ambulatory Visit (HOSPITAL_COMMUNITY): Attending: Orthopaedic Surgery

## 2023-08-27 DIAGNOSIS — R262 Difficulty in walking, not elsewhere classified: Secondary | ICD-10-CM | POA: Insufficient documentation

## 2023-08-27 DIAGNOSIS — M25562 Pain in left knee: Secondary | ICD-10-CM | POA: Insufficient documentation

## 2023-08-27 DIAGNOSIS — G8929 Other chronic pain: Secondary | ICD-10-CM | POA: Diagnosis present

## 2023-08-27 DIAGNOSIS — M25561 Pain in right knee: Secondary | ICD-10-CM | POA: Insufficient documentation

## 2023-08-27 NOTE — Therapy (Signed)
 OUTPATIENT PHYSICAL THERAPY LOWER EXTREMITY TREATMENT Progress Note Reporting Period 06/25/23 to 08/27/23  See note below for Objective Data and Assessment of Progress/Goals.     Patient Name: Whitney Bauer MRN: [id number] DOB:[birth year], 68 y.o., female Today's Date: 08/27/2023  END OF SESSION:  PT End of Session - 08/27/23 1257     Visit Number 10    Number of Visits 12    Date for PT Re-Evaluation 09/18/23    Authorization Type [employer] P[employer]    Authorization Time Period no auth required, 30 visits max    Authorization - Visit Number 10    Authorization - Number of Visits 30    Progress Note Due on Visit 10    PT Start Time 1301    PT Stop Time 1344    PT Time Calculation (min) 43 min    Activity Tolerance Patient tolerated treatment well;Patient limited by fatigue;Patient limited by pain    Behavior During Therapy WFL for tasks assessed/performed                  Past Medical History:  Diagnosis Date   Anemia    Arthritis    bilat. knees   Back pain    Back pain    Constipation    Edema of both lower extremities    Family history of adverse reaction to anesthesia    patients mother had cardiac issues during anesthesia   Food allergy    GERD (gastroesophageal reflux disease)    Hypertension    Joint pain    Joint pain    Lactose intolerance    Rheumatoid arthritis (HCC)    Swelling of both lower extremities    Vitamin D  deficiency    Past Surgical History:  Procedure Laterality Date   BIOPSY  12/22/2019   Procedure: BIOPSY;  Surgeon: Ruby Corporal, MD;  Location: [facility];  Service: Endoscopy;;  antral   COLONOSCOPY N/A 01/06/2014   Procedure: COLONOSCOPY;  Surgeon: Ruby Corporal, MD;  Location: [facility];  Service: Endoscopy;  Laterality: N/A;  830   COLONOSCOPY WITH PROPOFOL  N/A 12/22/2019   Procedure: COLONOSCOPY WITH PROPOFOL ;  Surgeon: Ruby Corporal, MD;  Location: [facility];  Service: Endoscopy;  Laterality: N/A;  930    ESOPHAGOGASTRODUODENOSCOPY (EGD) WITH PROPOFOL  N/A 12/22/2019   Procedure: ESOPHAGOGASTRODUODENOSCOPY (EGD) WITH PROPOFOL ;  Surgeon: Ruby Corporal, MD;  Location: [facility];  Service: Endoscopy;  Laterality: N/A;   FOOT SURGERY     PARTIAL HYSTERECTOMY  jan 1999   Patient Active Problem List   Diagnosis Date Noted   Osteoarthritis of both knees 12/27/2021   Hypercholesterolemia 12/27/2021   Elevated BUN 12/27/2021   Elevated cholesterol 11/22/2021   Elevated glucose 11/22/2021   Eating disorder 11/22/2021   Spondylolisthesis at L3-L4 level 04/25/2021   Weakness of both arms 04/25/2021   Vitamin D  deficiency 05/06/2018   Gastroesophageal reflux disease 04/13/2018   Essential hypertension 04/13/2018   Class 3 severe obesity with serious comorbidity and body mass index (BMI) of 50.0 to 59.9 in adult 04/13/2018   Derangement of posterior horn of medial meniscus of right knee 09/10/2016   Chronic pain of right knee 09/10/2016   DOE (dyspnea on exertion) 01/28/2011   Muscle weakness (generalized) 10/23/2010    PCP: Artemisa Bile, MD   REFERRING PROVIDER: Pleasant Brilliant, MD  REFERRING DIAG: [phone] (ICD-10-CM) - Chronic pain of left knee M25.561,G89.29 (ICD-10-CM) - Chronic pain of right knee M17.0 (ICD-10-CM) -  Bilateral primary osteoarthritis of knee R53.81 (ICD-10-CM) - Physical deconditioning  THERAPY DIAG:  Chronic pain of left knee  Difficulty in walking, not elsewhere classified  Chronic pain of right knee  Rationale for Evaluation and Treatment: Rehabilitation  ONSET DATE: knee pain has been going on for 10 years  SUBJECTIVE:   SUBJECTIVE STATEMENT: Pt arrived with reports of Lt knee pain 7/10, constant stabbing pain when walking/standing.  Reports she does a lot of her HEP while working 3rd shift.   Paitent with report of 7/10 pain in knees possibly worse due to the rain.  Just got off work.    (Initial) Pt reports chronic pain of bilateral knees, rating  of 15/10  left knee, 6/10 right knee upon presentation. 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 Pt wants a recommendation for a knee brace for left knee. Pt states that Dr. Has scared her from doing anything involving knees other than was is absolutely necessary and recommended TKA. Pt fell at [place] and was not able to get up at the time and needed to be lifted up. Pt states she would just be happy to get 25% improvement so she can walk better and wants to walk at work with the cane instead of needing the rollator.   PERTINENT HISTORY: -On weight loss shots, lost significant amount of weight -been a year since last steroid shots -PT about a year ago here probably for the knee (not much help) PAIN:  Are you having pain? No  PRECAUTIONS: Fall  RED FLAGS: Bowel or bladder incontinence: Yes: worse when laying down, increased urine void 4 times per hour, wears pad at night   WEIGHT BEARING RESTRICTIONS: No  FALLS:  Has patient fallen in last 6 months? Yes. Number of falls 1, [employer], Valentines day, [facility] picked her up    LIVING ENVIRONMENT: Lives with: lives with their family, nephew Lives in: House/apartment Stairs: Yes: External: 3 steps; on right going up Has following equipment at home: Single point cane and Environmental consultant - 4 wheeled  OCCUPATION: mail processing in [place]  PLOF: Independent with household mobility with device and Independent with community mobility with device  PATIENT GOALS: pt would like to regain strength, get up from the ground, walk at work with cane not rollator  NEXT MD VISIT: May 7th Ortho  OBJECTIVE:  Note: Objective measures were completed at Evaluation unless otherwise noted.  DIAGNOSTIC FINDINGS:   CLINICAL DATA:  Knee pain, chronic, negative xray (Age >= 5y)   EXAM: MRI OF THE LEFT KNEE WITHOUT CONTRAST   TECHNIQUE: Multiplanar, multisequence MR imaging of the knee was  performed. No intravenous contrast was administered.   COMPARISON:  X-ray 10/08/2022, MRI 12/15/2019   FINDINGS: Technical Note: Despite efforts by the technologist and patient, motion artifact is present on today's exam and could not be eliminated. This reduces exam sensitivity and specificity.   MENISCI   Medial meniscus: Complex tearing of the medial meniscal body and posterior horn with radial component at the posterior horn.   Lateral meniscus: Complex tearing/maceration of the lateral meniscus.   LIGAMENTS   Cruciates: Chronically ACL deficient knee. Mucoid degeneration of the PCL.   Collaterals: Intact MCL. Lateral collateral ligament complex intact.   CARTILAGE   Patellofemoral:  Severe patellofemoral osteoarthritis.   Medial:  Severe medial compartment osteoarthritis.   Lateral:  Severe lateral compartment osteoarthritis.   MISCELLANEOUS   Joint:  No joint  effusion. Fat pads within normal limits.   Popliteal Fossa:  No Baker's cyst. Intact popliteus tendon.   Extensor Mechanism:  Intact quadriceps and patellar tendons.   Bones: Tricompartmental joint space narrowing with bulky marginal osteophyte formation. Advanced subchondral cystic changes within the lateral tibial plateau and lateral femoral condyle. There is some chronic osseous remodeling of the lateral tibial plateau. No fracture or dislocation.   Other: Nonspecific subcutaneous edema.   IMPRESSION: 1. Severe tricompartmental osteoarthritis of the left knee. 2. Complex tearing of the medial and lateral menisci.    PATIENT SURVEYS:  LEFS 08/07/23:  33/80=41.3% ( was 28/80=35%) 08/27/23:  Lower Extremity Functional Score: 35 / 80 = 43.8 %  COGNITION: Overall cognitive status: Within functional limits for tasks assessed     SENSATION: WFL  EDEMA:  symmetrical  POSTURE: rounded shoulders  PALPATION: Tenderness noted on joint line  LOWER EXTREMITY ROM:  Active ROM Right eval  Left eval Right 08/07/23 Left 08/07/23 Right 08/27/23 Left 08/27/23  Hip flexion        Hip extension        Hip abduction        Hip adduction        Hip internal rotation        Hip external rotation        Knee flexion 85 91 85 90 85 94  Knee extension 8 32 from 180 8 -25 7 22  lacking from neutral  Ankle dorsiflexion        Ankle plantarflexion        Ankle inversion        Ankle eversion         (Blank rows = not tested)  LOWER EXTREMITY MMT:   MMT Right eval Left eval Right 08/07/23 Left 08/07/23 Right 08/27/23 Left  08/27/23  Hip flexion 3+ 3+ 4- 3+ 4/5 4-/5  Hip extension     3- in sidelying 3/5 in sidelying  Hip abduction 4 4 3- in sidelying 3- in sidelying 3- in sidelying, cueing to reduce ER, compensate with hip flexors 3- in sidelying, cueing to reduce ER, compensate with hip flexors  Hip adduction 4 4 3- in sidelying 3- in sidelying    Hip internal rotation        Hip external rotation        Knee flexion 4- 4- 4 4 4  in seated 4 in seated  Knee extension 4- 4- 4 4 4 4   Ankle dorsiflexion 4+ 4+      Ankle plantarflexion        Ankle inversion        Ankle eversion         (Blank rows = not tested)  FUNCTIONAL TESTS:  08/27/23: 275ft with SPC  30 second STS test: 7x without UE assistance   08/07/2023: 30 second STS test= 7X with UE assist as 5X)  2 minute walk test:  210 feet with SPC (was 150 feet)  Evaluation:30 seconds chair stand test 2 minute walk test: 150  5 reps for 30 second chair stand test  GAIT: Distance walked: 150 Assistive device utilized: Single point cane Level of assistance: Modified independence Comments: decreased velocity, decreased stride length, AD used in R hand, decreased bilateral step length  TREATMENT DATE:  08/25/23: 215 with SPC 30 second STS test: 7x without UE assistance MMT see above ROM  see above  Supine:  SAQ 10x 5' Quad sets 10x Heel slides  Lower Extremity Functional Score: 35 / 80 = 43.8 % Gait training with Rt UE holding cane 3 point sequence x 82ft.    08/13/23 Nustep seat 12 x 5' dynamic warm up Trial of kinsiotape bilateral knees Heel raises and toe raises 2 x 10 each Side step in // bars down and back x 4 Slant board 5 x 20" TKE green band x 15 reps each LAQ's x 5 each   08/12/23 Standing:  Heel and toe raises 20X  Tandem stance 30" each  Vectors with 1 UE assist 2X5, 3" holds  Marching alternating high holds 20X  Hip abd with GTB 2X10  Hip extension GTB 2X10 (Print outs given for straight leg raises supine and sidelying hip abduction but not completed today) Nustep EOS level 3 UE/LE 5 minutes seat 12  08/07/23 Standing:  Heel and toe raises 20X  Marching alternating high holds 20X  Hip abd with RTB 2X10  Hip extension RTB 2X10 Progress Note:   Functional test measures:  30 second STS test= 7X with UE assist as 5X) 2 minute walk test:  210 feet with SPC (was 150 feet) LEFS 08/07/23:  33/80=41.3% ( was 28/80=35%)   07/31/23 STanding:  heel raises 20X  Toe raise 20X  Marching alternating 20X  Hip abduction 2X10 each with RTB  Hip extension 2X10 each  with RTB  Lateral step ups 2" step 10X each with bil UE assist  Forward step ups 4" 10X each with bil UE assist  Forward lunges onto 4" step with 1 UE assist 2X10 each Sit to stand with foam riser, UE assist 10X   PATIENT EDUCATION:  Education details: Pt was educated on findings of PT evaluation, prognosis, frequency of therapy visits and rationale, attendance policy, and HEP if given.   Person educated: Patient Education method: Explanation, Demonstration, and Handouts Education comprehension: verbalized understanding and needs further education  HOME EXERCISE PROGRAM: Access Code: [email or web address] URL: [email or web address] Date: 06/25/2023 Prepared by: Armond Bertin  Exercises - Seated Long Arc Quad  - 1 x daily - 7 x weekly - 3 sets - 10 reps - Sit to Stand with Arms Crossed  - 1 x daily - 7 x weekly - 3 sets - 10 reps  Date: 08/12/2023 Prepared by: Vickii Grand - Standing Hip Abduction with Counter Support  - 1 x daily - 7 x weekly - 3 sets - 10 reps - Standing Hip Extension with Counter Support  - 1 x daily - 7 x weekly - 3 sets - 10 reps - Standing Marching  - 1 x daily - 7 x weekly - 3 sets - 10 reps - Standing 3-Way Kick  - 1 x daily - 7 x weekly - 3 sets - 10 reps - 3 sec hold - Supine Pelvic Tilt with Straight Leg Raise  - 1 x daily - 7 x weekly - 3 sets - 10 reps - Sidelying Hip Abduction  - 1 x daily - 7 x weekly - 3 sets - 10 reps  08/27/23:  SAQ   Quad sets    ASSESSMENT:  CLINICAL IMPRESSION: 10th visit progress note with the following findings:  Pt has previously met 2/2 STGs and progressing toward LTGs.  Improved noted included ability to stand without UE support for 30  second sit to stand testings and improved self perceived functional ability with LEFS survery.  Pt continues to be limited by pain with weight bearing, limited ROM impairing gait mechanics and significant weakness in hip and quadriceps.  Added quad sets and SAQ to HEP to address extension lag with printout given.  Educated on using Rt UE to ambulate with SPC to assist with pain during Lt LE stance phase.  Pt will continues to benefit from skilled intervention to address goals unmet.    Pt will continue to benefit from skilled therapy to further improve LE strength/stability as well as functional mobility.  Strength and bil knee ROM continues to be limited.    (Initial) Patient is a 69 y.o. female who was seen today for physical therapy evaluation and treatment for M25.562,G89.29 (ICD-10-CM) - Chronic pain of left knee M25.561,G89.29 (ICD-10-CM) - Chronic pain of right knee M17.0 (ICD-10-CM) - Bilateral primary osteoarthritis of knee R53.81 (ICD-10-CM) - Physical  deconditioning.   Patient demonstrates decreased LE strength, abnormal pain rating in bilateral knees, decreased ambulation endurance and quality, and impaired balance. Patient also demonstrates difficulty with ambulation during today's session with decreased stride length and velocity noted. Patient also demonstrates decreased bilateral knee ROM with deficits noted in both flexion and extension bilaterally. Patient requires increased education for it being okay to use knees and try to improve them to optimize functional mobility. Pt may also benefit from a knee brace on the left knee due to the instability she is having, recommend bariatric knee brace to ensure good circulation to distal LE. Patient would benefit from skilled physical therapy for increased endurance with ambulation, increased LE strength, increased bilateral knee ROM and balance for improved gait quality, return to higher level of function with ADLs, increased independence with gait at work and progress towards therapy goals.   OBJECTIVE IMPAIRMENTS: Abnormal gait, decreased activity tolerance, decreased balance, decreased endurance, decreased mobility, difficulty walking, decreased ROM, decreased strength, hypomobility, obesity, and pain.   ACTIVITY LIMITATIONS: carrying, lifting, bending, standing, squatting, stairs, transfers, bed mobility, continence, and locomotion level  PARTICIPATION LIMITATIONS: meal prep, cleaning, laundry, shopping, community activity, occupation, and yard work  PERSONAL FACTORS: Age, Fitness, Past/current experiences, and Time since onset of injury/illness/exacerbation are also affecting patient's functional outcome.   REHAB POTENTIAL: Fair chronic in nature  CLINICAL DECISION MAKING: Stable/uncomplicated  EVALUATION COMPLEXITY: Low   GOALS: Goals reviewed with patient? No  SHORT TERM GOALS: Target date: 07/16/23  Patient will demonstrate evidence of independence with individualized HEP and will  report compliance for at least 3 days per week for optimized progression towards remaining therapy goals. Baseline:  Goal status: MET  2.  Patient will report a decrease in pain level during community ambulation by at least 2 points for improved quality of life. Baseline: 10/10 for left 6/10 for right Goal status: MET     LONG TERM GOALS: Target date: 08/06/23  Pt will demonstrate a an increase of at least 9 points on the LEFS for improved performance of community ambulation and ADL. Baseline: see above; 08/27/23: improved by 8 points Goal status: IN PROGRESS (improved 5 points)  2.  Pt will improve 2 MWT by 140 feet in order to demonstrate improved functional ambulatory capacity in community setting.  Baseline: see above; 08/27/23:  Improved 54ft Goal status: IN PROGRESS (60 more feet)  3.  Pt will demonstrate WFL ROM (flexion and extension) in right knee, and WFL ROM for left knee for increased mobility and maximal efficiency of  gait cycle during ambulation. Baseline: see above Goal status: IN PROGRESS  4.  Pt will demonstrate at least 4-/5 MMT for bilateral lower extremities for increased strength during ADL and community ambulation. Baseline: see above Goal status: IN PROGRESS      PLAN:  PT FREQUENCY: 2x/week  PT DURATION: 6 weeks  PLANNED INTERVENTIONS: 97110-Therapeutic exercises, 97530- Therapeutic activity, [id number]- Neuromuscular re-education, 97535- Self Care, 16109- Manual therapy, [phone]- Gait training, Patient/Family education, Balance training, Stair training, Joint mobilization, Cryotherapy, and Moist heat  PLAN FOR NEXT SESSION: Progress HEP, bilateral LE strengthening, endurance training, gait training with LRAD.  Go over mat exercises next session if pt feeling up to it.      Minor Amble, LPTA/CLT; Johnye Napoleon [phone]  3:51 PM, 08/27/23  Armond Bertin, PT, DPT [facility] Office: [phone] 10:01 AM, 08/28/23

## 2023-08-29 ENCOUNTER — Encounter (HOSPITAL_COMMUNITY): Payer: Self-pay

## 2023-08-29 ENCOUNTER — Ambulatory Visit (HOSPITAL_COMMUNITY)

## 2023-08-29 DIAGNOSIS — M25562 Pain in left knee: Secondary | ICD-10-CM | POA: Diagnosis not present

## 2023-08-29 DIAGNOSIS — R262 Difficulty in walking, not elsewhere classified: Secondary | ICD-10-CM

## 2023-08-29 DIAGNOSIS — G8929 Other chronic pain: Secondary | ICD-10-CM

## 2023-08-29 NOTE — Therapy (Signed)
 OUTPATIENT PHYSICAL THERAPY LOWER EXTREMITY TREATMENT Progress Note Reporting Period 06/25/23 to 08/27/23  See note below for Objective Data and Assessment of Progress/Goals.     Patient Name: Whitney Bauer MRN: [id number] DOB:February 21, 1956, 68 y.o., female Today's Date: 08/29/2023  END OF SESSION:  PT End of Session - 08/29/23 1609     Visit Number 11    Number of Visits 12    Date for PT Re-Evaluation 09/18/23    Authorization Type [employer] P[employer]    Authorization Time Period no auth required, 30 visits max    Authorization - Visit Number 11    Authorization - Number of Visits 30    Progress Note Due on Visit 10    PT Start Time 1601    PT Stop Time 1647    PT Time Calculation (min) 46 min    Activity Tolerance Patient tolerated treatment well;Patient limited by fatigue;Patient limited by pain    Behavior During Therapy WFL for tasks assessed/performed                   Past Medical History:  Diagnosis Date   Anemia    Arthritis    bilat. knees   Back pain    Back pain    Constipation    Edema of both lower extremities    Family history of adverse reaction to anesthesia    patients mother had cardiac issues during anesthesia   Food allergy    GERD (gastroesophageal reflux disease)    Hypertension    Joint pain    Joint pain    Lactose intolerance    Rheumatoid arthritis (HCC)    Swelling of both lower extremities    Vitamin D  deficiency    Past Surgical History:  Procedure Laterality Date   BIOPSY  12/22/2019   Procedure: BIOPSY;  Surgeon: Ruby Corporal, MD;  [facility]: [facility];  Service: Endoscopy;;  antral   COLONOSCOPY N/A 01/06/2014   Procedure: COLONOSCOPY;  Surgeon: Ruby Corporal, MD;  Location: [facility];  Service: Endoscopy;  Laterality: N/A;  830   COLONOSCOPY WITH PROPOFOL  N/A 12/22/2019   Procedure: COLONOSCOPY WITH PROPOFOL ;  Surgeon: Ruby Corporal, MD;  Location: [facility];  Service: Endoscopy;  Laterality: N/A;  930    ESOPHAGOGASTRODUODENOSCOPY (EGD) WITH PROPOFOL  N/A 12/22/2019   Procedure: ESOPHAGOGASTRODUODENOSCOPY (EGD) WITH PROPOFOL ;  Surgeon: Ruby Corporal, MD;  Location: [facility];  Service: Endoscopy;  Laterality: N/A;   FOOT SURGERY     PARTIAL HYSTERECTOMY  jan 1999   Patient Active Problem List   Diagnosis Date Noted   Osteoarthritis of both knees 12/27/2021   Hypercholesterolemia 12/27/2021   Elevated BUN 12/27/2021   Elevated cholesterol 11/22/2021   Elevated glucose 11/22/2021   Eating disorder 11/22/2021   Spondylolisthesis at L3-L4 level 04/25/2021   Weakness of both arms 04/25/2021   Vitamin D  deficiency 05/06/2018   Gastroesophageal reflux disease 04/13/2018   Essential hypertension 04/13/2018   Class 3 severe obesity with serious comorbidity and body mass index (BMI) of 50.0 to 59.9 in adult 04/13/2018   Derangement of posterior horn of medial meniscus of right knee 09/10/2016   Chronic pain of right knee 09/10/2016   DOE (dyspnea on exertion) 01/28/2011   Muscle weakness (generalized) 10/23/2010    PCP: Artemisa Bile, MD   REFERRING PROVIDER: Pleasant Brilliant, MD  REFERRING DIAG: [phone] (ICD-10-CM) - Chronic pain of left knee M25.561,G89.29 (ICD-10-CM) - Chronic pain of right knee M17.0 (ICD-10-CM) -  Bilateral primary osteoarthritis of knee R53.81 (ICD-10-CM) - Physical deconditioning  THERAPY DIAG:  Chronic pain of left knee  Difficulty in walking, not elsewhere classified  Chronic pain of right knee  Rationale for Evaluation and Treatment: Rehabilitation  ONSET DATE: knee pain has been going on for 10 years  SUBJECTIVE:   SUBJECTIVE STATEMENT: Pt reports knee pain is 6/10. Pt states short arc quad has been bothering her at home. Pt states she would like to be able to get into a SUV, having difficulty lifting left leg.    (Initial) Pt reports chronic pain of bilateral knees, rating of 15/10  left knee, 6/10 right knee upon presentation. 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 Pt wants a recommendation for a knee brace for left knee. Pt states that Dr. Has scared her from doing anything involving knees other than was is absolutely necessary and recommended TKA. Pt fell at [place] and was not able to get up at the time and needed to be lifted up. Pt states she would just be happy to get 25% improvement so she can walk better and wants to walk at work with the cane instead of needing the rollator.   PERTINENT HISTORY: -On weight loss shots, lost significant amount of weight -been a year since last steroid shots -PT about a year ago here probably for the knee (not much help) PAIN:  Are you having pain? No  PRECAUTIONS: Fall  RED FLAGS: Bowel or bladder incontinence: Yes: worse when laying down, increased urine void 4 times per hour, wears pad at night   WEIGHT BEARING RESTRICTIONS: No  FALLS:  Has patient fallen in last 6 months? Yes. Number of falls 1, [employer], Valentines day, [facility] picked her up    LIVING ENVIRONMENT: Lives with: lives with their family, nephew Lives in: House/apartment Stairs: Yes: External: 3 steps; on right going up Has following equipment at home: Single point cane and Environmental consultant - 4 wheeled  OCCUPATION: mail processing in [place]  PLOF: Independent with household mobility with device and Independent with community mobility with device  PATIENT GOALS: pt would like to regain strength, get up from the ground, walk at work with cane not rollator  NEXT MD VISIT: May 7th Ortho  OBJECTIVE:  Note: Objective measures were completed at Evaluation unless otherwise noted.  DIAGNOSTIC FINDINGS:   CLINICAL DATA:  Knee pain, chronic, negative xray (Age >= 5y)   EXAM: MRI OF THE LEFT KNEE WITHOUT CONTRAST   TECHNIQUE: Multiplanar, multisequence MR imaging of the knee was performed. No intravenous contrast was administered.   COMPARISON:   X-ray 10/08/2022, MRI 12/15/2019   FINDINGS: Technical Note: Despite efforts by the technologist and patient, motion artifact is present on today's exam and could not be eliminated. This reduces exam sensitivity and specificity.   MENISCI   Medial meniscus: Complex tearing of the medial meniscal body and posterior horn with radial component at the posterior horn.   Lateral meniscus: Complex tearing/maceration of the lateral meniscus.   LIGAMENTS   Cruciates: Chronically ACL deficient knee. Mucoid degeneration of the PCL.   Collaterals: Intact MCL. Lateral collateral ligament complex intact.   CARTILAGE   Patellofemoral:  Severe patellofemoral osteoarthritis.   Medial:  Severe medial compartment osteoarthritis.   Lateral:  Severe lateral compartment osteoarthritis.   MISCELLANEOUS   Joint:  No joint effusion. Fat pads within normal limits.   Popliteal Fossa:  No Baker's  cyst. Intact popliteus tendon.   Extensor Mechanism:  Intact quadriceps and patellar tendons.   Bones: Tricompartmental joint space narrowing with bulky marginal osteophyte formation. Advanced subchondral cystic changes within the lateral tibial plateau and lateral femoral condyle. There is some chronic osseous remodeling of the lateral tibial plateau. No fracture or dislocation.   Other: Nonspecific subcutaneous edema.   IMPRESSION: 1. Severe tricompartmental osteoarthritis of the left knee. 2. Complex tearing of the medial and lateral menisci.    PATIENT SURVEYS:  LEFS 08/07/23:  33/80=41.3% ( was 28/80=35%) 08/27/23:  Lower Extremity Functional Score: 35 / 80 = 43.8 %  COGNITION: Overall cognitive status: Within functional limits for tasks assessed     SENSATION: WFL  EDEMA:  symmetrical  POSTURE: rounded shoulders  PALPATION: Tenderness noted on joint line  LOWER EXTREMITY ROM:  Active ROM Right eval Left eval Right 08/07/23 Left 08/07/23 Right 08/27/23 Left 08/27/23  Hip flexion         Hip extension        Hip abduction        Hip adduction        Hip internal rotation        Hip external rotation        Knee flexion 85 91 85 90 85 94  Knee extension 8 32 from 180 8 -25 7 22  lacking from neutral  Ankle dorsiflexion        Ankle plantarflexion        Ankle inversion        Ankle eversion         (Blank rows = not tested)  LOWER EXTREMITY MMT:   MMT Right eval Left eval Right 08/07/23 Left 08/07/23 Right 08/27/23 Left  08/27/23  Hip flexion 3+ 3+ 4- 3+ 4/5 4-/5  Hip extension     3- in sidelying 3/5 in sidelying  Hip abduction 4 4 3- in sidelying 3- in sidelying 3- in sidelying, cueing to reduce ER, compensate with hip flexors 3- in sidelying, cueing to reduce ER, compensate with hip flexors  Hip adduction 4 4 3- in sidelying 3- in sidelying    Hip internal rotation        Hip external rotation        Knee flexion 4- 4- 4 4 4  in seated 4 in seated  Knee extension 4- 4- 4 4 4 4   Ankle dorsiflexion 4+ 4+      Ankle plantarflexion        Ankle inversion        Ankle eversion         (Blank rows = not tested)  FUNCTIONAL TESTS:  08/27/23: 239ft with SPC  30 second STS test: 7x without UE assistance   08/07/2023: 30 second STS test= 7X with UE assist as 5X)  2 minute walk test:  210 feet with SPC (was 150 feet)  Evaluation:30 seconds chair stand test 2 minute walk test: 150  5 reps for 30 second chair stand test  GAIT: Distance walked: 150 Assistive device utilized: Single point cane Level of assistance: Modified independence Comments: decreased velocity, decreased stride length, AD used in R hand, decreased bilateral step length  TREATMENT DATE:  08/29/2023  Therapeutic Exercise: -Heel/toe raises, 2 sets of 10 reps, bilaterally with BUE support -Standing 3 way hip 1 sets 10 reps with 2 pound ankle weights, bilaterally,  pt cued for upright trunk and maintaining a neutral spine -Standing marches for speed, 30 seconds, 13 steps  -3 way hip, 2 pound ankle weights, 1 set of 8 reps, BUE support -Cone taps with 2 pound ankle weights from blue foam, 1 set with cones on floor one set with cones on 2 inch step for increased hip flexion    Therapeutic Activity: -Sit to stands, 2 sets of 3 reps, throughout session -Step ups, 4 inch step, 2 set of 10 reps, alternating lead LE, second set for speed 13 reps obtained in 30 seconds -Lateral step up and overs, 4 inch step, 1 set of 5 reps, bilaterally   08/25/23: 215 with SPC 30 second STS test: 7x without UE assistance MMT see above ROM see above  Supine:  SAQ 10x 5' Quad sets 10x Heel slides  Lower Extremity Functional Score: 35 / 80 = 43.8 % Gait training with Rt UE holding cane 3 point sequence x 61ft.    08/13/23 Nustep seat 12 x 5' dynamic warm up Trial of kinsiotape bilateral knees Heel raises and toe raises 2 x 10 each Side step in // bars down and back x 4 Slant board 5 x 20" TKE green band x 15 reps each LAQ's x 5 each   08/12/23 Standing:  Heel and toe raises 20X  Tandem stance 30" each  Vectors with 1 UE assist 2X5, 3" holds  Marching alternating high holds 20X  Hip abd with GTB 2X10  Hip extension GTB 2X10 (Print outs given for straight leg raises supine and sidelying hip abduction but not completed today) Nustep EOS level 3 UE/LE 5 minutes seat 12  08/07/23 Standing:  Heel and toe raises 20X  Marching alternating high holds 20X  Hip abd with RTB 2X10  Hip extension RTB 2X10 Progress Note:   Functional test measures:  30 second STS test= 7X with UE assist as 5X) 2 minute walk test:  210 feet with SPC (was 150 feet) LEFS 08/07/23:  33/80=41.3% ( was 28/80=35%)   07/31/23 STanding:  heel raises 20X  Toe raise 20X  Marching alternating 20X  Hip abduction 2X10 each with RTB  Hip extension 2X10 each  with RTB  Lateral step ups  2" step 10X each with bil UE assist  Forward step ups 4" 10X each with bil UE assist  Forward lunges onto 4" step with 1 UE assist 2X10 each Sit to stand with foam riser, UE assist 10X   PATIENT EDUCATION:  Education details: Pt was educated on findings of PT evaluation, prognosis, frequency of therapy visits and rationale, attendance policy, and HEP if given.   Person educated: Patient Education method: Explanation, Demonstration, and Handouts Education comprehension: verbalized understanding and needs further education  HOME EXERCISE PROGRAM: Access Code: [email or web address] URL: [email or web address] Date: 06/25/2023 Prepared by: Armond Bertin  Exercises - Seated Long Arc Quad  - 1 x daily - 7 x weekly - 3 sets - 10 reps - Sit to Stand with Arms Crossed  - 1 x daily - 7 x weekly - 3 sets - 10 reps  Date: 08/12/2023 Prepared by: Vickii Grand - Standing Hip Abduction with Counter Support  - 1 x daily - 7 x weekly - 3 sets - 10 reps - Standing Hip Extension with Counter Support  -  1 x daily - 7 x weekly - 3 sets - 10 reps - Standing Marching  - 1 x daily - 7 x weekly - 3 sets - 10 reps - Standing 3-Way Kick  - 1 x daily - 7 x weekly - 3 sets - 10 reps - 3 sec hold - Supine Pelvic Tilt with Straight Leg Raise  - 1 x daily - 7 x weekly - 3 sets - 10 reps - Sidelying Hip Abduction  - 1 x daily - 7 x weekly - 3 sets - 10 reps  08/27/23:  SAQ   Quad sets    ASSESSMENT:  CLINICAL IMPRESSION: Patient continues to demonstrate progress towards remaining therapy goals. Pt does continue to demonstrate increased knee pain, decreased  bilateral LE strength, decreased gait quality and balance. Patient also demonstrates improved endurance with standing tolerance, not needing a formal sitting break during today's session. Patient able to progress dynamic balance and core activation exercises today with step up variation and ankle weight activities, good performance with verbal cueing required  for max hip flexion. Patient would continue to benefit from skilled physical therapy for decreased knee pain, increased endurance with ambulation, increased LE strength, and improved balance for improved quality of life, improved independence with gait training and continued progress towards therapy goals.     Pt will continue to benefit from skilled therapy to further improve LE strength/stability as well as functional mobility.  Strength and bil knee ROM continues to be limited.    (Initial) Patient is a 68 y.o. female who was seen today for physical therapy evaluation and treatment for M25.562,G89.29 (ICD-10-CM) - Chronic pain of left knee M25.561,G89.29 (ICD-10-CM) - Chronic pain of right knee M17.0 (ICD-10-CM) - Bilateral primary osteoarthritis of knee R53.81 (ICD-10-CM) - Physical deconditioning.   Patient demonstrates decreased LE strength, abnormal pain rating in bilateral knees, decreased ambulation endurance and quality, and impaired balance. Patient also demonstrates difficulty with ambulation during today's session with decreased stride length and velocity noted. Patient also demonstrates decreased bilateral knee ROM with deficits noted in both flexion and extension bilaterally. Patient requires increased education for it being okay to use knees and try to improve them to optimize functional mobility. Pt may also benefit from a knee brace on the left knee due to the instability she is having, recommend bariatric knee brace to ensure good circulation to distal LE. Patient would benefit from skilled physical therapy for increased endurance with ambulation, increased LE strength, increased bilateral knee ROM and balance for improved gait quality, return to higher level of function with ADLs, increased independence with gait at work and progress towards therapy goals.   OBJECTIVE IMPAIRMENTS: Abnormal gait, decreased activity tolerance, decreased balance, decreased endurance, decreased mobility,  difficulty walking, decreased ROM, decreased strength, hypomobility, obesity, and pain.   ACTIVITY LIMITATIONS: carrying, lifting, bending, standing, squatting, stairs, transfers, bed mobility, continence, and locomotion level  PARTICIPATION LIMITATIONS: meal prep, cleaning, laundry, shopping, community activity, occupation, and yard work  PERSONAL FACTORS: Age, Fitness, Past/current experiences, and Time since onset of injury/illness/exacerbation are also affecting patient's functional outcome.   REHAB POTENTIAL: Fair chronic in nature  CLINICAL DECISION MAKING: Stable/uncomplicated  EVALUATION COMPLEXITY: Low   GOALS: Goals reviewed with patient? No  SHORT TERM GOALS: Target date: 07/16/23  Patient will demonstrate evidence of independence with individualized HEP and will report compliance for at least 3 days per week for optimized progression towards remaining therapy goals. Baseline:  Goal status: MET  2.  Patient will report  a decrease in pain level during community ambulation by at least 2 points for improved quality of life. Baseline: 10/10 for left 6/10 for right Goal status: MET     LONG TERM GOALS: Target date: 08/06/23  Pt will demonstrate a an increase of at least 9 points on the LEFS for improved performance of community ambulation and ADL. Baseline: see above; 08/27/23: improved by 8 points Goal status: IN PROGRESS (improved 5 points)  2.  Pt will improve 2 MWT by 140 feet in order to demonstrate improved functional ambulatory capacity in community setting.  Baseline: see above; 08/27/23:  Improved 58ft Goal status: IN PROGRESS (60 more feet)  3.  Pt will demonstrate WFL ROM (flexion and extension) in right knee, and WFL ROM for left knee for increased mobility and maximal efficiency of gait cycle during ambulation. Baseline: see above Goal status: IN PROGRESS  4.  Pt will demonstrate at least 4-/5 MMT for bilateral lower extremities for increased strength during  ADL and community ambulation. Baseline: see above Goal status: IN PROGRESS      PLAN:  PT FREQUENCY: 2x/week  PT DURATION: 6 weeks  PLANNED INTERVENTIONS: 97110-Therapeutic exercises, 97530- Therapeutic activity, [id number]- Neuromuscular re-education, 97535- Self Care, 96045- Manual therapy, [phone]- Gait training, Patient/Family education, Balance training, Stair training, Joint mobilization, Cryotherapy, and Moist heat  PLAN FOR NEXT SESSION: Progress HEP, bilateral LE strengthening, endurance training, gait training with LRAD.  Go over mat exercises next session if pt feeling up to it.     Armond Bertin, PT, DPT [facility] Office: [phone] 4:59 PM, 08/29/23

## 2023-09-03 ENCOUNTER — Encounter (HOSPITAL_COMMUNITY): Admitting: Physical Therapy

## 2023-09-05 ENCOUNTER — Ambulatory Visit (HOSPITAL_COMMUNITY)

## 2023-09-05 ENCOUNTER — Encounter (HOSPITAL_COMMUNITY): Payer: Self-pay

## 2023-09-05 DIAGNOSIS — M25562 Pain in left knee: Secondary | ICD-10-CM | POA: Diagnosis not present

## 2023-09-05 DIAGNOSIS — G8929 Other chronic pain: Secondary | ICD-10-CM

## 2023-09-05 DIAGNOSIS — R262 Difficulty in walking, not elsewhere classified: Secondary | ICD-10-CM

## 2023-09-05 NOTE — Therapy (Signed)
 OUTPATIENT PHYSICAL THERAPY LOWER EXTREMITY TREATMENT Progress Note Reporting Period 06/25/23 to 08/27/23  See note below for Objective Data and Assessment of Progress/Goals.     Patient Name: Whitney Bauer MRN: [id number] DOB:Dec 15, 1955, 68 y.o., female Today's Date: 09/05/2023  END OF SESSION:  PT End of Session - 09/05/23 1349     Visit Number 12    Number of Visits 12    Date for PT Re-Evaluation 09/18/23    Authorization Type [employer] PPO    Authorization Time Period no auth required, 30 visits max    Authorization - Visit Number 12    Authorization - Number of Visits 30    Progress Note Due on Visit 10    PT Start Time 1349    PT Stop Time 1428    PT Time Calculation (min) 39 min    Activity Tolerance Patient tolerated treatment well;Patient limited by fatigue;Patient limited by pain    Behavior During Therapy WFL for tasks assessed/performed                 Past Medical History:  Diagnosis Date   Anemia    Arthritis    bilat. knees   Back pain    Back pain    Constipation    Edema of both lower extremities    Family history of adverse reaction to anesthesia    patients mother had cardiac issues during anesthesia   Food allergy    GERD (gastroesophageal reflux disease)    Hypertension    Joint pain    Joint pain    Lactose intolerance    Rheumatoid arthritis (HCC)    Swelling of both lower extremities    Vitamin D  deficiency    Past Surgical History:  Procedure Laterality Date   BIOPSY  12/22/2019   Procedure: BIOPSY;  Surgeon: Ruby Corporal, MD;  Location: [facility];  Service: Endoscopy;;  antral   COLONOSCOPY N/A 01/06/2014   Procedure: COLONOSCOPY;  Surgeon: Ruby Corporal, MD;  Location: [facility];  Service: Endoscopy;  Laterality: N/A;  830   COLONOSCOPY WITH PROPOFOL  N/A 12/22/2019   Procedure: COLONOSCOPY WITH PROPOFOL ;  Surgeon: Ruby Corporal, MD;  Location: [facility];  Service: Endoscopy;  Laterality: N/A;  930    ESOPHAGOGASTRODUODENOSCOPY (EGD) WITH PROPOFOL  N/A 12/22/2019   Procedure: ESOPHAGOGASTRODUODENOSCOPY (EGD) WITH PROPOFOL ;  Surgeon: Ruby Corporal, MD;  Location: [facility];  Service: Endoscopy;  Laterality: N/A;   FOOT SURGERY     PARTIAL HYSTERECTOMY  jan 1999   Patient Active Problem List   Diagnosis Date Noted   Osteoarthritis of both knees 12/27/2021   Hypercholesterolemia 12/27/2021   Elevated BUN 12/27/2021   Elevated cholesterol 11/22/2021   Elevated glucose 11/22/2021   Eating disorder 11/22/2021   Spondylolisthesis at L3-L4 level 04/25/2021   Weakness of both arms 04/25/2021   Vitamin D  deficiency 05/06/2018   Gastroesophageal reflux disease 04/13/2018   Essential hypertension 04/13/2018   Class 3 severe obesity with serious comorbidity and body mass index (BMI) of 50.0 to 59.9 in adult 04/13/2018   Derangement of posterior horn of medial meniscus of right knee 09/10/2016   Chronic pain of right knee 09/10/2016   DOE (dyspnea on exertion) 01/28/2011   Muscle weakness (generalized) 10/23/2010    PCP: Artemisa Bile, MD   REFERRING PROVIDER: Pleasant Brilliant, MD  REFERRING DIAG: [phone] (ICD-10-CM) - Chronic pain of left knee M25.561,G89.29 (ICD-10-CM) - Chronic pain of right knee M17.0 (ICD-10-CM) - Bilateral  primary osteoarthritis of knee R53.81 (ICD-10-CM) - Physical deconditioning  THERAPY DIAG:  Chronic pain of left knee  Difficulty in walking, not elsewhere classified  Chronic pain of right knee  Rationale for Evaluation and Treatment: Rehabilitation  ONSET DATE: knee pain has been going on for 10 years  SUBJECTIVE:   SUBJECTIVE STATEMENT: Pt states she has been hurting about 6/10 today, in right knee and right hip. Pt does not know why because she has not been doing much activity. Pt states she has been doing the drawer activity at home.    (Initial) Pt reports chronic pain of bilateral knees, rating of 15/10  left knee, 6/10 right knee upon  presentation. 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 Pt wants a recommendation for a knee brace for left knee. Pt states that Dr. Has scared her from doing anything involving knees other than was is absolutely necessary and recommended TKA. Pt fell at [place] and was not able to get up at the time and needed to be lifted up. Pt states she would just be happy to get 25% improvement so she can walk better and wants to walk at work with the cane instead of needing the rollator.   PERTINENT HISTORY: -On weight loss shots, lost significant amount of weight -been a year since last steroid shots -PT about a year ago here probably for the knee (not much help) PAIN:  Are you having pain? No  PRECAUTIONS: Fall  RED FLAGS: Bowel or bladder incontinence: Yes: worse when laying down, increased urine void 4 times per hour, wears pad at night   WEIGHT BEARING RESTRICTIONS: No  FALLS:  Has patient fallen in last 6 months? Yes. Number of falls 1, [employer], Valentines day, [facility] picked her up    LIVING ENVIRONMENT: Lives with: lives with their family, nephew Lives in: House/apartment Stairs: Yes: External: 3 steps; on right going up Has following equipment at home: Single point cane and Environmental consultant - 4 wheeled  OCCUPATION: mail processing in [place]  PLOF: Independent with household mobility with device and Independent with community mobility with device  PATIENT GOALS: pt would like to regain strength, get up from the ground, walk at work with cane not rollator  NEXT MD VISIT: May 7th Ortho  OBJECTIVE:  Note: Objective measures were completed at Evaluation unless otherwise noted.  DIAGNOSTIC FINDINGS:   CLINICAL DATA:  Knee pain, chronic, negative xray (Age >= 5y)   EXAM: MRI OF THE LEFT KNEE WITHOUT CONTRAST   TECHNIQUE: Multiplanar, multisequence MR imaging of the knee was performed. No intravenous contrast was  administered.   COMPARISON:  X-ray 10/08/2022, MRI 12/15/2019   FINDINGS: Technical Note: Despite efforts by the technologist and patient, motion artifact is present on today's exam and could not be eliminated. This reduces exam sensitivity and specificity.   MENISCI   Medial meniscus: Complex tearing of the medial meniscal body and posterior horn with radial component at the posterior horn.   Lateral meniscus: Complex tearing/maceration of the lateral meniscus.   LIGAMENTS   Cruciates: Chronically ACL deficient knee. Mucoid degeneration of the PCL.   Collaterals: Intact MCL. Lateral collateral ligament complex intact.   CARTILAGE   Patellofemoral:  Severe patellofemoral osteoarthritis.   Medial:  Severe medial compartment osteoarthritis.   Lateral:  Severe lateral compartment osteoarthritis.   MISCELLANEOUS   Joint:  No joint effusion. Fat pads within normal limits.   Popliteal Fossa:  No Baker's cyst. Intact popliteus tendon.   Extensor Mechanism:  Intact quadriceps and patellar tendons.   Bones: Tricompartmental joint space narrowing with bulky marginal osteophyte formation. Advanced subchondral cystic changes within the lateral tibial plateau and lateral femoral condyle. There is some chronic osseous remodeling of the lateral tibial plateau. No fracture or dislocation.   Other: Nonspecific subcutaneous edema.   IMPRESSION: 1. Severe tricompartmental osteoarthritis of the left knee. 2. Complex tearing of the medial and lateral menisci.    PATIENT SURVEYS:  LEFS 08/07/23:  33/80=41.3% ( was 28/80=35%) 08/27/23:  Lower Extremity Functional Score: 35 / 80 = 43.8 %  COGNITION: Overall cognitive status: Within functional limits for tasks assessed     SENSATION: WFL  EDEMA:  symmetrical  POSTURE: rounded shoulders  PALPATION: Tenderness noted on joint line  LOWER EXTREMITY ROM:  Active ROM Right eval Left eval Right 08/07/23 Left 08/07/23  Right 08/27/23 Left 08/27/23  Hip flexion        Hip extension        Hip abduction        Hip adduction        Hip internal rotation        Hip external rotation        Knee flexion 85 91 85 90 85 94  Knee extension 8 32 from 180 8 -25 7 22  lacking from neutral  Ankle dorsiflexion        Ankle plantarflexion        Ankle inversion        Ankle eversion         (Blank rows = not tested)  LOWER EXTREMITY MMT:   MMT Right eval Left eval Right 08/07/23 Left 08/07/23 Right 08/27/23 Left  08/27/23  Hip flexion 3+ 3+ 4- 3+ 4/5 4-/5  Hip extension     3- in sidelying 3/5 in sidelying  Hip abduction 4 4 3- in sidelying 3- in sidelying 3- in sidelying, cueing to reduce ER, compensate with hip flexors 3- in sidelying, cueing to reduce ER, compensate with hip flexors  Hip adduction 4 4 3- in sidelying 3- in sidelying    Hip internal rotation        Hip external rotation        Knee flexion 4- 4- 4 4 4  in seated 4 in seated  Knee extension 4- 4- 4 4 4 4   Ankle dorsiflexion 4+ 4+      Ankle plantarflexion        Ankle inversion        Ankle eversion         (Blank rows = not tested)  FUNCTIONAL TESTS:  08/27/23: 214ft with SPC  30 second STS test: 7x without UE assistance   08/07/2023: 30 second STS test= 7X with UE assist as 5X)  2 minute walk test:  210 feet with SPC (was 150 feet)  Evaluation:30 seconds chair stand test 2 minute walk test: 150  5 reps for 30 second chair stand test  GAIT: Distance walked: 150 Assistive device utilized: Single point cane Level of assistance: Modified independence Comments: decreased velocity, decreased stride length, AD used in R hand, decreased bilateral step length  TREATMENT DATE:  09/05/2023  Therapeutic Exercise: -Nustep, 6 minutes 30 seconds, 60 spm -Standing 3 way hip 1 sets 8 reps with GTB at ankle,  bilaterally, pt cued for upright trunk and maintaining a neutral spine, BUE support utilized -Lateral stepping, 3 laps in parallel bars, GTB at ankles, no UE support used -Parallel bar hurdle obstacle course, 4 inch hurdles only, 1 lap fwd and one lap lateral stepping, pt demonstrates decreased knee flexion capability especially with lateral stepping. BUE support utilized    Manual therapy: -Grade II and III PA and AP mobs of bilateral knee joints -PROM of bilateral knees for increased ROM, pt limited due to pain bilaterally.   08/29/2023  Therapeutic Exercise: -Heel/toe raises, 2 sets of 10 reps, bilaterally with BUE support -Standing 3 way hip 1 sets 10 reps with 2 pound ankle weights, bilaterally, pt cued for upright trunk and maintaining a neutral spine -Standing marches for speed, 30 seconds, 13 steps  -3 way hip, 2 pound ankle weights, 1 set of 8 reps, BUE support -Cone taps with 2 pound ankle weights from blue foam, 1 set with cones on floor one set with cones on 2 inch step for increased hip flexion    Therapeutic Activity: -Sit to stands, 2 sets of 3 reps, throughout session -Step ups, 4 inch step, 2 set of 10 reps, alternating lead LE, second set for speed 13 reps obtained in 30 seconds -Lateral step up and overs, 4 inch step, 1 set of 5 reps, bilaterally   08/25/23: 215 with SPC 30 second STS test: 7x without UE assistance MMT see above ROM see above  Supine:  SAQ 10x 5' Quad sets 10x Heel slides  Lower Extremity Functional Score: 35 / 80 = 43.8 % Gait training with Rt UE holding cane 3 point sequence x 103ft.    08/13/23 Nustep seat 12 x 5' dynamic warm up Trial of kinsiotape bilateral knees Heel raises and toe raises 2 x 10 each Side step in // bars down and back x 4 Slant board 5 x 20 TKE green band x 15 reps each LAQ's x 5 each   08/12/23 Standing:  Heel and toe raises 20X  Tandem stance 30 each  Vectors with 1 UE assist 2X5, 3 holds  Marching  alternating high holds 20X  Hip abd with GTB 2X10  Hip extension GTB 2X10 (Print outs given for straight leg raises supine and sidelying hip abduction but not completed today) Nustep EOS level 3 UE/LE 5 minutes seat 12  08/07/23 Standing:  Heel and toe raises 20X  Marching alternating high holds 20X  Hip abd with RTB 2X10  Hip extension RTB 2X10 Progress Note:   Functional test measures:  30 second STS test= 7X with UE assist as 5X) 2 minute walk test:  210 feet with SPC (was 150 feet) LEFS 08/07/23:  33/80=41.3% ( was 28/80=35%)   07/31/23 STanding:  heel raises 20X  Toe raise 20X  Marching alternating 20X  Hip abduction 2X10 each with RTB  Hip extension 2X10 each  with RTB  Lateral step ups 2 step 10X each with bil UE assist  Forward step ups 4 10X each with bil UE assist  Forward lunges onto 4 step with 1 UE assist 2X10 each Sit to stand with foam riser, UE assist 10X   PATIENT EDUCATION:  Education details: Pt was educated on findings of PT evaluation, prognosis, frequency of therapy visits and rationale, attendance policy, and HEP if given.  Person educated: Patient Education method: Explanation, Demonstration, and Handouts Education comprehension: verbalized understanding and needs further education  HOME EXERCISE PROGRAM: Access Code: [email or web address] URL: [email or web address] Date: 06/25/2023 Prepared by: Armond Bertin  Exercises - Seated Long Arc Quad  - 1 x daily - 7 x weekly - 3 sets - 10 reps - Sit to Stand with Arms Crossed  - 1 x daily - 7 x weekly - 3 sets - 10 reps  Date: 08/12/2023 Prepared by: Vickii Grand - Standing Hip Abduction with Counter Support  - 1 x daily - 7 x weekly - 3 sets - 10 reps - Standing Hip Extension with Counter Support  - 1 x daily - 7 x weekly - 3 sets - 10 reps - Standing Marching  - 1 x daily - 7 x weekly - 3 sets - 10 reps - Standing 3-Way Kick  - 1 x daily - 7 x weekly - 3 sets - 10 reps - 3 sec hold - Supine Pelvic  Tilt with Straight Leg Raise  - 1 x daily - 7 x weekly - 3 sets - 10 reps - Sidelying Hip Abduction  - 1 x daily - 7 x weekly - 3 sets - 10 reps  08/27/23:  SAQ   Quad sets    ASSESSMENT:  CLINICAL IMPRESSION: Patient continues to demonstrate decreased LE strength and bilateral knee ROM, decreased gait quality and balance. Patient also demonstrates decreased endurance with aerobic based exercise during today's session. Patient able to continue dynamic balance and core activation exercises today with aeromat walks, good performance with verbal cueing. Manual techniques utilized to improve bilateral knee ROM but pt still limited significantly by pain today. Patient would continue to benefit from skilled physical therapy for increased endurance with ambulation, increased BLE strength, and improved balance for improved quality of life, improved independence with gait training and continued progress towards therapy goals.      Pt will continue to benefit from skilled therapy to further improve LE strength/stability as well as functional mobility.  Strength and bil knee ROM continues to be limited.    (Initial) Patient is a 68 y.o. female who was seen today for physical therapy evaluation and treatment for M25.562,G89.29 (ICD-10-CM) - Chronic pain of left knee M25.561,G89.29 (ICD-10-CM) - Chronic pain of right knee M17.0 (ICD-10-CM) - Bilateral primary osteoarthritis of knee R53.81 (ICD-10-CM) - Physical deconditioning.   Patient demonstrates decreased LE strength, abnormal pain rating in bilateral knees, decreased ambulation endurance and quality, and impaired balance. Patient also demonstrates difficulty with ambulation during today's session with decreased stride length and velocity noted. Patient also demonstrates decreased bilateral knee ROM with deficits noted in both flexion and extension bilaterally. Patient requires increased education for it being okay to use knees and try to improve them to  optimize functional mobility. Pt may also benefit from a knee brace on the left knee due to the instability she is having, recommend bariatric knee brace to ensure good circulation to distal LE. Patient would benefit from skilled physical therapy for increased endurance with ambulation, increased LE strength, increased bilateral knee ROM and balance for improved gait quality, return to higher level of function with ADLs, increased independence with gait at work and progress towards therapy goals.   OBJECTIVE IMPAIRMENTS: Abnormal gait, decreased activity tolerance, decreased balance, decreased endurance, decreased mobility, difficulty walking, decreased ROM, decreased strength, hypomobility, obesity, and pain.   ACTIVITY LIMITATIONS: carrying, lifting, bending, standing, squatting, stairs, transfers, bed mobility, continence, and locomotion level  PARTICIPATION  LIMITATIONS: meal prep, cleaning, laundry, shopping, community activity, occupation, and yard work  PERSONAL FACTORS: Age, Fitness, Past/current experiences, and Time since onset of injury/illness/exacerbation are also affecting patient's functional outcome.   REHAB POTENTIAL: Fair chronic in nature  CLINICAL DECISION MAKING: Stable/uncomplicated  EVALUATION COMPLEXITY: Low   GOALS: Goals reviewed with patient? No  SHORT TERM GOALS: Target date: 07/16/23  Patient will demonstrate evidence of independence with individualized HEP and will report compliance for at least 3 days per week for optimized progression towards remaining therapy goals. Baseline:  Goal status: MET  2.  Patient will report a decrease in pain level during community ambulation by at least 2 points for improved quality of life. Baseline: 10/10 for left 6/10 for right Goal status: MET     LONG TERM GOALS: Target date: 08/06/23  Pt will demonstrate a an increase of at least 9 points on the LEFS for improved performance of community ambulation and  ADL. Baseline: see above; 08/27/23: improved by 8 points Goal status: IN PROGRESS (improved 5 points)  2.  Pt will improve 2 MWT by 140 feet in order to demonstrate improved functional ambulatory capacity in community setting.  Baseline: see above; 08/27/23:  Improved 80ft Goal status: IN PROGRESS (60 more feet)  3.  Pt will demonstrate WFL ROM (flexion and extension) in right knee, and WFL ROM for left knee for increased mobility and maximal efficiency of gait cycle during ambulation. Baseline: see above Goal status: IN PROGRESS  4.  Pt will demonstrate at least 4-/5 MMT for bilateral lower extremities for increased strength during ADL and community ambulation. Baseline: see above Goal status: IN PROGRESS      PLAN:  PT FREQUENCY: 2x/week  PT DURATION: 6 weeks  PLANNED INTERVENTIONS: 97110-Therapeutic exercises, 97530- Therapeutic activity, [id number]- Neuromuscular re-education, 97535- Self Care, 16109- Manual therapy, [id number]- Gait training, Patient/Family education, Balance training, Stair training, Joint mobilization, Cryotherapy, and Moist heat  PLAN FOR NEXT SESSION: Progress HEP, bilateral LE strengthening, endurance training, gait training with LRAD.  Go over mat exercises next session if pt feeling up to it.     Armond Bertin, PT, DPT [facility] Office: [phone] 4:54 PM, 09/05/23

## 2023-09-09 ENCOUNTER — Encounter (HOSPITAL_COMMUNITY)

## 2023-09-11 ENCOUNTER — Encounter (HOSPITAL_COMMUNITY): Admitting: Physical Therapy

## 2023-09-16 ENCOUNTER — Encounter (HOSPITAL_COMMUNITY): Admitting: Physical Therapy

## 2023-09-18 ENCOUNTER — Encounter (HOSPITAL_COMMUNITY)

## 2023-09-25 ENCOUNTER — Emergency Department (HOSPITAL_COMMUNITY)
Admission: EM | Admit: 2023-09-25 | Discharge: 2023-09-26 | Disposition: A | Discharge status: Home / Self Care | Attending: Emergency Medicine | Admitting: Emergency Medicine

## 2023-09-25 ENCOUNTER — Other Ambulatory Visit: Payer: Self-pay

## 2023-09-25 ENCOUNTER — Encounter (HOSPITAL_COMMUNITY): Payer: Self-pay

## 2023-09-25 DIAGNOSIS — Z79899 Other long term (current) drug therapy: Secondary | ICD-10-CM | POA: Insufficient documentation

## 2023-09-25 DIAGNOSIS — R251 Tremor, unspecified: Secondary | ICD-10-CM | POA: Diagnosis present

## 2023-09-25 DIAGNOSIS — R509 Fever, unspecified: Secondary | ICD-10-CM | POA: Insufficient documentation

## 2023-09-25 DIAGNOSIS — N3001 Acute cystitis with hematuria: Secondary | ICD-10-CM | POA: Diagnosis not present

## 2023-09-25 DIAGNOSIS — I10 Essential (primary) hypertension: Secondary | ICD-10-CM | POA: Diagnosis not present

## 2023-09-25 LAB — CBC WITH DIFFERENTIAL/PLATELET
Abs Immature Granulocytes: 0.02 10*3/uL (ref 0.00–0.07)
Basophils Absolute: 0 10*3/uL (ref 0.0–0.1)
Basophils Relative: 0 %
Eosinophils Absolute: 0 10*3/uL (ref 0.0–0.5)
Eosinophils Relative: 0 %
HCT: 38.8 % (ref 36.0–46.0)
Hemoglobin: 12.3 g/dL (ref 12.0–15.0)
Immature Granulocytes: 0 %
Lymphocytes Relative: 4 %
Lymphs Abs: 0.4 10*3/uL — ABNORMAL LOW (ref 0.7–4.0)
MCH: 28.2 pg (ref 26.0–34.0)
MCHC: 31.7 g/dL (ref 30.0–36.0)
MCV: 89 fL (ref 80.0–100.0)
Monocytes Absolute: 0.5 10*3/uL (ref 0.1–1.0)
Monocytes Relative: 6 %
Neutro Abs: 8.2 10*3/uL — ABNORMAL HIGH (ref 1.7–7.7)
Neutrophils Relative %: 90 %
Platelets: 216 10*3/uL (ref 150–400)
RBC: 4.36 MIL/uL (ref 3.87–5.11)
RDW: 13.8 % (ref 11.5–15.5)
WBC: 9.1 10*3/uL (ref 4.0–10.5)
nRBC: 0 % (ref 0.0–0.2)

## 2023-09-25 LAB — COMPREHENSIVE METABOLIC PANEL WITH GFR
ALT: 22 U/L (ref 0–44)
AST: 17 U/L (ref 15–41)
Albumin: 4.3 g/dL (ref 3.5–5.0)
Alkaline Phosphatase: 64 U/L (ref 38–126)
Anion gap: 13 (ref 5–15)
BUN: 18 mg/dL (ref 8–23)
CO2: 24 mmol/L (ref 22–32)
Calcium: 9.5 mg/dL (ref 8.9–10.3)
Chloride: 102 mmol/L (ref 98–111)
Creatinine, Ser: 0.77 mg/dL (ref 0.44–1.00)
GFR, Estimated: >60 mL/min (ref >60)
Glucose, Bld: 97 mg/dL (ref 70–99)
Potassium: 3.9 mmol/L (ref 3.5–5.1)
Sodium: 139 mmol/L (ref 135–145)
Total Bilirubin: 0.8 mg/dL (ref 0.0–1.2)
Total Protein: 7.7 g/dL (ref 6.5–8.1)

## 2023-09-25 LAB — URINALYSIS, ROUTINE W REFLEX MICROSCOPIC
Bilirubin Urine: NEGATIVE
Glucose, UA: NEGATIVE mg/dL
Ketones, ur: NEGATIVE mg/dL
Nitrite: POSITIVE — AB
Protein, ur: 30 mg/dL — AB
Specific Gravity, Urine: 1.012 (ref 1.005–1.030)
WBC, UA: >50 WBC/hpf (ref 0–5)
pH: 5 (ref 5.0–8.0)

## 2023-09-25 LAB — CBG MONITORING, ED: Glucose-Capillary: 95 mg/dL (ref 70–99)

## 2023-09-25 MED ORDER — ONDANSETRON HCL 4 MG/2ML IJ SOLN: 4.0000 mg | Freq: Once | INTRAMUSCULAR | Status: DC

## 2023-09-25 NOTE — ED Triage Notes (Signed)
 Pt arrived via POV from home c/o tremors she experienced after waking up from a nap. Pt reports she drank a couple bottles of water  and the shaking went away. Pt reports she called the ER and was advised to come to the ER for evaluation. Pt denies any tremors at this time. Pt denies Nausea.

## 2023-09-25 NOTE — ED Provider Notes (Signed)
 [place] EMERGENCY DEPARTMENT AT [facility] Provider Note   CSN: [id number] Arrival date & time: 09/25/23  1810     Patient presents with: Tremors   Solomiya C Dittman is a 68 y.o. female.   HPI     Sanjuanita C Yaun is a 68 y.o. female past medical history of hypertension, GERD back pain who presents to the Emergency Department complaining of sudden episode of shaking chills earlier today.  States she woke up from a nap with uncontrollable chills.  She drank 2 bottles of water  and chills resolved.  Also endorses subjective fever at home.  She denies any pain, recurrent chills, nausea or vomiting, hematuria, abdominal pain.  She does endorse increased urinary frequency.  Denies flank pain or pain with urination  Prior to Admission medications   Medication Sig Start Date End Date Taking? Authorizing Provider  cyclobenzaprine  (FLEXERIL ) 10 MG tablet TAKE 1 TABLET BY MOUTH DAILY AS NEEDED FOR MUSCLE SPASMS. 09/04/20   Brenna Lin, MD  esomeprazole (NEXIUM) 40 MG capsule Take 40 mg by mouth daily.  11/22/19   [provider]  loperamide (IMODIUM) 1 MG/5ML solution Take 2-4 mg by mouth 4 (four) times daily as needed for diarrhea or loose stools.     [provider]  oxyCODONE -acetaminophen  (PERCOCET/ROXICET) 5-325 MG tablet Take 1 tablet by mouth every 4 (four) hours as needed for severe pain. Patient not taking: Reported on 06/25/2023 03/07/22   Brenna Lin, MD  traMADol  (ULTRAM ) 50 MG tablet Take 1 tablet (50 mg total) by mouth every 6 (six) hours as needed. One tablet every six hours as needed for pain. Patient not taking: Reported on 06/25/2023 05/22/23   Brenna Lin, MD  ZEPBOUND 12.5 MG/0.5ML Pen Inject 12.5 mg into the skin once a week. 05/01/23   [provider]    Allergies: Pantoprazole     Review of Systems  Constitutional:  Positive for chills and fever.  Respiratory:  Negative for cough and shortness of breath.   Cardiovascular:  Negative for  chest pain.  Gastrointestinal:  Negative for abdominal pain, diarrhea, nausea and vomiting.  Genitourinary:  Positive for frequency. Negative for dysuria and flank pain.  Musculoskeletal:  Negative for arthralgias and back pain.  Neurological:  Negative for dizziness, weakness and numbness.    Updated Vital Signs BP (!) 149/94 (BP Location: Right Arm)   Pulse (!) 102   Temp 99.4 F (37.4 C) (Oral)   Resp 16   Ht 5' 2 (1.575 m)   Wt 107.5 kg   SpO2 98%   BMI 43.35 kg/m   Physical Exam Vitals and nursing note reviewed.  Constitutional:      General: She is not in acute distress.    Appearance: Normal appearance. She is not ill-appearing or toxic-appearing.  Cardiovascular:     Rate and Rhythm: Normal rate and regular rhythm.     Pulses: Normal pulses.  Pulmonary:     Effort: Pulmonary effort is normal.  Abdominal:     Palpations: Abdomen is soft.     Tenderness: There is no abdominal tenderness. There is no right CVA tenderness or left CVA tenderness.     Comments: No suprapubic tenderness on exam  Musculoskeletal:        General: Normal range of motion.  Skin:    General: Skin is warm.     Capillary Refill: Capillary refill takes less than 2 seconds.  Neurological:     General: No focal deficit present.  Mental Status: She is alert.     Sensory: No sensory deficit.     Motor: No weakness.     (all labs ordered are listed, but only abnormal results are displayed) Labs Reviewed  CBC WITH DIFFERENTIAL/PLATELET - Abnormal; Notable for the following components:      Result Value   Neutro Abs 8.2 (*)    Lymphs Abs 0.4 (*)    All other components within normal limits  COMPREHENSIVE METABOLIC PANEL WITH GFR  URINALYSIS, ROUTINE W REFLEX MICROSCOPIC  CBG MONITORING, ED    EKG: None  Radiology: No results found.   Procedures   Medications Ordered in the ED - No data to display                                  Medical Decision Making Patient here for  evaluation after having spontaneous chills earlier today after waking from a nap.  Chills spontaneously resolved after drinking 2 bottles of water .  She notes fever at home of 100.  No antipyretics.  She denies any flank pain abdominal pain nausea or vomiting.  No chest pain or shortness of breath.  She does have however some increased urinary frequency.  No flank pain on exam but UTI, pyelonephritis, kidney stone, other infectious process including bacterial or viral considered  Amount and/or Complexity of Data Reviewed Labs: ordered.    Details: Labs no evidence of leukocytosis, chemistries without derangement.  Blood sugar 95  Urinalysis shows infection, urine culture pending Discussion of management or test interpretation with external provider(s): Pt well appearing here.  No fever, leukocytosis and she denies pain or chills at this time.  No sepsis.  Urine is infected. Culture pending.  Rocephin  here and prescription for Keflex .  She Agrees to close out pt f/u with PCP early nest week.  Return precautions given   Risk Prescription drug management.        Final diagnoses:  Acute cystitis with hematuria    ED Discharge Orders     None          Herlinda Milling, DEVONNA 09/26/23 2320    Towana Ozell BROCKS, MD 09/27/23 1020

## 2023-09-26 MED ORDER — CEPHALEXIN 500 MG PO CAPS: 500.0000 mg | ORAL_CAPSULE | Freq: Four times a day (QID) | ORAL | 0 refills | Status: DC

## 2023-09-26 MED ORDER — CEFTRIAXONE SODIUM 1 G IJ SOLR
1.0000 g | Freq: Once | INTRAMUSCULAR | Status: AC
  Administered 2023-09-26: 1 g via INTRAMUSCULAR
  Filled 2023-09-26: qty 10

## 2023-09-26 NOTE — Discharge Instructions (Signed)
 Your urine test this evening shows that you have a urinary tract infection.  It is very important that you take the antibiotic as directed until finished.  You may start the antibiotic tomorrow.  Drink plenty of water  and cranberry juice.  You may take Tylenol  every 4 hours if needed for pain and/or fever.  Please follow-up with your primary care provider next week for recheck, return to the emergency department if you develop any new worsening symptoms.

## 2023-09-28 LAB — URINE CULTURE: Culture: >=100000 — AB

## 2023-09-29 ENCOUNTER — Telehealth (HOSPITAL_BASED_OUTPATIENT_CLINIC_OR_DEPARTMENT_OTHER): Payer: Self-pay | Admitting: *Deleted

## 2023-09-29 NOTE — Telephone Encounter (Signed)
 Post ED Visit - Positive Culture Follow-up  Culture report reviewed by antimicrobial stewardship pharmacist: [facility] [x]  [place], [place].D. []  Venetia Gully, Pharm.D., BCPS AQ-ID []  Garrel Crews, Pharm.D., BCPS []  Almarie Lunger, Pharm.D., BCPS []  [place], [address].D., BCPS, AAHIVP []  Rosaline Bihari, Pharm.D., BCPS, AAHIVP []  Vernell Meier, PharmD, BCPS []  Latanya Hint, PharmD, BCPS []  Donald Medley, PharmD, BCPS []  Rocky Bold, PharmD []  Dorothyann Alert, PharmD, BCPS []  Morene Babe, PharmD  [facility] []  Rosaline Edison, PharmD []  Romona Bliss, PharmD []  Dolphus Roller, PharmD []  Veva Seip, Rph []  Vernell Daunt) Leonce, PharmD []  Eva Allis, PharmD []  Rosaline Millet, PharmD []  Iantha Batch, PharmD []  Arvin Gauss, PharmD []  Wanda Hasting, PharmD []  Ronal Rav, PharmD []  Rocky Slade, PharmD []  Bard Jeans, PharmD   Positive urine culture Treated with Cephalexin , organism sensitive to the same and no further patient follow-up is required at this time.  Jama Lisle Blondie 09/29/2023, 1:35 PM

## 2023-10-01 ENCOUNTER — Ambulatory Visit: Admitting: Orthopaedic Surgery

## 2023-10-01 ENCOUNTER — Encounter: Payer: Self-pay | Admitting: Orthopaedic Surgery

## 2023-10-01 VITALS — BP 143/93 | HR 79 | Ht 62.0 in | Wt 247.0 lb

## 2023-10-01 DIAGNOSIS — M170 Bilateral primary osteoarthritis of knee: Secondary | ICD-10-CM | POA: Diagnosis not present

## 2023-10-01 DIAGNOSIS — Z6841 Body Mass Index (BMI) 40.0 and over, adult: Secondary | ICD-10-CM

## 2023-10-01 MED ORDER — DICLOFENAC SODIUM 75 MG PO TBEC: 75.0000 mg | DELAYED_RELEASE_TABLET | Freq: Two times a day (BID) | ORAL | 2 refills | Status: AC

## 2023-10-01 NOTE — Progress Notes (Signed)
 My knees hurt.  She has gained a little weight.  She is off the weight loss drug.  She has pain in both knees.  She would like to go back on Voltaren  pills.  I will resume this.  She wants viscosupplementation for the knees.  We talked about this in detail and I will order it.  It may not help her but it is worth a try.  She has no new trauma.  She uses a cane.  Both knees have crepitus, effusion, Right ROM 0 to 95, left 0 to 100.  NV intact.  Encounter Diagnoses  Name Primary?   Bilateral primary osteoarthritis of knee Yes   Body mass index 45.0-49.9, adult (HCC)    Return in three weeks for viscosupplementation.  Call if any problem.  Precautions discussed.  Electronically Signed Lemond Stable, MD 7/9/20253:19 PM

## 2023-10-01 NOTE — Patient Instructions (Signed)

## 2023-10-08 ENCOUNTER — Telehealth: Payer: Self-pay

## 2023-10-08 NOTE — Telephone Encounter (Signed)
 FYI-  Euflexxa is non preferred with patients insurance.  I have submitted for Orthovisc and Durolane to see which one is covered under patients insurance. Thank you.

## 2023-10-15 ENCOUNTER — Telehealth: Payer: Self-pay

## 2023-10-15 NOTE — Telephone Encounter (Signed)
 PA submitted online for [facility] through Covermymeds for Orthovisc, bilateral knee PA pending key: [id number]

## 2023-10-15 NOTE — Telephone Encounter (Signed)
 noted

## 2023-10-17 ENCOUNTER — Telehealth: Payer: Self-pay

## 2023-10-17 NOTE — Telephone Encounter (Signed)
 Please schedule patient 2 appts.for gel injections with Dr. Brenna.  Per [facility], Euflexxa is one of the preferred products that is required for patient to try through [facility].  Gel Information can be located under the referrals tab.

## 2023-10-22 ENCOUNTER — Ambulatory Visit: Admitting: Orthopaedic Surgery

## 2023-10-22 ENCOUNTER — Encounter: Payer: Self-pay | Admitting: Orthopaedic Surgery

## 2023-10-22 DIAGNOSIS — M170 Bilateral primary osteoarthritis of knee: Secondary | ICD-10-CM

## 2023-10-22 DIAGNOSIS — Z6841 Body Mass Index (BMI) 40.0 and over, adult: Secondary | ICD-10-CM

## 2023-10-22 DIAGNOSIS — G8929 Other chronic pain: Secondary | ICD-10-CM

## 2023-10-22 MED ORDER — SODIUM HYALURONATE (VISCOSUP) 20 MG/2ML IX SOSY
20.0000 mg | PREFILLED_SYRINGE | Freq: Once | INTRA_ARTICULAR | Status: AC
  Administered 2023-10-22: 20 mg via INTRA_ARTICULAR

## 2023-10-22 NOTE — Progress Notes (Signed)
 This patient is diagnosed with osteoarthritis of the knee(s).    Radiographs show evidence of joint space narrowing, osteophytes, subchondral sclerosis and/or subchondral cysts.  This patient has knee pain which interferes with functional and activities of daily living.    This patient has experienced inadequate response, adverse effects and/or intolerance with conservative treatments such as acetaminophen , NSAIDS, topical creams, physical therapy or regular exercise, knee bracing and/or weight loss.   This patient has experienced inadequate response or has a contraindication to intra articular steroid injections for at least 3 months.   This patient is not scheduled to have a total knee replacement within 6 months of starting treatment with viscosupplementation.  PROCEDURE NOTE:  Euflexxa injection #  1 of 3   Injection 1 vial of Euflexxa into bilateral  knees  There were no vitals taken for this visit.  The bilateral knee exam: there was no synovitis or infection   The knee was prepped sterilely  Ethyl chloride was used to anesthetize the skin A 20 g needle was used to inject the knee with 1 vial of Euflexx for each knee A sterile dressing was placed  There were no complications  Follow up one week  Call if any problem.  Precautions discussed.  Electronically Signed Lemond Stable, MD 7/30/20253:25 PM

## 2023-10-22 NOTE — Addendum Note (Signed)
 Addended by: MARCINE HUSBAND T on: 10/22/2023 04:14 PM   Modules accepted: Orders

## 2023-10-24 NOTE — Progress Notes (Signed)
 Name: Whitney Bauer DOB: [birth year] MRN: [id number]  History of Present Illness: Whitney Bauer is a 68 y.o. female who presents today for follow up visit at [facility] [place].  Relevant History includes: 1. OAB with urinary frequency. - Failed Trospium .   At last visit with Dr. Sherrilee on 07/10/2022: OAB well managed on Gemtesa .  Since last visit: > 09/25/2023: Seen in ER for uncontrollable chills.  - Urinalysis showed evidence of infection - Urine culture grew >100k E. Coli and 10k Proteus mirabilis.  - Received IM Rocephin  and a prescription for Keflex .  Today: She reports that she has been off Gemtesa  for >1 year at this time because not long after she saw Dr. Sherrilee in April 2024 the medication seemed to stop helping entirely. Denies taking any medications for her bladder at this time.   She reports urinary frequency, nocturia x3, urgency, and urge incontinence. Leaking several times per day; needing to change her pads at least 3 times per day on average.  She denies significant caffeine intake.  She denies dysuria, gross hematuria, straining to void, or sensations of incomplete emptying.   Medications: Current Outpatient Medications  Medication Sig Dispense Refill   cyclobenzaprine  (FLEXERIL ) 10 MG tablet TAKE 1 TABLET BY MOUTH DAILY AS NEEDED FOR MUSCLE SPASMS. 30 tablet 3   diclofenac  (VOLTAREN ) 75 MG EC tablet Take 1 tablet (75 mg total) by mouth 2 (two) times daily with a meal. 60 tablet 2   esomeprazole (NEXIUM) 40 MG capsule Take 40 mg by mouth daily.      loperamide (IMODIUM) 1 MG/5ML solution Take 2-4 mg by mouth 4 (four) times daily as needed for diarrhea or loose stools.      ZEPBOUND 12.5 MG/0.5ML Pen Inject 12.5 mg into the skin once a week.     No current facility-administered medications for this visit.    Allergies: Allergies  Allergen Reactions   Pantoprazole  Other (See Comments)    Facial/mouth/jaw spasms (facial droop)    Past Medical  History:  Diagnosis Date   Anemia    Arthritis    bilat. knees   Back pain    Back pain    Constipation    Edema of both lower extremities    Family history of adverse reaction to anesthesia    patients mother had cardiac issues during anesthesia   Food allergy    GERD (gastroesophageal reflux disease)    Hypertension    Joint pain    Joint pain    Lactose intolerance    Rheumatoid arthritis (HCC)    Swelling of both lower extremities    Vitamin D  deficiency    Past Surgical History:  Procedure Laterality Date   BIOPSY  12/22/2019   Procedure: BIOPSY;  Surgeon: Golda Claudis PENNER, MD;  [facility]: [facility];  Service: Endoscopy;;  antral   COLONOSCOPY N/A 01/06/2014   Procedure: COLONOSCOPY;  Surgeon: Claudis PENNER Golda, MD;  Location: [facility];  Service: Endoscopy;  Laterality: N/A;  830   COLONOSCOPY WITH PROPOFOL  N/A 12/22/2019   Procedure: COLONOSCOPY WITH PROPOFOL ;  Surgeon: Golda Claudis PENNER, MD;  [facility]: [facility];  Service: Endoscopy;  Laterality: N/A;  930   ESOPHAGOGASTRODUODENOSCOPY (EGD) WITH PROPOFOL  N/A 12/22/2019   Procedure: ESOPHAGOGASTRODUODENOSCOPY (EGD) WITH PROPOFOL ;  Surgeon: Golda Claudis PENNER, MD;  Location: [facility];  Service: Endoscopy;  Laterality: N/A;   FOOT SURGERY     PARTIAL HYSTERECTOMY  jan 1999   Family History  Problem  Relation Age of Onset   Asthma Mother    Heart disease Mother    Pancreatic cancer Mother    Ovarian cancer Mother    Diabetes Mother    Stroke Mother    High blood pressure Mother    Asthma Father    Colon cancer Father    Prostate cancer Father    Stroke Father    High blood pressure Father    Asthma Sister    Cancer Paternal Grandmother        [place]   Social History   Socioeconomic History   Marital status: Single    Spouse name: Not on file   Number of children: Not on file   Years of education: Not on file   Highest education level: Not on file  Occupational History   Occupation: Air cabin crew  clker    Employer: [employer]  Tobacco Use   Smoking status: Never   Smokeless tobacco: Never  Vaping Use   Vaping status: Never Used  Substance and Sexual Activity   Alcohol use: No   Drug use: Never   Sexual activity: Not on file  Other Topics Concern   Not on file  Social History Narrative   Lives alone.    Social Drivers of Corporate investment banker Strain: Not on file  Food Insecurity: Not on file  Transportation Needs: Not on file  Physical Activity: Not on file  Stress: Not on file  Social Connections: Not on file  Intimate Partner Violence: Not on file    Review of Systems Constitutional: Patient denies any unintentional weight loss or change in strength lntegumentary: Patient denies any rashes or pruritus Cardiovascular: Patient denies chest pain or syncope Respiratory: Patient denies shortness of breath Gastrointestinal: Patient reports intermittent constipation Musculoskeletal: Patient denies muscle cramps or weakness Neurologic: Patient denies convulsions or seizures Allergic/Immunologic: Patient denies recent allergic reaction(s) Hematologic/Lymphatic: Patient denies bleeding tendencies Endocrine: Patient denies heat/cold intolerance  GU: As per HPI.  OBJECTIVE There were no vitals filed for this visit. There is no height or weight on file to calculate BMI.  Physical Examination Constitutional: No obvious distress; patient is non-toxic appearing  Cardiovascular: No visible lower extremity edema.  Respiratory: The patient does not have audible wheezing/stridor; respirations do not appear labored  Gastrointestinal: Abdomen non-distended Musculoskeletal: Normal ROM of UEs  Skin: No obvious rashes/open sores  Neurologic: CN 2-12 grossly intact Psychiatric: Answered questions appropriately with normal affect  Hematologic/Lymphatic/Immunologic: No obvious bruises or sites of spontaneous bleeding  UA: negative  PVR: 6 ml  ASSESSMENT OAB  (overactive bladder) - Plan: Urinalysis, Routine w reflex microscopic, BLADDER SCAN AMB NON-IMAGING  History of UTI  We discussed the symptoms of overactive bladder (OAB), which include urinary urgency, frequency, nocturia, with or without urge incontinence.  While we may not know the exact etiology of OAB, several risk factors can be identified.  - Patient's neurogenic risk factors: neuropathy. - Patient's exacerbating factors include: ambulatory dysfunction (functional incontinence), OSA.   We discussed the following management options in detail including potential benefits, risks, and side effects: Behavioral therapy: Modify fluid intake Minimize / avoid bladder irritants (such as caffeine, spicy foods, acidic foods, alcohol) Bladder retraining / timed voiding Double voiding Medication(s): - Not a safe candidate for anticholinergic medications due to risk for side effects based on patient's age, comorbidities, and pre-existing constipation. Also previously failed Trospium . - Beta-3 agonist medications: Failed Gemtesa .  3. For refractory cases: PTNS (posterior tibial nerve stimulation) Sacral  neuromodulation trial (Medtronic lnterStim or Axonics implant) Bladder Botox injections  She decided to proceed with PTNS. All questions were answered.   PLAN Advised the following: 1. Return for nurse visit for PTNS x12.  Orders Placed This Encounter  Procedures   Urinalysis, Routine w reflex microscopic   BLADDER SCAN AMB NON-IMAGING    It has been explained that the patient is to follow regularly with their PCP in addition to all other providers involved in their care and to follow instructions provided by these respective offices. Patient advised to contact urology clinic if any urologic-pertaining questions, concerns, new symptoms or problems arise in the interim period.  Patient Instructions  Overactive bladder (OAB) overview for patients:  Symptoms may include: urinary urgency  (gotta go feeling) urinary frequency (voiding >8 times per day) night time urination (nocturia) urge incontinence of urine (UUI)  While we do not know the exact etiology of OAB, several treatment options exist including:  Behavioral therapy: Reducing fluid intake Decreasing bladder stimulants (such as caffeine) and irritants (such as acidic food, spicy foods, alcohol) Urge suppression strategies Bladder retraining via timed voiding  Pelvic floor physical therapy  Medication(s) - can use one or both of the drug classes below. Anticholinergic / antimuscarinic medications:  Mechanism of action: Activate M3 receptors to reduce detrusor stimulation and increase bladder capacity   (parasympathetic nervous system). Effect: Relaxes the bladder to decrease overactivity, increase bladder storage capacity, and increase time between voids. Onset: Slow acting (may take 8-12 weeks to determine efficacy). Medications include: Vesicare (Solifenacin), Ditropan (Oxybutynin), Detrol (Tolterodine), Toviaz (Fesoterodine), Sanctura  (Trospium ), Urispas (Flavoxate), Enablex (Darifenacin), Bentyl (Dicyclomine), Levsin (Hyoscyamine ). Potential side effects include but are not limited to: Dry eyes, dry mouth, constipation, cognitive impairment, dementia risk with long term use, and urinary retention/ incomplete bladder emptying. Insurance companies generally prefer for patients to try 1-2 anticholinergic / antimuscarinic medications first due to low cost. Some exceptions are made based on patient-specific comorbidities / risk factors. Beta-3 agonist medications: Mechanism of action: Stimulates selective B3 adrenergic receptors to cause smooth muscle bladder relaxation (sympathetic nervous system). Effect: Relaxes the bladder to decrease overactivity, increase bladder storage capacity, and increase time between voids. Onset: Slow acting (may take 8-12 weeks to determine efficacy). Medications include: Myrbetriq  (Mirabegron) and Vibegron  (Gemtesa ). Potential side effects include but are not limited to: urinary retention / incomplete bladder emptying and elevated blood pressure (more likely to occur in individuals with pre-existing uncontrolled hypertension). These medications tend to be more expensive than the anticholinergic / antimuscarinic medications.   For patients with refractory OAB (if the above treatment options have been unsuccessful): Posterior tibial nerve stimulation (PTNS). Small acupuncture-type needle inserted near ankle with electric current to stimulate bladder via posterior tibial nerve pathway. Initially requires 12 weekly in-office treatments lasting 30 minutes each; followed by monthly in-office treatments lasting 30 minutes each for 1 year.  Bladder Botox injections. How it is done: Typically done via in-office cystoscopy; sometimes done in the OR depending on the situation. The bladder is numbed with lidocaine  instilled via a catheter. Then the urologist injects Botox into the bladder muscle wall in about 20 locations. Causes local paralysis of the bladder muscle at the injection sites to reduce bladder muscle overactivity / spasms. The effect lasts for approximately 6 months and cannot be reversed once performed. Risks may included but are not limited to: infection, incomplete bladder emptying/ urinary retention, short term need for self-catheterization or indwelling catheter, and need for repeat therapy. There is a  5-12% chance of needing to catheterize with Botox - that usually resolves in a few months as the Botox wears off. Typically Botox injections would need to be repeated every 3-12 months since this is not a permanent therapy.  Sacral neuromodulation trial (Medtronic lnterStim or Axonics implant). Sacral neuromodulation is FDA-approved for uncontrolled urinary urgency, urinary frequency, urinary urge incontinence, non-obstructive urinary retention, or fecal incontinence. It is  not FDA-approved as a treatment for pain. The goal of this therapy is at least a 50% improvement in symptoms. It is NOT realistic to expect a 100% cure. This is a a 2-step outpatient procedure. After a successful test period, a permanent wire and generator are placed in the OR. We discussed the risk of infection. We reviewed the fact that about 30% of patients fail the test phase and are not candidates for permanent generator placement. During the 1-2 week trial phase, symptoms are documented by the patient to determine response. If patient gets at least a 50% improvement in symptoms, they may then proceed with Step 2. Step 1: Trial lead placement. Per physician discretion, may done one of two ways: Percutaneous nerve evaluation (PNE) in the [facility] urology office. Performed by urologist under local anesthesia (numbing the area with lidocaine ) using a spinal needle for placement of test wire, which usually stays in place for 5-7 days to determine therapy response. Test lead placement in OR under anesthesia. Usually stays in place 2 weeks to determine therapy response. > Step 2: Permanent implantation of sacral neuromodulation device, which is performed in the OR.  Sacral neuromodulation implants: All are conditionally MRI safe. Manufacturer: Medtronic Website: BuffaloDryCleaner.gl therapy/right-for-you.html Options: lnterStim X: Non-rechargeable. The battery lasts 10 years on average. lnterStim Micro: Rechargeable. The battery lasts 15 years on average and must be charged routinely. Approximately 50% smaller implant than lnterStim X implant.  Manufacturer: Axonics Website: Findrealrelief.axonics.com Options: Non-rechargeable (Axonics F15): The battery lasts 15 years on average. Rechargeable (Axonics R20): The battery lasts 20 years on average and must be charged in office for about 1 hour every 6-10 months on average.  Approximately 50% smaller implant than Axonics non-rechargeable implant.  Note: Generally the rechargeable devices are only advised for very small or thin patients who may not have sufficient adipose tissue to comfortably overlay the implanted device.  Suprapubic catheter (SP tube) placement. Only done in severely refractory OAB when all other options have failed or are not a viable treatment choice depending on patient factors. Involves placement of a catheter through the lower abdomen into the bladder to continuously drain the bladder into an external collection bag, which patient can then empty at their convenience every few hours. Done via an outpatient surgical procedure in the OR under anesthesia. Risks may included but are not limited to: surgical site pain, infections, skin irritation / breakdown, chronic bacteriuria, symptomatic UTls. The SP tube must stay in place continuously. This is a reversible procedure however - the insertion site will close if catheter is removed for more than a few hours. The SP tube must be exchanged routinely every 4 weeks to prevent the catheter from becoming clogged with sediment. SP tube exchanges are typically performed at a urology nurse visit or by a home health nurse.        UTI prevention / management:  UTI symptoms may include:  - Pain / burning / discomfort when urinating - Recent increase in urinary urgency (how quickly you feel like you need to rush to the bathroom) - Recent increase in urinary  frequency (how often you are urinating) - Fever - Acute mental status change / confusion - Fatigue / Feeling tired - Weakness - Note: Urine color, clarity, and odor are not considered to be clinically significant indicators of UTI and do not warrant urine testing unless patient is also experiencing UTI symptoms such as those listed above.  Difference between Urinalysis (urine dipstick test) and Urine culture / Why urine culture often needed to  determine appropriate diagnosis and treatment of urologic symptoms: > Urinalysis (urine dipstick test): A quick office test used as an indicator to determine whether or not further testing is necessary (such as a urine culture, urine microscopy, etc.) The urinalysis cannot differentiate a true bacterial UTI or give a definitive diagnosis for the findings.  > Urine culture: May be performed based on the findings of a urinalysis to evaluate for UTI. Grows out on a petri dish for 48-72 hours. Provides important information about: whether or not bacterial growth is present and if so: what the predominant bacteria is which antibiotics will work best against that bacteria That information is important so that we can diagnose and treat patients appropriately as there are other conditions which may mimic UTls which must not be missed (such as cancer, interstitial cystitis, stones, etc.). Assists us  with antibiotic stewardship to minimize patient's risk for developing antibiotic resistance (getting to a point where no antibiotics work anymore).  Options when UTI symptoms occur: 1. Call [facility] [place] and request to speak with triage nurse (phone # [phone], select option 3). In accordance with clinic guidelines the nurse will determine next steps based on patient-reported symptoms, which may include: same-day lab visit to provide urine specimen, recommendation to schedule Urology office visit appointment for further evaluation, recommendation to proceed to ER, etc. 2. Call your Primary Care Provider (PCP) office to request urgent / same-day visit. Be sure to request for urine culture to be ordered and have results faxed to Urology (fax # [phone]).  3. Go to urgent care. Be sure to request for urine culture to be ordered and have results faxed to Urology (fax # [phone]).   For bladder pain/ burning with urination: - Can take over-the-counter Pyridium (phenazopyridine;  commonly known under the AZO brand) for a few days as needed. Limit use to no more than 3 days consecutively due to risk for methemoglobinemia, liver function issues, and bone health damage with long term use of Pyridium. - Alternative: Prescription urinary analgesics (such as Uribel, Urogesic blue, Urelle, Uro-MP). Often expensive / poorly covered by insurance unfortunately.  Options / recommendations for UTI prevention: - Topical vaginal estrogen for vaginal atrophy (aka Genitourinary Syndrome of Menopause (GSM)). - Adequate daily fluid intake to flush out the urinary tract. - Go to the bathroom to urinate every 4-6 hours while awake to minimize urinary stasis / bacterial overgrowth in the bladder. - Proanthocyanidin (PAC) supplement 36 mg daily; must be soluble (insoluble form of PAC will be ineffective). Recommended brand: Ellura. This is an over-the-counter supplement (often must be found/ purchased online) supplement derived from cranberries with concentrated active component: Proanthocyanidin (PAC) 36 mg daily. Decreases bacterial adherence to bladder lining.  - D-mannose powder (2 grams daily). This is an over-the-counter supplement which decreases bacterial adherence to bladder lining (it is a sugar that inhibits bacterial adherence to urothelial cells by binding to the pili of enteric bacteria). Take as per manufacturer recommendation. Can be used as an alternative or in addition to the concentrated cranberry supplement.  - Vitamin  C supplement to acidify urine to minimize bacterial growth.  - Probiotic to maintain healthy vaginal microbiome to suppress bacteria at urethral opening. Brand recommendations: Verlon (includes probiotic & D-mannose ), Feminine Balance (highest concentration of lactobacillus) or Hyperbiotic Pro 15.  Note for patients with diabetes:  - Be aware that D-mannose contains sugar.  Note for patients with interstitial cystitis (IC):  - Patients with IC should typically  avoid cranberry/ PAC supplements and Vitamin C supplements due to their acidity, which may exacerbate IC-related bladder pain. - Symptoms of true bacterial UTI can overlap / mimic symptoms of an IC flare up. Antibiotic use is NOT indicated for IC flare ups. Urine culture needed prior to antibiotic treatment for IC patients. The goal is to minimize your risk for developing antibiotic-resistant bacteria.    Vaginal atrophy I Genitourinary syndrome of menopause (GSM):  What it is: Changes in the vaginal environment (including the vulva and urethra) including: Thinning of the epithelium (skin/ mucosa surface) Can contribute to urinary urgency and frequency Can contribute to dryness, itching, irritation of the vulvar and vaginal tissue Can contribute to pain with intercourse Can contribute to physical changes of the labia, vulva, and vagina such as: Narrowing of the vaginal opening Decreased vaginal length Loss of labial architecture Labial adhesions Pale color of vulvovaginal tissue Loss of pubic hair Allows bacteria to become adherent Results in increased risk for urinary tract infection (UTI) due to bacterial overgrowth and migration up the urethra into the bladder Change in vaginal pH (acid/ base balance) Allows for alteration / disruption of the normal bacterial flora / microbiome Results in increased risk for urinary tract infection (UTI) due to bacterial overgrowth  Treatment options: Over-the-counter lubricants (see list below). Prescription vaginal estrogen replacement. Options: Topical vaginal estrogen (estradiol) cream: (Estrace, Premarin, compounded) Apply as directed Estring vaginal ring Exchanged every 3 months (either at home or in office by provider) Vagifem vaginal tablet Inserted nightly for 2 weeks then twice a week (long term) lntrarosa vaginal suppository Vaginal DHEA: converts to estrogen in vaginal tissue without systemic effect Inserted nightly (long term) 3.  Vaginal laser therapy (Mona Olam touch) Performed in 3 treatments each 6 weeks apart (available in our [place] office). Can feel like a sunburn for 3-4 days after each treatment until new skin heals in. Usually not covered by insurance. Estimated cost is $1500 for all 3 sessions.  FYI regarding prescription vaginal estrogen treatment options: - All topical vaginal estrogen replacement options are equivalent in terms of efficacy. - Topical vaginal estrogen replacement will take about 3 months to be effective. - OK to have sex with any of the topical vaginal estrogen replacement options. - Topical vaginal estrogen replacement may sting/burn initially due to severe dryness, which will improve with ongoing treatment. - Studies have demonstrated negligible systemic absorption of low-dose intravaginal estrogen cream therefore the risk for cancer development or recurrence with this medication is minimal.  Genitourinary Syndrome of Menopause: AUA/SUFU/AUGS Guideline (2025) [place]Info.at  Topical vaginal estrogen cream safe to use with breast cancer history WomenInsider.com.ee  Topical vaginal estrogen cream safe to use with blood clot history [email or web address]   Lubricants and Moisturizers for Treating Genitourinary Syndrome of Menopause and Vulvovaginal Atrophy Treatment Comments I Available Products   Lubricants   Water -based Ingredients: Deionized water , glycerin, propylene glycol; latex safe; rare irritation; dry out with extended sexual activity Astroglide, Good Clean Love, K-Y Jelly, Natural, Organic, Pink, Sliquid, Sylk, Yes    Oil Based Ingredients: avocado, olive,  peanut, corn; latex safe;  can be used with silicone products; staining; safe (unless peanut allergy); non-irritating Coconut oil, vegetable oil, vitamin E oil  Silicone-Based Ingredients: Silicone polymers; staining; typically nonirritating, long lasting; waterproof; should not be used with silicone dilators, sexual toys, or gynecologic products Astroglide X, Oceanus Ultra Pure, Pink Silicone, Pjur Eros, Replens Silky Smooth, Silicone Premium JO, SKYN, Uberlube, [employer] Based Minimize harm to sperm motility; designed for couples trying to conceive:  Astroglide TTC, Conceive Plus, PreSeed, Yes Baby  Fertility Friendly Minimize harm to sperm motility; designed for couples trying to conceive:  Astroglide, TTC, Conceive Plus, PreSeed, Yes Baby  Vaginal Moisturizers   Vaginal Moisturizers For maintenance use 1 to 3 times weekly; can benefit women with dryness, chafing with AOL, and recurrent vaginal infections irrespective of sexual activity timing Balance Active Menopause Vaginal Moisturizing Lubricant, Canesintima Intimate Moisturizer, Replens, Rephresh, Sylk Natural Intimate Moisturizer, Yes Vaginal Moisturizer  Hybrids Properties of both water  and silicone-based products (combination of a vaginal lubricant and moisturizer); Non-irritating; good option for women with allergies and sensitivities Lubrigyn, Luvena  Suppositories Hyaluronic acid to retain moisture Revaree  Vulvar Soothing Creams/Oils    Medicated Creams Pain and burn relief; Ingredients: 4% Lidocaine , Aloe Vera gel Releveum ([place])  Non-Medicated Creams For anti-itch and moisture/maintenance; Ingredients: Coconut oil, Avocado oil, Shea Butter, Olive oil, Vitamin E Vajuvenate, Vmagic  Oils for moisture/maintenance: Coconut oil, Vitamin E oil, Emu oil      Electronically signed by:  Lauraine JAYSON Oz, FNP   10/27/23    2:35 PM

## 2023-10-27 ENCOUNTER — Encounter: Payer: Self-pay | Admitting: Urology

## 2023-10-27 ENCOUNTER — Ambulatory Visit: Admitting: Urology

## 2023-10-27 DIAGNOSIS — Z87440 Personal history of urinary (tract) infections: Secondary | ICD-10-CM

## 2023-10-27 DIAGNOSIS — N3281 Overactive bladder: Secondary | ICD-10-CM

## 2023-10-27 LAB — URINALYSIS, ROUTINE W REFLEX MICROSCOPIC
Bilirubin, UA: NEGATIVE
Glucose, UA: NEGATIVE
Ketones, UA: NEGATIVE
Leukocytes,UA: NEGATIVE
Nitrite, UA: NEGATIVE
Protein,UA: NEGATIVE
RBC, UA: NEGATIVE
Specific Gravity, UA: 1.015 (ref 1.005–1.030)
Urobilinogen, Ur: 0.2 mg/dL (ref 0.2–1.0)
pH, UA: 6 (ref 5.0–7.5)

## 2023-10-27 NOTE — Progress Notes (Signed)
 Bladder Scan completed today.  Patient can void prior to the bladder scan. Bladder scan result: 6  Performed By: Alfonse Spruce. CMA  Additional notes-

## 2023-10-27 NOTE — Patient Instructions (Addendum)
 Overactive bladder (OAB) overview for patients:  Symptoms may include: urinary urgency (gotta go feeling) urinary frequency (voiding >8 times per day) night time urination (nocturia) urge incontinence of urine (UUI)  While we do not know the exact etiology of OAB, several treatment options exist including:  Behavioral therapy: Reducing fluid intake Decreasing bladder stimulants (such as caffeine) and irritants (such as acidic food, spicy foods, alcohol) Urge suppression strategies Bladder retraining via timed voiding  Pelvic floor physical therapy  Medication(s) - can use one or both of the drug classes below. Anticholinergic / antimuscarinic medications:  Mechanism of action: Activate M3 receptors to reduce detrusor stimulation and increase bladder capacity  (parasympathetic nervous system). Effect: Relaxes the bladder to decrease overactivity, increase bladder storage capacity, and increase time between voids. Onset: Slow acting (may take 8-12 weeks to determine efficacy). Medications include: Vesicare (Solifenacin), Ditropan (Oxybutynin), Detrol (Tolterodine), Toviaz (Fesoterodine), Sanctura (Trospium), Urispas (Flavoxate), Enablex (Darifenacin), Bentyl (Dicyclomine), Levsin (Hyoscyamine ). Potential side effects include but are not limited to: Dry eyes, dry mouth, constipation, cognitive impairment, dementia risk with long term use, and urinary retention/ incomplete bladder emptying. Insurance companies generally prefer for patients to try 1-2 anticholinergic / antimuscarinic medications first due to low cost. Some exceptions are made based on patient-specific comorbidities / risk factors. Beta-3 agonist medications: Mechanism of action: Stimulates selective B3 adrenergic receptors to cause smooth muscle bladder relaxation (sympathetic nervous system). Effect: Relaxes the bladder to decrease overactivity, increase bladder storage capacity, and increase time between  voids. Onset: Slow acting (may take 8-12 weeks to determine efficacy). Medications include: Myrbetriq (Mirabegron) and Vibegron Andris). Potential side effects include but are not limited to: urinary retention / incomplete bladder emptying and elevated blood pressure (more likely to occur in individuals with pre-existing uncontrolled hypertension). These medications tend to be more expensive than the anticholinergic / antimuscarinic medications.   For patients with refractory OAB (if the above treatment options have been unsuccessful): Posterior tibial nerve stimulation (PTNS). Small acupuncture-type needle inserted near ankle with electric current to stimulate bladder via posterior tibial nerve pathway. Initially requires 12 weekly in-office treatments lasting 30 minutes each; followed by monthly in-office treatments lasting 30 minutes each for 1 year.  Bladder Botox injections. How it is done: Typically done via in-office cystoscopy; sometimes done in the OR depending on the situation. The bladder is numbed with lidocaine  instilled via a catheter. Then the urologist injects Botox into the bladder muscle wall in about 20 locations. Causes local paralysis of the bladder muscle at the injection sites to reduce bladder muscle overactivity / spasms. The effect lasts for approximately 6 months and cannot be reversed once performed. Risks may included but are not limited to: infection, incomplete bladder emptying/ urinary retention, short term need for self-catheterization or indwelling catheter, and need for repeat therapy. There is a 5-12% chance of needing to catheterize with Botox - that usually resolves in a few months as the Botox wears off. Typically Botox injections would need to be repeated every 3-12 months since this is not a permanent therapy.  Sacral neuromodulation trial (Medtronic lnterStim or Axonics implant). Sacral neuromodulation is FDA-approved for uncontrolled urinary urgency,  urinary frequency, urinary urge incontinence, non-obstructive urinary retention, or fecal incontinence. It is not FDA-approved as a treatment for pain. The goal of this therapy is at least a 50% improvement in symptoms. It is NOT realistic to expect a 100% cure. This is a a 2-step outpatient procedure. After a successful test period,  a permanent wire and generator are placed in the OR. We discussed the risk of infection. We reviewed the fact that about 30% of patients fail the test phase and are not candidates for permanent generator placement. During the 1-2 week trial phase, symptoms are documented by the patient to determine response. If patient gets at least a 50% improvement in symptoms, they may then proceed with Step 2. Step 1: Trial lead placement. Per physician discretion, may done one of two ways: Percutaneous nerve evaluation (PNE) in the [facility] urology office. Performed by urologist under local anesthesia (numbing the area with lidocaine ) using a spinal needle for placement of test wire, which usually stays in place for 5-7 days to determine therapy response. Test lead placement in OR under anesthesia. Usually stays in place 2 weeks to determine therapy response. > Step 2: Permanent implantation of sacral neuromodulation device, which is performed in the OR.  Sacral neuromodulation implants: All are conditionally MRI safe. Manufacturer: Medtronic Website: [employer]DryCleaner.gl therapy/right-for-you.html Options: lnterStim X: Non-rechargeable. The battery lasts 10 years on average. lnterStim Micro: Rechargeable. The battery lasts 15 years on average and must be charged routinely. Approximately 50% smaller implant than lnterStim X implant.  Manufacturer: Axonics Website: Findrealrelief.axonics.com Options: Non-rechargeable (Axonics F15): The battery lasts 15 years on average. Rechargeable (Axonics R20): The  battery lasts 20 years on average and must be charged in office for about 1 hour every 6-10 months on average. Approximately 50% smaller implant than Axonics non-rechargeable implant.  Note: Generally the rechargeable devices are only advised for very small or thin patients who may not have sufficient adipose tissue to comfortably overlay the implanted device.  Suprapubic catheter (SP tube) placement. Only done in severely refractory OAB when all other options have failed or are not a viable treatment choice depending on patient factors. Involves placement of a catheter through the lower abdomen into the bladder to continuously drain the bladder into an external collection bag, which patient can then empty at their convenience every few hours. Done via an outpatient surgical procedure in the OR under anesthesia. Risks may included but are not limited to: surgical site pain, infections, skin irritation / breakdown, chronic bacteriuria, symptomatic UTls. The SP tube must stay in place continuously. This is a reversible procedure however - the insertion site will close if catheter is removed for more than a few hours. The SP tube must be exchanged routinely every 4 weeks to prevent the catheter from becoming clogged with sediment. SP tube exchanges are typically performed at a urology nurse visit or by a home health nurse.       UTI prevention / management:  UTI symptoms may include:  - Pain / burning / discomfort when urinating - Recent increase in urinary urgency (how quickly you feel like you need to rush to the bathroom) - Recent increase in urinary frequency (how often you are urinating) - Fever - Acute mental status change / confusion - Fatigue / Feeling tired - Weakness - Note: Urine color, clarity, and odor are not considered to be clinically significant indicators of UTI and do not warrant urine testing unless patient is also experiencing UTI symptoms such as those listed  above.  Difference between Urinalysis (urine dipstick test) and Urine culture / Why urine culture often needed to determine appropriate diagnosis and treatment of urologic symptoms: > Urinalysis (urine dipstick test): A quick office test used as an indicator to determine whether or not further testing is necessary (such as a urine culture, urine microscopy,  etc.) The urinalysis cannot differentiate a true bacterial UTI or give a definitive diagnosis for the findings.  > Urine culture: May be performed based on the findings of a urinalysis to evaluate for UTI. Grows out on a petri dish for 48-72 hours. Provides important information about: whether or not bacterial growth is present and if so: what the predominant bacteria is which antibiotics will work best against that bacteria That information is important so that we can diagnose and treat patients appropriately as there are other conditions which may mimic UTls which must not be missed (such as cancer, interstitial cystitis, stones, etc.). Assists us  with antibiotic stewardship to minimize patient's risk for developing antibiotic resistance (getting to a point where no antibiotics work anymore).  Options when UTI symptoms occur: 1. Call [facility] [place] and request to speak with triage nurse (phone # [phone], select option 3). In accordance with clinic guidelines the nurse will determine next steps based on patient-reported symptoms, which may include: same-day lab visit to provide urine specimen, recommendation to schedule Urology office visit appointment for further evaluation, recommendation to proceed to ER, etc. 2. Call your Primary Care Provider (PCP) office to request urgent / same-day visit. Be sure to request for urine culture to be ordered and have results faxed to Urology (fax # [phone]).  3. Go to urgent care. Be sure to request for urine culture to be ordered and have results faxed to Urology (fax #  [phone]).   For bladder pain/ burning with urination: - Can take over-the-counter Pyridium (phenazopyridine; commonly known under the AZO brand) for a few days as needed. Limit use to no more than 3 days consecutively due to risk for methemoglobinemia, liver function issues, and bone health damage with long term use of Pyridium. - Alternative: Prescription urinary analgesics (such as Uribel, Urogesic blue, Urelle, Uro-MP). Often expensive / poorly covered by insurance unfortunately.  Options / recommendations for UTI prevention: - Topical vaginal estrogen for vaginal atrophy (aka Genitourinary Syndrome of Menopause (GSM)). - Adequate daily fluid intake to flush out the urinary tract. - Go to the bathroom to urinate every 4-6 hours while awake to minimize urinary stasis / bacterial overgrowth in the bladder. - Proanthocyanidin (PAC) supplement 36 mg daily; must be soluble (insoluble form of PAC will be ineffective). Recommended brand: Ellura. This is an over-the-counter supplement (often must be found/ purchased online) supplement derived from cranberries with concentrated active component: Proanthocyanidin (PAC) 36 mg daily. Decreases bacterial adherence to bladder lining.  - D-mannose powder (2 grams daily). This is an over-the-counter supplement which decreases bacterial adherence to bladder lining (it is a sugar that inhibits bacterial adherence to urothelial cells by binding to the pili of enteric bacteria). Take as per manufacturer recommendation. Can be used as an alternative or in addition to the concentrated cranberry supplement.  - Vitamin C supplement to acidify urine to minimize bacterial growth.  - Probiotic to maintain healthy vaginal microbiome to suppress bacteria at urethral opening. Brand recommendations: Verlon (includes probiotic & D-mannose ), Feminine Balance (highest concentration of lactobacillus) or Hyperbiotic Pro 15.  Note for patients with diabetes:  - Be aware that  D-mannose contains sugar.  Note for patients with interstitial cystitis (IC):  - Patients with IC should typically avoid cranberry/ PAC supplements and Vitamin C supplements due to their acidity, which may exacerbate IC-related bladder pain. - Symptoms of true bacterial UTI can overlap / mimic symptoms of an IC flare up. Antibiotic use is NOT indicated for IC  flare ups. Urine culture needed prior to antibiotic treatment for IC patients. The goal is to minimize your risk for developing antibiotic-resistant bacteria.    Vaginal atrophy I Genitourinary syndrome of menopause (GSM):  What it is: Changes in the vaginal environment (including the vulva and urethra) including: Thinning of the epithelium (skin/ mucosa surface) Can contribute to urinary urgency and frequency Can contribute to dryness, itching, irritation of the vulvar and vaginal tissue Can contribute to pain with intercourse Can contribute to physical changes of the labia, vulva, and vagina such as: Narrowing of the vaginal opening Decreased vaginal length Loss of labial architecture Labial adhesions Pale color of vulvovaginal tissue Loss of pubic hair Allows bacteria to become adherent Results in increased risk for urinary tract infection (UTI) due to bacterial overgrowth and migration up the urethra into the bladder Change in vaginal pH (acid/ base balance) Allows for alteration / disruption of the normal bacterial flora / microbiome Results in increased risk for urinary tract infection (UTI) due to bacterial overgrowth  Treatment options: Over-the-counter lubricants (see list below). Prescription vaginal estrogen replacement. Options: Topical vaginal estrogen (estradiol ) cream: (Estrace , Premarin, compounded) Apply as directed Estring  vaginal ring Exchanged every 3 months (either at home or in office by provider) Vagifem  vaginal tablet Inserted nightly for 2 weeks then twice a week (long term) lntrarosa vaginal  suppository Vaginal DHEA: converts to estrogen in vaginal tissue without systemic effect Inserted nightly (long term) 3. Vaginal laser therapy (Mona Olam touch) Performed in 3 treatments each 6 weeks apart (available in our [place] office). Can feel like a sunburn for 3-4 days after each treatment until new skin heals in. Usually not covered by insurance. Estimated cost is $1500 for all 3 sessions.  FYI regarding prescription vaginal estrogen treatment options: - All topical vaginal estrogen replacement options are equivalent in terms of efficacy. - Topical vaginal estrogen replacement will take about 3 months to be effective. - OK to have sex with any of the topical vaginal estrogen replacement options. - Topical vaginal estrogen replacement may sting/burn initially due to severe dryness, which will improve with ongoing treatment. - Studies have demonstrated negligible systemic absorption of low-dose intravaginal estrogen cream therefore the risk for cancer development or recurrence with this medication is minimal.  Genitourinary Syndrome of Menopause: AUA/SUFU/AUGS Guideline (2025) [place]Info.at  Topical vaginal estrogen cream safe to use with breast cancer history WomenInsider.com.ee  Topical vaginal estrogen cream safe to use with blood clot history [email or web address]   Lubricants and Moisturizers for Treating Genitourinary Syndrome of Menopause and Vulvovaginal Atrophy Treatment Comments I Available Products   Lubricants   Water-based Ingredients: Deionized water, glycerin, propylene glycol; latex safe; rare irritation; dry out with extended sexual activity  Astroglide, Good Clean Love, K-Y Jelly, Natural, Organic, Pink, Sliquid, Sylk, Yes    Oil Based Ingredients: avocado, olive, peanut, corn; latex safe; can be used with silicone products; staining; safe (unless peanut allergy); non-irritating Coconut oil, vegetable oil, vitamin E oil  Silicone-Based Ingredients: Silicone polymers; staining; typically nonirritating, long lasting; waterproof; should not be used with silicone dilators, sexual toys, or gynecologic products Astroglide X, Oceanus Ultra Pure, Pink Silicone, Pjur Eros, Replens Silky Smooth, Silicone Premium JO, SKYN, Uberlube, [employer] Based Minimize harm to sperm motility; designed for couples trying to conceive:  Astroglide TTC, Conceive Plus, PreSeed, Yes Baby  Fertility Friendly Minimize harm to sperm motility; designed for couples trying to conceive:  Astroglide, TTC, Conceive Plus, PreSeed, Yes Baby  Vaginal Moisturizers   Vaginal Moisturizers  For maintenance use 1 to 3 times weekly; can benefit women with dryness, chafing with AOL, and recurrent vaginal infections irrespective of sexual activity timing Balance Active Menopause Vaginal Moisturizing Lubricant, Canesintima Intimate Moisturizer, Replens, Rephresh, Sylk Natural Intimate Moisturizer, Yes Vaginal Moisturizer  Hybrids Properties of both water and silicone-based products (combination of a vaginal lubricant and moisturizer); Non-irritating; good option for women with allergies and sensitivities Lubrigyn, Luvena  Suppositories Hyaluronic acid to retain moisture Revaree  Vulvar Soothing Creams/Oils    Medicated Creams Pain and burn relief; Ingredients: 4% Lidocaine , Aloe Vera gel Releveum ([place])  Non-Medicated Creams For anti-itch and moisture/maintenance; Ingredients: Coconut oil, Avocado oil, Shea Butter, Olive oil, Vitamin E Vajuvenate, Vmagic  Oils for moisture/maintenance: Coconut oil, Vitamin E oil, Emu oil

## 2023-10-29 ENCOUNTER — Ambulatory Visit: Admitting: Orthopaedic Surgery

## 2023-10-29 ENCOUNTER — Encounter: Payer: Self-pay | Admitting: Orthopaedic Surgery

## 2023-10-29 DIAGNOSIS — M170 Bilateral primary osteoarthritis of knee: Secondary | ICD-10-CM | POA: Diagnosis not present

## 2023-10-29 DIAGNOSIS — G8929 Other chronic pain: Secondary | ICD-10-CM

## 2023-10-29 DIAGNOSIS — Z6841 Body Mass Index (BMI) 40.0 and over, adult: Secondary | ICD-10-CM

## 2023-10-29 MED ORDER — SODIUM HYALURONATE (VISCOSUP) 20 MG/2ML IX SOSY
20.0000 mg | PREFILLED_SYRINGE | Freq: Once | INTRA_ARTICULAR | Status: AC
  Administered 2023-10-29: 20 mg via INTRA_ARTICULAR

## 2023-10-29 NOTE — Progress Notes (Signed)
 This patient is diagnosed with osteoarthritis of the knee(s).    Radiographs show evidence of joint space narrowing, osteophytes, subchondral sclerosis and/or subchondral cysts.  This patient has knee pain which interferes with functional and activities of daily living.    This patient has experienced inadequate response, adverse effects and/or intolerance with conservative treatments such as acetaminophen , NSAIDS, topical creams, physical therapy or regular exercise, knee bracing and/or weight loss.   This patient has experienced inadequate response or has a contraindication to intra articular steroid injections for at least 3 months.   This patient is not scheduled to have a total knee replacement within 6 months of starting treatment with viscosupplementation.  PROCEDURE NOTE:  Euflexxa injection #  2 of 3   Injection 1 vial of Euflexxa into bilateral  knees  There were no vitals taken for this visit.  The bilateral knee exam: there was no synovitis or infection   The knee was prepped sterilely  Ethyl chloride was used to anesthetize the skin A 20 g needle was used to inject the knee with 1 vial of Euflexxa each knee A sterile dressing was placed  There were no complications  Encounter Diagnoses  Name Primary?   Bilateral primary osteoarthritis of knee Yes   Chronic pain of left knee    Chronic pain of right knee    Body mass index 45.0-49.9, adult (HCC)    Morbid obesity (HCC)     Follow up one week  Call if any problem.  Precautions discussed.  Electronically Signed Lemond Stable, MD 8/6/20251:43 PM

## 2023-11-05 ENCOUNTER — Encounter: Payer: Self-pay | Admitting: Orthopaedic Surgery

## 2023-11-05 ENCOUNTER — Ambulatory Visit: Admitting: Orthopaedic Surgery

## 2023-11-05 DIAGNOSIS — M4316 Spondylolisthesis, lumbar region: Secondary | ICD-10-CM

## 2023-11-05 DIAGNOSIS — M170 Bilateral primary osteoarthritis of knee: Secondary | ICD-10-CM | POA: Diagnosis not present

## 2023-11-05 DIAGNOSIS — Z6841 Body Mass Index (BMI) 40.0 and over, adult: Secondary | ICD-10-CM

## 2023-11-05 DIAGNOSIS — G8929 Other chronic pain: Secondary | ICD-10-CM

## 2023-11-05 MED ORDER — HYDROCODONE-ACETAMINOPHEN 5-325 MG PO TABS
1.0000 | ORAL_TABLET | ORAL | 0 refills | Status: AC | PRN
Start: 2023-11-05 — End: 2023-11-10

## 2023-11-05 MED ORDER — SODIUM HYALURONATE (VISCOSUP) 20 MG/2ML IX SOSY
20.0000 mg | PREFILLED_SYRINGE | Freq: Once | INTRA_ARTICULAR | Status: AC
  Administered 2023-11-05 (×2): 20 mg via INTRA_ARTICULAR

## 2023-11-05 NOTE — Progress Notes (Signed)
 This patient is diagnosed with osteoarthritis of the knee(s).    Radiographs show evidence of joint space narrowing, osteophytes, subchondral sclerosis and/or subchondral cysts.  This patient has knee pain which interferes with functional and activities of daily living.    This patient has experienced inadequate response, adverse effects and/or intolerance with conservative treatments such as acetaminophen , NSAIDS, topical creams, physical therapy or regular exercise, knee bracing and/or weight loss.   This patient has experienced inadequate response or has a contraindication to intra articular steroid injections for at least 3 months.   This patient is not scheduled to have a total knee replacement within 6 months of starting treatment with viscosupplementation.  PROCEDURE NOTE:  Euflexxa injection #  3 of 3   Injection 1 vial of Euflexxa into bilateral  knees  There were no vitals taken for this visit.  The bilateral knee exam: there was no synovitis or infection   The knee was prepped sterilely  Ethyl chloride was used to anesthetize the skin A 20 g needle was used to inject the knee with 1 vial of Euflexxa individually to both knees A sterile dressing was placed  There were no complications  Encounter Diagnoses  Name Primary?   Spondylolisthesis at L3-L4 level Yes   Bilateral primary osteoarthritis of knee    Chronic pain of left knee    Chronic pain of right knee    Body mass index 45.0-49.9, adult (HCC)    Morbid obesity (HCC)     Follow up one month  I will give appointment to see Dr. Eldonna.  She had nerve ablation by Dr. Leeann in 2003.  She may need this again.  Call if any problem.  Precautions discussed.  Electronically Signed Lemond Stable, MD 8/13/20252:33 PM

## 2023-11-14 ENCOUNTER — Encounter: Payer: Self-pay | Admitting: Radiology

## 2023-11-14 ENCOUNTER — Ambulatory Visit: Admitting: Urology

## 2023-11-19 ENCOUNTER — Encounter: Payer: Self-pay | Admitting: Physical Medicine and Rehabilitation

## 2023-11-19 ENCOUNTER — Ambulatory Visit: Admitting: Physical Medicine and Rehabilitation

## 2023-11-19 DIAGNOSIS — M7918 Myalgia, other site: Secondary | ICD-10-CM | POA: Diagnosis not present

## 2023-11-19 DIAGNOSIS — M79604 Pain in right leg: Secondary | ICD-10-CM | POA: Diagnosis not present

## 2023-11-19 DIAGNOSIS — M5416 Radiculopathy, lumbar region: Secondary | ICD-10-CM | POA: Diagnosis not present

## 2023-11-19 NOTE — Progress Notes (Unsigned)
 Pain Scale   Average Pain 2 Patient advised she has chronic lower back pain radiating to right knee        +Driver, -BT, -Dye Allergies.

## 2023-11-19 NOTE — Progress Notes (Unsigned)
 Whitney Bauer - 68 y.o. female MRN [id number]  Date of birth: 1956/02/17  Office Visit Note: Visit Date: 11/19/2023 PCP: Sheryle Carwin, MD Referred by: Sheryle Carwin, MD  Subjective: Chief Complaint  Patient presents with   Lower Back - Pain   HPI: Whitney Bauer is a 68 y.o. female who comes in today per the request of Dr. Lemond Stable for evaluation of acute right posterior thigh pain. Pain started about 2 weeks ago. No lower back pain. Duration of pain was about 3 days that has now resolved. She is concerned this pain was caused by sitting on rolling walker. Significant relief of pain with an over the counter medication called Nervive. She continues to be active and is able to perform functional abilities. Lumbar radiographs from 2022 show multi level degenerative changes. She was previously treated by Dr. Leeann, history of lumbar radiofrequency many years ago with good relief of pain. Patient is currently working full time at [employer]. Patient denies focal weakness. No recent trauma or falls.      Review of Systems  Musculoskeletal:  Negative for back pain.  Neurological:  Negative for tingling, sensory change, focal weakness and weakness.  All other systems reviewed and are negative.  Otherwise per HPI.  Assessment & Plan: Visit Diagnoses:    ICD-10-CM   1. Lumbar radiculopathy  M54.16     2. Pain in right leg  M79.604     3. Myofascial pain syndrome  M79.18        Plan: Findings:  Acute right posterior thigh pain. Significant and sustained relief of pain with conservative therapies and over the counter medication. Patients clinical presentation and exam are consistent with acute myofascial strain. We discussed treatment plan in detail. I do think she would benefit from short course of formal physical therapy with a focus on core strengthening, gait and posture. She would like to hold on PT at this time. We are happy to see her back for lower back issues. Would recommend  obtaining lumbar MRI imaging at some point. I encouraged her to remain active as tolerated. No red flag symptoms noted upon exam today.     Meds & Orders: No orders of the defined types were placed in this encounter.  No orders of the defined types were placed in this encounter.   Follow-up: Return if symptoms worsen or fail to improve.   Procedures: No procedures performed      Clinical History: Clinical: pain right hip, no trauma   X-rays were done of the right hip, two views.   There is mild degenerative changes of the right hip, no fracture is noted. Bone quality is good.   Impression:  Mild degenerative changes of right hip.   Electronically Signed Lemond Stable, MD 3/15/20223:39 PM   She reports that she has never smoked. She has never used smokeless tobacco. No results for input(s): HGBA1C, LABURIC in the last 8760 hours.  Objective:  VS:  HT:    WT:   BMI:     BP:   HR: bpm  TEMP: ( )  RESP:  Physical Exam Vitals and nursing note reviewed.  HENT:     Head: Normocephalic and atraumatic.     Right Ear: External ear normal.     Left Ear: External ear normal.     Nose: Nose normal.     Mouth/Throat:     Mouth: Mucous membranes are moist.  Eyes:     Extraocular Movements: Extraocular  movements intact.  Cardiovascular:     Rate and Rhythm: Normal rate.     Pulses: Normal pulses.  Pulmonary:     Effort: Pulmonary effort is normal.  Abdominal:     General: Abdomen is flat. There is no distension.  Musculoskeletal:        General: Normal range of motion.     Cervical back: Normal range of motion.     Comments: Patient is slow to rise from seated position to standing. Good lumbar range of motion. No pain noted with facet loading. 5/5 strength noted with bilateral hip flexion, knee flexion/extension, ankle dorsiflexion/plantarflexion and EHL. No clonus noted bilaterally. No pain upon palpation of greater trochanters. No pain with internal/external rotation of  bilateral hips. Sensation intact bilaterally. Negative slump test bilaterally. Ambulates with rolling walker, gait slow and unsteady.   Skin:    General: Skin is warm and dry.     Capillary Refill: Capillary refill takes less than 2 seconds.  Neurological:     General: No focal deficit present.     Mental Status: She is alert and oriented to person, place, and time.  Psychiatric:        Mood and Affect: Mood normal.        Behavior: Behavior normal.     Ortho Exam  Imaging: No results found.  Past Medical/Family/Surgical/Social History: Medications & Allergies reviewed per EMR, new medications updated. Patient Active Problem List   Diagnosis Date Noted   Osteoarthritis of both knees 12/27/2021   Hypercholesterolemia 12/27/2021   Elevated BUN 12/27/2021   Elevated cholesterol 11/22/2021   Elevated glucose 11/22/2021   Eating disorder 11/22/2021   Spondylolisthesis at L3-L4 level 04/25/2021   Weakness of both arms 04/25/2021   Vitamin D  deficiency 05/06/2018   Gastroesophageal reflux disease 04/13/2018   Essential hypertension 04/13/2018   Class 3 severe obesity with serious comorbidity and body mass index (BMI) of 50.0 to 59.9 in adult 04/13/2018   Derangement of posterior horn of medial meniscus of right knee 09/10/2016   Chronic pain of right knee 09/10/2016   DOE (dyspnea on exertion) 01/28/2011   Muscle weakness (generalized) 10/23/2010   Past Medical History:  Diagnosis Date   Anemia    Arthritis    bilat. knees   Back pain    Back pain    Constipation    Edema of both lower extremities    Family history of adverse reaction to anesthesia    patients mother had cardiac issues during anesthesia   Food allergy    GERD (gastroesophageal reflux disease)    Hypertension    Joint pain    Joint pain    Lactose intolerance    Rheumatoid arthritis (HCC)    Swelling of both lower extremities    Vitamin D  deficiency    Family History  Problem Relation Age of Onset    Asthma Mother    Heart disease Mother    Pancreatic cancer Mother    Ovarian cancer Mother    Diabetes Mother    Stroke Mother    High blood pressure Mother    Asthma Father    Colon cancer Father    Prostate cancer Father    Stroke Father    High blood pressure Father    Asthma Sister    Cancer Paternal Grandmother        mouth   Past Surgical History:  Procedure Laterality Date   BIOPSY  12/22/2019   Procedure: BIOPSY;  Surgeon: Golda,  Claudis PENNER, MD;  [facility];  Service: Endoscopy;;  antral   COLONOSCOPY N/A 01/06/2014   Procedure: COLONOSCOPY;  Surgeon: Claudis PENNER Rivet, MD;  [facility];  Service: Endoscopy;  Laterality: N/A;  830   COLONOSCOPY WITH PROPOFOL  N/A 12/22/2019   Procedure: COLONOSCOPY WITH PROPOFOL ;  Surgeon: Rivet Claudis PENNER, MD;  [facility];  Service: Endoscopy;  Laterality: N/A;  930   ESOPHAGOGASTRODUODENOSCOPY (EGD) WITH PROPOFOL  N/A 12/22/2019   Procedure: ESOPHAGOGASTRODUODENOSCOPY (EGD) WITH PROPOFOL ;  Surgeon: Rivet Claudis PENNER, MD;  [facility];  Service: Endoscopy;  Laterality: N/A;   FOOT SURGERY     PARTIAL HYSTERECTOMY  jan 1999   Social History   Occupational History   Occupation: Actuary: [employer]  Tobacco Use   Smoking status: Never   Smokeless tobacco: Never  Vaping Use   Vaping status: Never Used  Substance and Sexual Activity   Alcohol use: No   Drug use: Never   Sexual activity: Not on file

## 2023-12-03 ENCOUNTER — Encounter: Payer: Self-pay | Admitting: Orthopaedic Surgery

## 2023-12-03 ENCOUNTER — Ambulatory Visit: Admitting: Orthopaedic Surgery

## 2023-12-03 VITALS — Ht 62.0 in | Wt 247.0 lb

## 2023-12-03 DIAGNOSIS — Z6841 Body Mass Index (BMI) 40.0 and over, adult: Secondary | ICD-10-CM

## 2023-12-03 DIAGNOSIS — M170 Bilateral primary osteoarthritis of knee: Secondary | ICD-10-CM | POA: Diagnosis not present

## 2023-12-03 MED ORDER — HYDROCODONE-ACETAMINOPHEN 7.5-325 MG PO TABS: ORAL_TABLET | ORAL | 0 refills | Status: DC

## 2023-12-03 NOTE — Progress Notes (Signed)
 My knees still hurt.  The viscosupplementation did not help her knees that much.  She has pain with standing and after being up on them in the evenings.  She has no new trauma.  She uses a cane.  Both knees have crepitus and slight effusions.  ROM left 0 to 105, right 0 to 100, limp right today.  NV intact.  No distal edema.  Encounter Diagnoses  Name Primary?   Bilateral primary osteoarthritis of knee Yes   Body mass index 40.0-44.9, adult (HCC)    Morbid obesity (HCC)    She has lost weight and will continue to do so.  I have reviewed the [place]  Controlled Substance Reporting System web site prior to prescribing narcotic medicine for this patient.  I have informed the patient I will be retiring from medical practice and from this office on December 25, 2023.  The patient has been offered continuing care with Dr. Margrette or Dr. Onesimo of this office.  The patient may choose another provider and the records will be forwarded after proper signature and notification.  Patient understands and agrees.  Return in six weeks.  Call if any problem.  Precautions discussed.  Electronically Signed Lemond Stable, MD 9/10/20252:04 PM

## 2023-12-17 ENCOUNTER — Encounter: Payer: Self-pay | Admitting: Obstetrics & Gynecology

## 2023-12-17 ENCOUNTER — Ambulatory Visit: Admitting: Obstetrics & Gynecology

## 2023-12-17 VITALS — BP 138/86 | HR 84 | Ht 62.0 in | Wt 251.8 lb

## 2023-12-17 DIAGNOSIS — Z90711 Acquired absence of uterus with remaining cervical stump: Secondary | ICD-10-CM

## 2023-12-17 DIAGNOSIS — N3941 Urge incontinence: Secondary | ICD-10-CM

## 2023-12-17 DIAGNOSIS — R351 Nocturia: Secondary | ICD-10-CM | POA: Diagnosis not present

## 2023-12-17 NOTE — Progress Notes (Signed)
 GYN VISIT Patient name: Whitney Bauer MRN [id number]  Date of birth: 02-Nov-1955 Chief Complaint:   Urinary Incontinence  History of Present Illness:   Whitney Bauer is a 68 y.o. G0P0000 PM, PH female being seen today for the following concerns:  -h/o hysterectomy due to uterine fbroids and bleeding   Note urge incontinence at night time.  Denies urgency or frequency.   Typically up 4-5x per night to void.  Denies urge or stress incontinence during the day.  It sounds as though at night she will wake up either because she has to void or she has already started to void.  With further review of her history, it sounds as though she drinks tea with supper which is soon before she goes to bed.  She does note that she drinks tea during the day and does not seem to have this issue  No LMP recorded. Patient has had a hysterectomy.    Review of Systems:   Pertinent items are noted in HPI Denies fever/chills, dizziness, headaches, visual disturbances, fatigue, shortness of breath, chest pain, abdominal pain, vomiting. Pertinent History Reviewed:   Past Surgical History:  Procedure Laterality Date   BIOPSY  12/22/2019   Procedure: BIOPSY;  Surgeon: Golda Claudis PENNER, MD;  [facility];  Service: Endoscopy;;  antral   COLONOSCOPY N/A 01/06/2014   Procedure: COLONOSCOPY;  Surgeon: Claudis PENNER Golda, MD;  [facility];  Service: Endoscopy;  Laterality: N/A;  830   COLONOSCOPY WITH PROPOFOL  N/A 12/22/2019   Procedure: COLONOSCOPY WITH PROPOFOL ;  Surgeon: Golda Claudis PENNER, MD;  [facility];  Service: Endoscopy;  Laterality: N/A;  930   ESOPHAGOGASTRODUODENOSCOPY (EGD) WITH PROPOFOL  N/A 12/22/2019   Procedure: ESOPHAGOGASTRODUODENOSCOPY (EGD) WITH PROPOFOL ;  Surgeon: Golda Claudis PENNER, MD;  [facility];  Service: Endoscopy;  Laterality: N/A;   FOOT SURGERY     PARTIAL HYSTERECTOMY  jan 1999    Past Medical History:  Diagnosis Date   Anemia    Arthritis     bilat. knees   Back pain    Back pain    Constipation    Edema of both lower extremities    Family history of adverse reaction to anesthesia    patients mother had cardiac issues during anesthesia   Food allergy    GERD (gastroesophageal reflux disease)    Hypertension    Joint pain    Joint pain    Lactose intolerance    Rheumatoid arthritis (HCC)    Swelling of both lower extremities    Vitamin D  deficiency    Reviewed problem list, medications and allergies. Physical Assessment:   Vitals:   12/17/23 1026  BP: 138/86  Pulse: 84  Weight: 251 lb 12.8 oz (114.2 kg)  Height: 5' 2 (1.575 m)  Body mass index is 46.05 kg/m.       Physical Examination:   General appearance: alert, well appearing, and in no distress  Psych: mood appropriate, normal affect  Skin: warm & dry   Cardiovascular: normal heart rate noted  Respiratory: normal respiratory effort, no distress  Abdomen: soft, non-tender   Pelvic: exam declined by the patient  Extremities: 1+ edema, no calf tenderness bilaterally  Chaperone: N/A    Assessment & Plan:  1) Nocturia urge incontinence - Based on clinical history suspect symptoms are likely due to caffeine intake prior to bedtime - Patient to cut back on tea and monitor symptoms - Patient is already working  on weight management which may help as well - Questions and concerns were addressed  2) preventive screening - Reassured patient that hysterectomy is not indicated    No orders of the defined types were placed in this encounter.   Return if symptoms worsen or fail to improve.   Natoya Viscomi, DO Attending Obstetrician & Gynecologist, [facility] for [employer], [facility]

## 2024-01-15 ENCOUNTER — Ambulatory Visit: Admitting: Orthopedic Surgery

## 2024-01-15 VITALS — Ht 62.0 in | Wt 240.0 lb

## 2024-01-15 DIAGNOSIS — M170 Bilateral primary osteoarthritis of knee: Secondary | ICD-10-CM | POA: Diagnosis not present

## 2024-01-15 NOTE — Progress Notes (Signed)
   Patient: Whitney Bauer           Date of Birth: 02-17-1956           MRN: [id number] Visit Date: 01/15/2024 Requested by: Sheryle Carwin, MD [address] [place],  [place] [address] PCP: Sheryle Carwin, MD  Encounter Diagnosis  Name Primary?   Bilateral primary osteoarthritis of knee Yes    Assessment and plan:  This is a 68 year old female with a valgus knee with end-stage arthritis she has a flexion contracture.  Her BMI is over 40.  She is trying to lose weight.  We discussed that she has a skin fold which may require plastic surgery consult prior to surgery  We would like her weight to be 215 that gets her BMI under 40 and we should be able to proceed with total knee arthroplasty  Currently takes hydrocodone  for pain, I do not see any reason to send her to pain management at this point.  She can continue her prescriptions here for now.  This will have to be taken care of after surgery with a weaning process and then a pain management referral.  High likelihood of continued opioid use even after knee replacement surgery.  No orders of the defined types were placed in this encounter.    Chief Complaint  Patient presents with   Knee Pain    Both     History:  68 year old female with bilateral knee pain from osteoarthritis she has severe disease with valgus deformity which is worsening.  She has stiffness and pain in the left knee she requires a cane for gait  Her BMI is over 40  Past Medical History:  Diagnosis Date   Anemia    Arthritis    bilat. knees   Back pain    Back pain    Constipation    Edema of both lower extremities    Family history of adverse reaction to anesthesia    patients mother had cardiac issues during anesthesia   Food allergy    GERD (gastroesophageal reflux disease)    Hypertension    Joint pain    Joint pain    Lactose intolerance    Rheumatoid arthritis (HCC)    Swelling of both lower extremities    Vitamin D  deficiency       Focused exam findings:  Left knee skin fold flaps over the proposed incision site.  Lateral compartment tenderness with a valgus knee flexion contracture 10 degrees flexion 90 degrees  No results found.

## 2024-01-15 NOTE — Progress Notes (Signed)
    01/15/2024   Chief Complaint  Patient presents with   Knee Pain    Both     No diagnosis found.  What pharmacy do you use ? [facility]________  DOI/DOS/ Date:    Unchanged  Working on [employer] down 10 lbs since last weight check and BMI 43.9

## 2024-01-16 ENCOUNTER — Telehealth: Payer: Self-pay

## 2024-01-16 NOTE — Telephone Encounter (Signed)
 Patient is asking if Dr. Margrette will please write her a letter to excuse her from jury duty because of her knees.  Please call and advise [phone]

## 2024-01-26 ENCOUNTER — Encounter: Payer: Self-pay | Admitting: Radiology

## 2024-04-01 ENCOUNTER — Ambulatory Visit: Admitting: Orthopedic Surgery

## 2024-04-01 VITALS — Ht 62.0 in | Wt 250.0 lb

## 2024-04-01 DIAGNOSIS — G8929 Other chronic pain: Secondary | ICD-10-CM | POA: Diagnosis not present

## 2024-04-01 DIAGNOSIS — M25561 Pain in right knee: Secondary | ICD-10-CM

## 2024-04-01 DIAGNOSIS — M25562 Pain in left knee: Secondary | ICD-10-CM | POA: Diagnosis not present

## 2024-04-01 DIAGNOSIS — M170 Bilateral primary osteoarthritis of knee: Secondary | ICD-10-CM

## 2024-04-01 DIAGNOSIS — Z6841 Body Mass Index (BMI) 40.0 and over, adult: Secondary | ICD-10-CM | POA: Diagnosis not present

## 2024-04-01 MED ORDER — HYDROCODONE-ACETAMINOPHEN 5-325 MG PO TABS: 1.0000 | ORAL_TABLET | Freq: Four times a day (QID) | ORAL | 0 refills | Status: AC | PRN

## 2024-04-01 NOTE — Progress Notes (Signed)
" ° ° °  04/01/2024   Chief Complaint  Patient presents with   Knee Pain    Both, injections have not been helpful    Foot Problem    States feet are swollen today     No diagnosis found.  What pharmacy do you use ? _____[facility]_____  DOI/DOS/ Date:    Did you get better, worse or no change (Answer below)   Unchanged/ injections not helping anymore       "

## 2024-04-01 NOTE — Progress Notes (Addendum)
 FOLLOW-UP OFFICE VISIT   Patient: Whitney Bauer           Date of Birth: 03/20/1956           MRN: [id number] Visit Date: 04/01/2024 Requested by: Whitney Carwin, MD [address] [place],  [place] [address] PCP: Whitney Carwin, MD    Encounter Diagnoses  Name Primary?   Bilateral primary osteoarthritis of knee Yes   Chronic pain of right knee    Chronic pain of left knee    Morbid obesity (HCC)    Body mass index 45.0-49.9, adult [facility])     Chief Complaint  Patient presents with   Knee Pain    Both, injections have not been helpful    Foot Problem    States feet are swollen today     Whitney Bauer comes back to us  with actual weight gain.  She is trying to lose weight and says she overate doing the holidays.  Her current BMI is 45.7 and with her current regiment of Zepbound previously on Wegovy she should get some weight loss soon.  Her last injections did not help.  We had decided to put her on opioids as we really did not have any other way to control her pain  We will continue chronic opioid therapy on her for now and when she gets her BMI in the 40-38 range maybe we can do the surgery.  The adipose tissue around her knee could pose a problem as some of the tissue overlaps which can lead to maceration moisture and postop wound infection  ASSESSMENT AND PLAN As above  Meds ordered this encounter  Medications   HYDROcodone -acetaminophen  (NORCO/VICODIN) 5-325 MG tablet    Sig: Take 1 tablet by mouth every 6 (six) hours as needed for moderate pain (pain score 4-6).    Dispense:  30 tablet    Refill:  0

## 2024-04-29 ENCOUNTER — Ambulatory Visit: Admitting: Orthopedic Surgery

## 2024-05-13 ENCOUNTER — Ambulatory Visit: Admitting: Orthopedic Surgery
